# Patient Record
Sex: Male | Born: 1945 | Race: Black or African American | Hispanic: No | Marital: Single | Smoking: Former smoker
Health system: Southern US, Community
[De-identification: ages and names within clinical notes are randomized; demographics above are authoritative.]

## PROBLEM LIST (undated history)

## (undated) DIAGNOSIS — G459 Transient cerebral ischemic attack, unspecified: Secondary | ICD-10-CM

## (undated) DIAGNOSIS — Z21 Asymptomatic human immunodeficiency virus [HIV] infection status: Secondary | ICD-10-CM

## (undated) DIAGNOSIS — Z950 Presence of cardiac pacemaker: Secondary | ICD-10-CM

## (undated) DIAGNOSIS — C9000 Multiple myeloma not having achieved remission: Secondary | ICD-10-CM

## (undated) DIAGNOSIS — E785 Hyperlipidemia, unspecified: Secondary | ICD-10-CM

## (undated) DIAGNOSIS — R7303 Prediabetes: Secondary | ICD-10-CM

## (undated) DIAGNOSIS — B20 Human immunodeficiency virus [HIV] disease: Secondary | ICD-10-CM

## (undated) HISTORY — PX: CERVICAL LAMINECTOMY: SHX94

## (undated) HISTORY — DX: Multiple myeloma not having achieved remission: C90.00

## (undated) HISTORY — PX: HERNIA REPAIR: SHX51

## (undated) HISTORY — DX: Asymptomatic human immunodeficiency virus (hiv) infection status: Z21

## (undated) HISTORY — DX: Hyperlipidemia, unspecified: E78.5

## (undated) HISTORY — PX: MEDIPORT INSERTION, SINGLE: SHX2027

## (undated) HISTORY — PX: INGUINAL HERNIA REPAIR: SUR1180

## (undated) HISTORY — PX: UMBILICAL HERNIA REPAIR: SHX196

## (undated) HISTORY — DX: Human immunodeficiency virus (HIV) disease: B20

## (undated) HISTORY — PX: INSERT / REPLACE / REMOVE PACEMAKER: SUR710

---

## 2014-05-23 ENCOUNTER — Telehealth: Payer: Self-pay | Admitting: Oncology

## 2014-05-23 NOTE — Telephone Encounter (Signed)
Left pt vm in ref to np appt. °

## 2014-05-23 NOTE — Telephone Encounter (Signed)
C/D 05/23/14 for appt. 05/29/14

## 2014-05-23 NOTE — Telephone Encounter (Signed)
S/W PT IN REF TO NP APPT. ON 05/29/14@1 :30 REFERRING  DR. Nyoka Cowden DX-MULTIPLE MYELOMA MAILED NP PACKET

## 2014-05-25 ENCOUNTER — Other Ambulatory Visit: Payer: Self-pay | Admitting: Oncology

## 2014-05-25 DIAGNOSIS — D729 Disorder of white blood cells, unspecified: Secondary | ICD-10-CM

## 2014-05-29 ENCOUNTER — Telehealth: Payer: Self-pay | Admitting: Oncology

## 2014-05-29 ENCOUNTER — Ambulatory Visit: Payer: MEDICARE

## 2014-05-29 ENCOUNTER — Encounter: Payer: Self-pay | Admitting: Oncology

## 2014-05-29 ENCOUNTER — Encounter (INDEPENDENT_AMBULATORY_CARE_PROVIDER_SITE_OTHER): Payer: Self-pay

## 2014-05-29 ENCOUNTER — Other Ambulatory Visit (HOSPITAL_BASED_OUTPATIENT_CLINIC_OR_DEPARTMENT_OTHER): Payer: BC Managed Care – PPO

## 2014-05-29 ENCOUNTER — Ambulatory Visit (HOSPITAL_BASED_OUTPATIENT_CLINIC_OR_DEPARTMENT_OTHER): Payer: BC Managed Care – PPO | Admitting: Oncology

## 2014-05-29 VITALS — BP 112/65 | HR 84 | Temp 97.9°F | Resp 18 | Ht 69.0 in | Wt 143.6 lb

## 2014-05-29 DIAGNOSIS — D729 Disorder of white blood cells, unspecified: Secondary | ICD-10-CM

## 2014-05-29 DIAGNOSIS — Z21 Asymptomatic human immunodeficiency virus [HIV] infection status: Secondary | ICD-10-CM

## 2014-05-29 DIAGNOSIS — C9000 Multiple myeloma not having achieved remission: Secondary | ICD-10-CM

## 2014-05-29 LAB — CBC WITH DIFFERENTIAL/PLATELET
BASO%: 0.6 % (ref 0.0–2.0)
Basophils Absolute: 0 10*3/uL (ref 0.0–0.1)
EOS%: 0.6 % (ref 0.0–7.0)
Eosinophils Absolute: 0 10*3/uL (ref 0.0–0.5)
HCT: 36 % — ABNORMAL LOW (ref 38.4–49.9)
HGB: 12.2 g/dL — ABNORMAL LOW (ref 13.0–17.1)
LYMPH#: 2.2 10*3/uL (ref 0.9–3.3)
LYMPH%: 65.2 % — ABNORMAL HIGH (ref 14.0–49.0)
MCH: 29 pg (ref 27.2–33.4)
MCHC: 33.9 g/dL (ref 32.0–36.0)
MCV: 85.7 fL (ref 79.3–98.0)
MONO#: 0.4 10*3/uL (ref 0.1–0.9)
MONO%: 12.7 % (ref 0.0–14.0)
NEUT#: 0.7 10*3/uL — ABNORMAL LOW (ref 1.5–6.5)
NEUT%: 20.9 % — ABNORMAL LOW (ref 39.0–75.0)
NRBC: 0 % (ref 0–0)
Platelets: 158 10*3/uL (ref 140–400)
RBC: 4.2 10*6/uL (ref 4.20–5.82)
RDW: 12.7 % (ref 11.0–14.6)
WBC: 3.4 10*3/uL — AB (ref 4.0–10.3)

## 2014-05-29 LAB — COMPREHENSIVE METABOLIC PANEL (CC13)
ALBUMIN: 3.5 g/dL (ref 3.5–5.0)
ALT: 13 U/L (ref 0–55)
AST: 22 U/L (ref 5–34)
Alkaline Phosphatase: 59 U/L (ref 40–150)
Anion Gap: 6 mEq/L (ref 3–11)
BUN: 7.9 mg/dL (ref 7.0–26.0)
CO2: 27 mEq/L (ref 22–29)
CREATININE: 0.9 mg/dL (ref 0.7–1.3)
Calcium: 9.4 mg/dL (ref 8.4–10.4)
Chloride: 107 mEq/L (ref 98–109)
Glucose: 116 mg/dl (ref 70–140)
Potassium: 3.8 mEq/L (ref 3.5–5.1)
SODIUM: 140 meq/L (ref 136–145)
Total Bilirubin: 2.2 mg/dL — ABNORMAL HIGH (ref 0.20–1.20)
Total Protein: 6 g/dL — ABNORMAL LOW (ref 6.4–8.3)

## 2014-05-29 NOTE — Progress Notes (Signed)
Please see consult note.  

## 2014-05-29 NOTE — Consult Note (Signed)
Reason for Referral: Multiple myeloma.   HPI: This is a pleasant 68 year old gentleman native of the state in [place] but lived in the [place] of the majority of his life. Most recently he relocated to this area to be close to family members. He has a past medical history significant for HIV and a diagnosis of multiple myeloma dating back to 2011. His HIV has been under control with undetectable viral load and a CD4 count of 605 by last testing in 2013. His history of myeloma dates back to October of 2011 where he presented with lytic lesions after an episode of chest pain. His bone marrow biopsy in October 2011 showed plasma cell disorder that is Restricted with 84% involvement. His Levels at that time were 380 with a lambda level of 0.69. He is Out to lambda  ratio is 551. He was initially treated with Velcade-based regimen for unknown duration and subsequently established care with another oncologist in [place] area. In April 2013 his staging workup showed a kappa light chain of 26.5 and a normal quantitative immunoglobulins. A repeat bone marrow biopsy in May 2013 showed 4% residual disease. He was observed given his excellent response to low March of 2014 where he presented with an elevated free kappa light chain of 435 and a repeat bone marrow biopsy showed 61% plasma cells. At that time, he was treated with Velcade Revlimid and dexamethasone starting on April 2014. He had significant improvement in his symptoms and his free kappa light chain drop 2119 after the first cycle. In subsequent cycles his kappa free light chain drop 2 close to 50 mg/dL he did not tolerate this combination very well and was switched to Revlimid monotherapy at a lower dose of 50 mg daily for 21 days. He continues not to have toxicities predominantly diarrhea and fatigue and his most recent kappa free light chain was around 70 mg/dL. His oncologist at that time decided to proceed with maintenance Revlimid and the  patient decided to relocate to this area. He had established care with primary care physician and he has a referral to infectious disease.  Clinically he is asymptomatic at this time. He does not report any headaches or blurry vision or double vision. Does not report any syncope. He is not reporting any fevers or chills or sweats. Does not report any weight loss or appetite changes. He does not report any nausea or vomiting or abdominal pain. Does not report any diarrhea hematochezia or melena. Does not report any chest pain palpitation or shortness of breath. 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 He does not report any frequency urgency or hesitancy. Does not report any hematuria or dysuria. He does not report any skeletal complaints. He does report chronic back pain related to his previous laminectomy. He does not report any arthralgias. She does not report any issues or lymphadenopathy. Overall he has excellent performance status and quality of life.   Past Medical History  Diagnosis Date  . Multiple myeloma 2011  . HIV infection   . Hyperlipidemia   :  Past Surgical History  Procedure Laterality Date  . Neck surgery    . Pace maker    :  Current Outpatient Prescriptions  Medication Sig Dispense Refill  . atazanavir (REYATAZ) 300 MG capsule Take 300 mg by mouth daily with breakfast.      . clopidogrel (PLAVIX) 75 MG tablet Take 75 mg by mouth daily.      Marland Kitchen  emtricitabine-tenofovir (TRUVADA) 200-300 MG per tablet Take 1 tablet by mouth daily.      Marland Kitchen HYDROcodone-acetaminophen (NORCO) 10-325 MG per tablet Take 1 tablet by mouth every 6 (six) hours as needed.      . ritonavir (NORVIR) 100 MG TABS tablet Take 100 mg by mouth daily with breakfast.       No current facility-administered medications for this visit.       No Known Allergies:   his family history reviewed with the patient and no history of malignancy or blood disorders.   History   Social  History  . Marital Status: Single    Spouse Name: N/A    Number of Children: N/A  . Years of Education: N/A   Occupational History  . Not on file.   Social History Main Topics  . Smoking status: Former Smoker    Types: Cigarettes  . Smokeless tobacco: Not on file  . Alcohol Use: Yes  . Drug Use: Not on file  . Sexual Activity: Not on file   Other Topics Concern  . Not on file   Social History Narrative  . No narrative on file  :  Pertinent items are noted in HPI.  Exam: ECOG 0 Blood pressure 112/65, pulse 84, temperature 97.9 F (36.6 C), temperature source Oral, resp. rate 18, height 5' 9"  (1.753 m), weight 143 lb 9.6 oz (65.137 kg). General appearance: alert, cooperative and appears stated age Head: Normocephalic, without obvious abnormality Throat: lips, mucosa, and tongue normal; teeth and gums normal Neck: no adenopathy Back: symmetric, no curvature. ROM normal. No CVA tenderness. Resp: clear to auscultation bilaterally Chest wall: no tenderness Cardio: regular rate and rhythm, S1, S2 normal, no murmur, click, rub or gallop GI: soft, non-tender; bowel sounds normal; no masses,  no organomegaly Extremities: extremities normal, atraumatic, no cyanosis or edema Pulses: 2+ and symmetric Skin: Skin color, texture, turgor normal. No rashes or lesions Lymph nodes: Cervical, supraclavicular, and axillary nodes normal. Neurologic: Grossly normal   Recent Labs  05/29/14 1320  WBC 3.4*  HGB 12.2*  HCT 36.0*  PLT 158    Recent Labs  05/29/14 1320  NA 140  K 3.8  CO2 27  GLUCOSE 116  BUN 7.9  CREATININE 0.9  CALCIUM 9.4     No results found.  Assessment and Plan:    67 year old gentleman with the following issues:  1. 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 He presented with lytic bone lesions and a bone marrow involvement with 84% plasma cells. His kappa free light chain was around 318. He does not have an M spike or heavy  chain detected at any point. He was treated in the [facility] area initially with a Velcade-based regimen and had an excellent response with about 4% bone marrow residual disease. In April of 2014 he developed relapsed and was treated with Revlimid, Velcade and dexamethasone regimen. He had an excellent response with his kappa free light chain close to 60 mg/dL. He is planned to have maintenance Velcade treatment even his poor tolerance to Revlimid. He recently relocated to [place] to be close to family.  The natural course of this disease was discussed with this gentleman. I explained to him that this is an incurable malignancy but certainly treatable at this time. Given the fact that he does not have any symptoms and have very good partial response with the previous regimen options of treatments were discussed today. We can use to Revlimid base  maintenance, a Velcade base maintenance or leave him off therapy altogether and treat upon recurrence. 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  The plan will be at this point is to put him on close observation and surveillance and if his light chain start to rise or he develops symptoms then we'll restage him at that time and will use reinduction regimen at that time. This can be Velcade or equivalent. I will assess him in 6 weeks and recheck his light chains at that time.  2. Bone directed therapy: He has had extensive Zometa for the last 2 years and I will hold off on any further bone directed therapy unless he have developed new lytic bone lesions.  3. History of HIV: He is currently on antiretrovirals medications and he will establish care with infectious disease in the near future.

## 2014-05-29 NOTE — Progress Notes (Signed)
Checked in new patient with no financial issues. He has appt card and has not been out of the country.

## 2014-05-29 NOTE — Telephone Encounter (Signed)
gv and printed appt sched and avs for pt for Aug °

## 2014-06-03 LAB — SPEP & IFE WITH QIG
ALBUMIN ELP: 65.8 % (ref 55.8–66.1)
ALPHA-1-GLOBULIN: 3.9 % (ref 2.9–4.9)
Alpha-2-Globulin: 8.8 % (ref 7.1–11.8)
Beta 2: 3.5 % (ref 3.2–6.5)
Beta Globulin: 6.6 % (ref 4.7–7.2)
Gamma Globulin: 11.4 % (ref 11.1–18.8)
IGM, SERUM: 12 mg/dL — AB (ref 41–251)
IgA: 34 mg/dL — ABNORMAL LOW (ref 68–379)
IgG (Immunoglobin G), Serum: 784 mg/dL (ref 650–1600)
M-Spike, %: 0.14 g/dL
Total Protein, Serum Electrophoresis: 5.9 g/dL — ABNORMAL LOW (ref 6.0–8.3)

## 2014-06-03 LAB — KAPPA/LAMBDA LIGHT CHAINS
KAPPA LAMBDA RATIO: 86.6 — AB (ref 0.26–1.65)
Kappa free light chain: 89.2 mg/dL — ABNORMAL HIGH (ref 0.33–1.94)
Lambda Free Lght Chn: 1.03 mg/dL (ref 0.57–2.63)

## 2014-06-11 ENCOUNTER — Ambulatory Visit (INDEPENDENT_AMBULATORY_CARE_PROVIDER_SITE_OTHER): Payer: MEDICARE

## 2014-06-11 DIAGNOSIS — B20 Human immunodeficiency virus [HIV] disease: Secondary | ICD-10-CM

## 2014-06-11 DIAGNOSIS — Z21 Asymptomatic human immunodeficiency virus [HIV] infection status: Secondary | ICD-10-CM

## 2014-06-11 DIAGNOSIS — Z23 Encounter for immunization: Secondary | ICD-10-CM

## 2014-06-11 DIAGNOSIS — C9000 Multiple myeloma not having achieved remission: Secondary | ICD-10-CM

## 2014-06-11 LAB — COMPLETE METABOLIC PANEL WITH GFR
ALBUMIN: 4.4 g/dL (ref 3.5–5.2)
ALT: 15 U/L (ref 0–53)
AST: 24 U/L (ref 0–37)
Alkaline Phosphatase: 61 U/L (ref 39–117)
BILIRUBIN TOTAL: 1.6 mg/dL — AB (ref 0.2–1.2)
BUN: 8 mg/dL (ref 6–23)
CHLORIDE: 105 meq/L (ref 96–112)
CO2: 29 mEq/L (ref 19–32)
Calcium: 9.8 mg/dL (ref 8.4–10.5)
Creat: 0.81 mg/dL (ref 0.50–1.35)
GFR, Est African American: >89 mL/min
Glucose, Bld: 107 mg/dL — ABNORMAL HIGH (ref 70–99)
POTASSIUM: 3.9 meq/L (ref 3.5–5.3)
SODIUM: 141 meq/L (ref 135–145)
TOTAL PROTEIN: 6.3 g/dL (ref 6.0–8.3)

## 2014-06-11 LAB — LIPID PANEL
Cholesterol: 201 mg/dL — ABNORMAL HIGH (ref 0–200)
HDL: 59 mg/dL (ref >39)
LDL Cholesterol: 124 mg/dL — ABNORMAL HIGH (ref 0–99)
Total CHOL/HDL Ratio: 3.4 Ratio
Triglycerides: 90 mg/dL (ref <150)
VLDL: 18 mg/dL (ref 0–40)

## 2014-06-11 MED ORDER — ATAZANAVIR SULFATE 300 MG PO CAPS: 300.0000 mg | ORAL_CAPSULE | Freq: Every day | ORAL | Status: DC

## 2014-06-11 MED ORDER — EMTRICITABINE-TENOFOVIR DF 200-300 MG PO TABS: 1.0000 | ORAL_TABLET | Freq: Every day | ORAL | Status: DC

## 2014-06-11 MED ORDER — RITONAVIR 100 MG PO TABS: 100.0000 mg | ORAL_TABLET | Freq: Every day | ORAL | Status: DC

## 2014-06-11 NOTE — Patient Instructions (Signed)
Please call the office at [phone] on 06/13/14 to give [place] the results of your TB skin test

## 2014-06-11 NOTE — Progress Notes (Signed)
68 year old AA male here for new HIV intake, he is transferring from [facility] where he received care. Patient has currently been on Norvir, Truvada, and Reyataz for the past 10 years. His last CD 4 was 499 and viral load was undetectable. Patient is retired and lives in [place], and reports taking his medication everyday. He has several health problems that he has established a primary care doctor to address, he was seen at [facility] in June. Patient was concerned today about running out of medication, he states he only had 1 week left. I called a refill in to his pharmacy. Patient's vaccine was updated and records received. Patient does not have any tattoos, but has 1 ear piercing.

## 2014-06-12 LAB — CBC WITH DIFFERENTIAL/PLATELET
BASOS ABS: 0 10*3/uL (ref 0.0–0.1)
Basophils Relative: 1 % (ref 0–1)
EOS ABS: 0.1 10*3/uL (ref 0.0–0.7)
EOS PCT: 2 % (ref 0–5)
HCT: 36.8 % — ABNORMAL LOW (ref 39.0–52.0)
HEMOGLOBIN: 12.8 g/dL — AB (ref 13.0–17.0)
Lymphocytes Relative: 65 % — ABNORMAL HIGH (ref 12–46)
Lymphs Abs: 1.8 10*3/uL (ref 0.7–4.0)
MCH: 28.8 pg (ref 26.0–34.0)
MCHC: 34.8 g/dL (ref 30.0–36.0)
MCV: 82.7 fL (ref 78.0–100.0)
MONOS PCT: 13 % — AB (ref 3–12)
Monocytes Absolute: 0.4 10*3/uL (ref 0.1–1.0)
Neutro Abs: 0.5 10*3/uL — ABNORMAL LOW (ref 1.7–7.7)
Neutrophils Relative %: 19 % — ABNORMAL LOW (ref 43–77)
PLATELETS: 156 10*3/uL (ref 150–400)
RBC: 4.45 MIL/uL (ref 4.22–5.81)
RDW: 13.9 % (ref 11.5–15.5)
WBC: 2.7 10*3/uL — AB (ref 4.0–10.5)

## 2014-06-12 LAB — URINALYSIS
BILIRUBIN URINE: NEGATIVE
Glucose, UA: NEGATIVE mg/dL
Hgb urine dipstick: NEGATIVE
Ketones, ur: NEGATIVE mg/dL
Leukocytes, UA: NEGATIVE
NITRITE: NEGATIVE
PH: 6 (ref 5.0–8.0)
Protein, ur: NEGATIVE mg/dL
Specific Gravity, Urine: 1.013 (ref 1.005–1.030)
Urobilinogen, UA: 1 mg/dL (ref 0.0–1.0)

## 2014-06-12 LAB — T-HELPER CELL (CD4) - (RCID CLINIC ONLY)
CD4 T CELL HELPER: 20 % — AB (ref 33–55)
CD4 T Cell Abs: 370 /uL — ABNORMAL LOW (ref 400–2700)

## 2014-06-12 LAB — RPR

## 2014-06-12 LAB — HEPATITIS B CORE ANTIBODY, TOTAL: HEP B C TOTAL AB: REACTIVE — AB

## 2014-06-12 LAB — HEPATITIS C ANTIBODY: HCV Ab: NEGATIVE

## 2014-06-12 LAB — HEPATITIS B SURFACE ANTIGEN: Hepatitis B Surface Ag: NEGATIVE

## 2014-06-12 LAB — PATHOLOGIST SMEAR REVIEW

## 2014-06-12 LAB — HEPATITIS A ANTIBODY, TOTAL: Hep A Total Ab: REACTIVE — AB

## 2014-06-12 LAB — HEPATITIS B SURFACE ANTIBODY,QUALITATIVE: HEP B S AB: POSITIVE — AB

## 2014-06-13 LAB — HIV-1 RNA ULTRAQUANT REFLEX TO GENTYP+
HIV 1 RNA Quant: <20 copies/mL (ref <20)
HIV-1 RNA Quant, Log: <1.3 {Log} (ref <1.30)

## 2014-06-14 LAB — TB SKIN TEST
Induration: 0 mm
TB Skin Test: NEGATIVE

## 2014-06-17 LAB — HLA B*5701: HLA-B*5701 w/rflx HLA-B High: NEGATIVE

## 2014-07-01 ENCOUNTER — Encounter: Payer: Self-pay | Admitting: Internal Medicine

## 2014-07-01 ENCOUNTER — Ambulatory Visit (INDEPENDENT_AMBULATORY_CARE_PROVIDER_SITE_OTHER): Payer: BC Managed Care – PPO | Admitting: Internal Medicine

## 2014-07-01 VITALS — BP 115/76 | HR 102 | Temp 98.2°F | Ht 69.0 in | Wt 143.8 lb

## 2014-07-01 DIAGNOSIS — B20 Human immunodeficiency virus [HIV] disease: Secondary | ICD-10-CM | POA: Insufficient documentation

## 2014-07-01 DIAGNOSIS — IMO0002 Reserved for concepts with insufficient information to code with codable children: Secondary | ICD-10-CM | POA: Insufficient documentation

## 2014-07-01 DIAGNOSIS — M1990 Unspecified osteoarthritis, unspecified site: Secondary | ICD-10-CM | POA: Insufficient documentation

## 2014-07-01 DIAGNOSIS — C9002 Multiple myeloma in relapse: Secondary | ICD-10-CM

## 2014-07-01 DIAGNOSIS — Z9889 Other specified postprocedural states: Secondary | ICD-10-CM

## 2014-07-01 DIAGNOSIS — G459 Transient cerebral ischemic attack, unspecified: Secondary | ICD-10-CM | POA: Insufficient documentation

## 2014-07-01 DIAGNOSIS — Z950 Presence of cardiac pacemaker: Secondary | ICD-10-CM | POA: Insufficient documentation

## 2014-07-01 NOTE — Progress Notes (Signed)
Rfv: establishing care transfer from out of state Subjective:    Patient ID: Rick Potter, male    DOB: [birth year], 68 y.o.   MRN: [id number]  HPI 68yo M with HIV disease dx in 1990, well controlled, 370/VL<20 on truvada-boosted atazanavir regimen which he has been for roughly 5 years. Immune hep A and hep B. He is in good state of health despite in MM. Presently asymptomatic from MM. He does have some arthritis. He reports being under good control of his hiv disease. He is interested in going down to 1 pill per day regimen   MM, in 0ct 2011, previously on velcade, zometa monthy, which he did not tolerate. Just establishing care with Dr. Alen Blew for MM and determining when to initaite treatment. No Known Allergies Current Outpatient Prescriptions on File Prior to Visit  Medication Sig Dispense Refill  . atazanavir (REYATAZ) 300 MG capsule Take 1 capsule (300 mg total) by mouth daily with breakfast.  30 capsule  0  . clopidogrel (PLAVIX) 75 MG tablet Take 75 mg by mouth daily.      Marland Kitchen emtricitabine-tenofovir (TRUVADA) 200-300 MG per tablet Take 1 tablet by mouth daily.  30 tablet  0  . HYDROcodone-acetaminophen (NORCO) 10-325 MG per tablet Take 1 tablet by mouth every 6 (six) hours as needed.      . ritonavir (NORVIR) 100 MG TABS tablet Take 1 tablet (100 mg total) by mouth daily with breakfast.  30 tablet  0   No current facility-administered medications on file prior to visit.   Active Ambulatory Problems    Diagnosis Date Noted  . No Active Ambulatory Problems   Resolved Ambulatory Problems    Diagnosis Date Noted  . No Resolved Ambulatory Problems   Past Medical History  Diagnosis Date  . Multiple myeloma 2011  . HIV infection   . Hyperlipidemia    History  Substance Use Topics  . Smoking status: Former Smoker    Types: Cigarettes    Quit date: 12/06/1997  . Smokeless tobacco: Not on file  . Alcohol Use: No  family history includes Alcohol abuse in his brother; Cancer in  his sister; Diabetes in his brother; Stroke in his mother. brother that died of AIDS at age of 64. Sister with meylomas.    Review of Systems Review of Systems  Constitutional: Negative for fever, chills, diaphoresis, activity change, appetite change, fatigue and unexpected weight change.  HENT: Negative for congestion, sore throat, rhinorrhea, sneezing, trouble swallowing and sinus pressure.  Eyes: Negative for photophobia and visual disturbance.  Respiratory: Negative for cough, chest tightness, shortness of breath, wheezing and stridor.  Cardiovascular: Negative for chest pain, palpitations and leg swelling.  Gastrointestinal: Negative for nausea, vomiting, abdominal pain, diarrhea, constipation, blood in stool, abdominal distention and anal bleeding.  Genitourinary: Negative for dysuria, hematuria, flank pain and difficulty urinating.  Musculoskeletal: Negative for myalgias, back pain, joint swelling, arthralgias and gait problem.  Skin: Negative for color change, pallor, rash and wound.  Neurological: Negative for dizziness, tremors, weakness and light-headedness.  Hematological: Negative for adenopathy. Does not bruise/bleed easily.  Psychiatric/Behavioral: Negative for behavioral problems, confusion, sleep disturbance, dysphoric mood, decreased concentration and agitation.       Objective:   Physical Exam BP 115/76  Pulse 102  Temp(Src) 98.2 F (36.8 C) (Oral)  Ht 5' 9"  (1.753 m)  Wt 143 lb 12 oz (65.205 kg)  BMI 21.22 kg/m2 Physical Exam  Constitutional: He is oriented to person, place, and time. He appears well-developed  and well-nourished. No distress.  HENT:  Mouth/Throat: Oropharynx is clear and moist. No oropharyngeal exudate.  Cardiovascular: Normal rate, regular rhythm and normal heart sounds. Exam reveals no gallop and no friction rub.  No murmur heard.  Pulmonary/Chest: Effort normal and breath sounds normal. No respiratory distress. He has no wheezes.  Abdominal:  Soft. Bowel sounds are normal. He exhibits no distension. There is no tenderness.  Lymphadenopathy:  He has no cervical adenopathy.  Neurological: He is alert and oriented to person, place, and time.  Skin: Skin is warm and dry. No rash noted. No erythema.  Psychiatric: He has a normal mood and affect. His behavior is normal.       Assessment & Plan:  hiv = well controlled. Potentially could do stribild. He denies any use of the Integrase Inhibitors. He has excellent adherence. We will check his old genotypes from his records to see if can make the switch. One other alternative is to switch him truvada plus evotaz or prezcobix.  Multiple myeloma = he has appt with dr. Alen Blew on the 7th. We will see what comes of that appt and if he were to resume any chemo treatment. He does have detectable free kappa light chains on latest testing but unclear if it meets threshold for treatment.  Health prevention = we will see if he has recent colonoscopy  Spent 45 min with patient with greater than 50% review records  rtc in 6 wk

## 2014-07-12 ENCOUNTER — Encounter: Payer: Self-pay | Admitting: Oncology

## 2014-07-12 ENCOUNTER — Other Ambulatory Visit (HOSPITAL_BASED_OUTPATIENT_CLINIC_OR_DEPARTMENT_OTHER): Payer: BC Managed Care – PPO

## 2014-07-12 ENCOUNTER — Ambulatory Visit (HOSPITAL_BASED_OUTPATIENT_CLINIC_OR_DEPARTMENT_OTHER): Payer: BC Managed Care – PPO | Admitting: Oncology

## 2014-07-12 ENCOUNTER — Telehealth: Payer: Self-pay | Admitting: Oncology

## 2014-07-12 VITALS — BP 115/61 | HR 98 | Temp 97.8°F | Resp 20 | Ht 69.0 in | Wt 144.5 lb

## 2014-07-12 DIAGNOSIS — C9000 Multiple myeloma not having achieved remission: Secondary | ICD-10-CM

## 2014-07-12 DIAGNOSIS — C9002 Multiple myeloma in relapse: Secondary | ICD-10-CM

## 2014-07-12 DIAGNOSIS — B20 Human immunodeficiency virus [HIV] disease: Secondary | ICD-10-CM

## 2014-07-12 LAB — CBC WITH DIFFERENTIAL/PLATELET
BASO%: 0.7 % (ref 0.0–2.0)
Basophils Absolute: 0 10*3/uL (ref 0.0–0.1)
EOS%: 2 % (ref 0.0–7.0)
Eosinophils Absolute: 0.1 10*3/uL (ref 0.0–0.5)
HCT: 40.7 % (ref 38.4–49.9)
HGB: 13 g/dL (ref 13.0–17.1)
LYMPH%: 66.5 % — AB (ref 14.0–49.0)
MCH: 27.5 pg (ref 27.2–33.4)
MCHC: 31.9 g/dL — AB (ref 32.0–36.0)
MCV: 86.2 fL (ref 79.3–98.0)
MONO#: 0.3 10*3/uL (ref 0.1–0.9)
MONO%: 10.7 % (ref 0.0–14.0)
NEUT#: 0.6 10*3/uL — ABNORMAL LOW (ref 1.5–6.5)
NEUT%: 20.1 % — ABNORMAL LOW (ref 39.0–75.0)
PLATELETS: 180 10*3/uL (ref 140–400)
RBC: 4.72 10*6/uL (ref 4.20–5.82)
RDW: 13.4 % (ref 11.0–14.6)
WBC: 2.9 10*3/uL — ABNORMAL LOW (ref 4.0–10.3)
lymph#: 1.9 10*3/uL (ref 0.9–3.3)

## 2014-07-12 LAB — COMPREHENSIVE METABOLIC PANEL (CC13)
ALT: 20 U/L (ref 0–55)
ANION GAP: 8 meq/L (ref 3–11)
AST: 28 U/L (ref 5–34)
Albumin: 3.8 g/dL (ref 3.5–5.0)
Alkaline Phosphatase: 79 U/L (ref 40–150)
BILIRUBIN TOTAL: 3.27 mg/dL — AB (ref 0.20–1.20)
BUN: 9.8 mg/dL (ref 7.0–26.0)
CO2: 26 mEq/L (ref 22–29)
CREATININE: 0.9 mg/dL (ref 0.7–1.3)
Calcium: 10.1 mg/dL (ref 8.4–10.4)
Chloride: 106 mEq/L (ref 98–109)
GLUCOSE: 153 mg/dL — AB (ref 70–140)
Potassium: 3.9 mEq/L (ref 3.5–5.1)
Sodium: 139 mEq/L (ref 136–145)
Total Protein: 6.5 g/dL (ref 6.4–8.3)

## 2014-07-12 MED ORDER — HEPARIN SOD (PORK) LOCK FLUSH 100 UNIT/ML IV SOLN
500.0000 [IU] | Freq: Once | INTRAVENOUS | Status: AC
  Administered 2014-07-12: 500 [IU] via INTRAVENOUS
  Filled 2014-07-12: qty 5

## 2014-07-12 MED ORDER — SODIUM CHLORIDE 0.9 % IJ SOLN
10.0000 mL | INTRAMUSCULAR | Status: DC | PRN
  Administered 2014-07-12: 10 mL via INTRAVENOUS
  Filled 2014-07-12: qty 10

## 2014-07-12 NOTE — Progress Notes (Signed)
Hematology and Oncology Follow Up Visit  Rick Potter [id number] Nov 12, 1946 68 y.o. 07/12/2014 10:27 AM Charma Igo, Mauro Kaufmann, MD   Principle Diagnosis: 68 year old gentleman diagnosed with kappa light chain multiple myeloma in October of 2011. He presented with lytic bone lesions and 84% plasma subtle filtration in his bone marrow. No heavy chain was detected. He also has HIV disease under control.  Prior Therapy: He was treated in the [facility] area initially with a Velcade-based regimen and had an excellent response with about 4% bone marrow residual disease. In April of 2014 he developed relapsed and was treated with Revlimid, Velcade and dexamethasone regimen. He had an excellent response with his kappa free light chain close to 60 mg/dL. He is planned to have maintenance Velcade treatment even his poor tolerance to Revlimid. He recently relocated to [place] to be close to family.   Current therapy: Observation and surveillance.  Interim History:  Mr. Coury presents today for a followup visit. Since his last visit, he continues to be asymptomatic. He has not reported any constitutional symptoms of fevers or chills or sweats. Has not reported any weight loss or appetite changes. Is not reporting any headaches or blurry vision or syncope. Is not reporting any chest pain or shortness of breath or cough or hemoptysis. Does not report any nausea or vomiting abdominal pain. Has not reported any hematochezia or melena. Spent for any frequency urgency or hesitancy. He continues to have excellent performance status and quality of life. Rest of his review of systems unremarkable.  Medications: I have reviewed the patient's current medications.  Current Outpatient Prescriptions  Medication Sig Dispense Refill  . atazanavir (REYATAZ) 300 MG capsule Take 1 capsule (300 mg total) by mouth daily with breakfast.  30 capsule  0  . clopidogrel (PLAVIX) 75 MG tablet Take 75 mg by  mouth daily.      Marland Kitchen emtricitabine-tenofovir (TRUVADA) 200-300 MG per tablet Take 1 tablet by mouth daily.  30 tablet  0  . HYDROcodone-acetaminophen (NORCO) 10-325 MG per tablet Take 1 tablet by mouth every 6 (six) hours as needed.      . ritonavir (NORVIR) 100 MG TABS tablet Take 1 tablet (100 mg total) by mouth daily with breakfast.  30 tablet  0   No current facility-administered medications for this visit.     Allergies: No Known Allergies  Past Medical History, Surgical history, Social history, and Family History were reviewed and updated.   Physical Exam: Blood pressure 115/61, pulse 98, temperature 97.8 F (36.6 C), temperature source Oral, resp. rate 20, height _0  (1.753 m), weight 144 lb 8 oz (65.545 kg), SpO2 100.00%. ECOG:  General appearance: alert and cooperative Head: Normocephalic, without obvious abnormality Neck: no adenopathy Lymph nodes: Cervical, supraclavicular, and axillary nodes normal. Heart:regular rate and rhythm, S1, S2 normal, no murmur, click, rub or gallop Lung:chest clear, no wheezing, rales, normal symmetric air entry Abdomin: soft, non-tender, without masses or organomegaly EXT:no erythema, induration, or nodules   Lab Results: Lab Results  Component Value Date   WBC 2.9* 07/12/2014   HGB 13.0 07/12/2014   HCT 40.7 07/12/2014   MCV 86.2 07/12/2014   PLT 180 07/12/2014     Chemistry      Component Value Date/Time   NA 139 07/12/2014 0944   NA 141 06/11/2014 0952   K 3.9 07/12/2014 0944   K 3.9 06/11/2014 0952   CL 105 06/11/2014 0952   CO2 26 07/12/2014 0944   CO2 29 06/11/2014 4008  BUN 9.8 07/12/2014 0944   BUN 8 06/11/2014 0952   CREATININE 0.9 07/12/2014 0944   CREATININE 0.81 06/11/2014 0952      Component Value Date/Time   CALCIUM 10.1 07/12/2014 0944   CALCIUM 9.8 06/11/2014 0952   ALKPHOS 79 07/12/2014 0944   ALKPHOS 61 06/11/2014 0952   AST 28 07/12/2014 0944   AST 24 06/11/2014 0952   ALT 20 07/12/2014 0944   ALT 15 06/11/2014 0952   BILITOT 3.27* 07/12/2014  0944   BILITOT 1.6* 06/11/2014 0952       Impression and Plan:  68 year old gentleman with the following issues:   1. 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 He is status post of Velcade-based regimen and subsequently received Velcade Revlimid decks and tolerated it poorly. His most recent protein studies showed his kappa free light chain around 89 which is close to his baseline response after chemotherapy. Risks and benefits of starting chemotherapy now versus at progression was discussed today. He continues to prefer deferring chemotherapy total symptoms arise. I will continue to monitor him closely with protein studies and any signs of end organ damage and Institute chemotherapy at that time.  2. HIV disease: Under reasonable control followed by infectious disease.  3. Port-A-Cath management: it will be flushed every 2 months.   [facility], MD 8/7/201510:27 AM

## 2014-07-12 NOTE — Telephone Encounter (Signed)
gv and printed appt sched adn avs for pt fro OCT

## 2014-07-16 ENCOUNTER — Other Ambulatory Visit: Payer: Self-pay | Admitting: Licensed Clinical Social Worker

## 2014-07-16 DIAGNOSIS — B20 Human immunodeficiency virus [HIV] disease: Secondary | ICD-10-CM

## 2014-07-16 LAB — SPEP & IFE WITH QIG
ALPHA-2-GLOBULIN: 8.7 % (ref 7.1–11.8)
Albumin ELP: 65.7 % (ref 55.8–66.1)
Alpha-1-Globulin: 3.6 % (ref 2.9–4.9)
Beta 2: 3.5 % (ref 3.2–6.5)
Beta Globulin: 7 % (ref 4.7–7.2)
GAMMA GLOBULIN: 11.5 % (ref 11.1–18.8)
IGG (IMMUNOGLOBIN G), SERUM: 883 mg/dL (ref 650–1600)
IgA: 34 mg/dL — ABNORMAL LOW (ref 68–379)
IgM, Serum: 19 mg/dL — ABNORMAL LOW (ref 41–251)
M-Spike, %: 0.16 g/dL
Total Protein, Serum Electrophoresis: 6.2 g/dL (ref 6.0–8.3)

## 2014-07-16 LAB — KAPPA/LAMBDA LIGHT CHAINS
KAPPA FREE LGHT CHN: 107 mg/dL — AB (ref 0.33–1.94)
KAPPA LAMBDA RATIO: 107 — AB (ref 0.26–1.65)
Lambda Free Lght Chn: 1 mg/dL (ref 0.57–2.63)

## 2014-07-16 MED ORDER — RITONAVIR 100 MG PO TABS: 100.0000 mg | ORAL_TABLET | Freq: Every day | ORAL | Status: DC

## 2014-07-16 MED ORDER — EMTRICITABINE-TENOFOVIR DF 200-300 MG PO TABS: 1.0000 | ORAL_TABLET | Freq: Every day | ORAL | Status: DC

## 2014-07-16 MED ORDER — ATAZANAVIR SULFATE 300 MG PO CAPS: 300.0000 mg | ORAL_CAPSULE | Freq: Every day | ORAL | Status: DC

## 2014-08-19 ENCOUNTER — Other Ambulatory Visit: Payer: MEDICARE

## 2014-08-19 DIAGNOSIS — B20 Human immunodeficiency virus [HIV] disease: Secondary | ICD-10-CM

## 2014-08-20 ENCOUNTER — Encounter: Payer: MEDICARE | Attending: Internal Medicine

## 2014-08-20 VITALS — Ht 69.0 in | Wt 140.0 lb

## 2014-08-20 DIAGNOSIS — E119 Type 2 diabetes mellitus without complications: Secondary | ICD-10-CM | POA: Diagnosis present

## 2014-08-20 DIAGNOSIS — Z713 Dietary counseling and surveillance: Secondary | ICD-10-CM | POA: Diagnosis not present

## 2014-08-20 LAB — HIV-1 RNA QUANT-NO REFLEX-BLD
HIV 1 RNA Quant: <20 copies/mL (ref <20)
HIV-1 RNA Quant, Log: <1.3 {Log} (ref <1.30)

## 2014-08-20 LAB — T-HELPER CELL (CD4) - (RCID CLINIC ONLY)
CD4 T CELL HELPER: 18 % — AB (ref 33–55)
CD4 T Cell Abs: 390 /uL — ABNORMAL LOW (ref 400–2700)

## 2014-08-22 NOTE — Progress Notes (Signed)
Patient was seen on 08/20/14 for the first of a series of three diabetes self-management courses at [facility].  Patient Education Plan per assessed needs and concerns is to attend four course education program for Diabetes Self Management Education.  Current HbA1c: 7.1%  The following learning objectives were met by the patient during this class:  Describe diabetes  State some common risk factors for diabetes  Defines the role of glucose and insulin  Identifies type of diabetes and pathophysiology  Describe the relationship between diabetes and cardiovascular risk  State the members of the Healthcare Team  States the rationale for glucose monitoring  State when to test glucose  State their individual Target Range  State the importance of logging glucose readings  Describe how to interpret glucose readings  Identifies A1C target  Explain the correlation between A1c and eAG values  State symptoms and treatment of high blood glucose  State symptoms and treatment of low blood glucose  Explain proper technique for glucose testing  Identifies proper sharps disposal  Handouts given during class include:  Living Well with Diabetes book  Carb Counting and Meal Planning book  Meal Plan Card  Carbohydrate guide  Meal planning worksheet  Low Sodium Flavoring Tips  The diabetes portion plate  F7T to eAG Conversion Chart  Diabetes Medications  Diabetes Recommended Care Schedule  Support Group  Diabetes Success Plan  Core Class Satisfaction Survey  Follow-Up Plan:  Attend core 2

## 2014-08-27 ENCOUNTER — Ambulatory Visit: Payer: BC Managed Care – PPO

## 2014-09-02 ENCOUNTER — Encounter: Payer: Self-pay | Admitting: Internal Medicine

## 2014-09-02 ENCOUNTER — Ambulatory Visit (INDEPENDENT_AMBULATORY_CARE_PROVIDER_SITE_OTHER): Payer: BC Managed Care – PPO | Admitting: Internal Medicine

## 2014-09-02 VITALS — BP 124/82 | HR 91 | Temp 97.4°F | Wt 142.0 lb

## 2014-09-02 DIAGNOSIS — C9000 Multiple myeloma not having achieved remission: Secondary | ICD-10-CM

## 2014-09-02 DIAGNOSIS — Z21 Asymptomatic human immunodeficiency virus [HIV] infection status: Secondary | ICD-10-CM

## 2014-09-02 DIAGNOSIS — Z23 Encounter for immunization: Secondary | ICD-10-CM

## 2014-09-02 DIAGNOSIS — B20 Human immunodeficiency virus [HIV] disease: Secondary | ICD-10-CM

## 2014-09-02 MED ORDER — ATAZANAVIR-COBICISTAT 300-150 MG PO TABS: 1.0000 | ORAL_TABLET | Freq: Every day | ORAL | Status: DC

## 2014-09-02 MED ORDER — EMTRICITABINE-TENOFOVIR DF 200-300 MG PO TABS: 1.0000 | ORAL_TABLET | Freq: Every day | ORAL | Status: AC

## 2014-09-02 NOTE — Progress Notes (Signed)
Patient ID: Rick Potter, male   DOB: 07-21-1946, 68 y.o.   MRN: [id number]       Patient ID: Rick Potter, male   DOB: Dec 20, 1945, 68 y.o.   MRN: [id number]  HPI  67 year old gentleman with well controlled HIV disease, CD 4 count 380/VL<20, on truvada/ATVr, but also diagnosed with kappa light chain multiple myeloma in October of 2011. He presented with lytic bone lesions and 84% plasma subtle filtration in his bone marrow. No heavy chain was detected.  S/p velcade treatment but did not tolerate revlmid. He saws oncology who will be following him to see when to re-initiate chemotherapy   Outpatient Encounter Prescriptions as of 09/02/2014  Medication Sig  . atazanavir (REYATAZ) 300 MG capsule Take 1 capsule (300 mg total) by mouth daily with breakfast.  . clopidogrel (PLAVIX) 75 MG tablet Take 75 mg by mouth daily.  Marland Kitchen emtricitabine-tenofovir (TRUVADA) 200-300 MG per tablet Take 1 tablet by mouth daily.  Marland Kitchen HYDROcodone-acetaminophen (NORCO) 10-325 MG per tablet Take 1 tablet by mouth every 6 (six) hours as needed.  . ritonavir (NORVIR) 100 MG TABS tablet Take 1 tablet (100 mg total) by mouth daily with breakfast.     Patient Active Problem List   Diagnosis Date Noted  . HIV disease 07/01/2014  . Multiple myeloma in relapse 07/01/2014  . TIA (transient ischemic attack) 07/01/2014  . DDD (degenerative disc disease) 07/01/2014  . OA (osteoarthritis) 07/01/2014  . Pacemaker 07/01/2014  . History of colonoscopy 07/01/2014  . History of laminectomy 07/01/2014     Health Maintenance Due  Topic Date Due  . Hemoglobin A1c  10-06-1946  . Foot Exam  04/09/1958  . Ophthalmology Exam  04/09/1958  . Urine Microalbumin  04/09/1958  . Tetanus/tdap  04/10/1967  . Colonoscopy  04/09/1998  . Zostavax  04/09/2008  . Influenza Vaccine  07/06/2014     Review of Systems Review of Systems  Constitutional: Negative for fever, chills, diaphoresis, activity change, appetite change, fatigue and  unexpected weight change.  HENT: Negative for congestion, sore throat, rhinorrhea, sneezing, trouble swallowing and sinus pressure.  Eyes: Negative for photophobia and visual disturbance.  Respiratory: Negative for cough, chest tightness, shortness of breath, wheezing and stridor.  Cardiovascular: Negative for chest pain, palpitations and leg swelling.  Gastrointestinal: Negative for nausea, vomiting, abdominal pain, diarrhea, constipation, blood in stool, abdominal distention and anal bleeding.  Genitourinary: Negative for dysuria, hematuria, flank pain and difficulty urinating.  Musculoskeletal: Negative for myalgias, back pain, joint swelling, arthralgias and gait problem.  Skin: Negative for color change, pallor, rash and wound.  Neurological: Negative for dizziness, tremors, weakness and light-headedness.  Hematological: Negative for adenopathy. Does not bruise/bleed easily.  Psychiatric/Behavioral: Negative for behavioral problems, confusion, sleep disturbance, dysphoric mood, decreased concentration and agitation.    Physical Exam   BP 124/82  Pulse 91  Temp(Src) 97.4 F (36.3 C) (Oral)  Wt 142 lb (64.411 kg) Physical Exam  Constitutional: He is oriented to person, place, and time. He appears well-developed and well-nourished. No distress.  HENT:  Mouth/Throat: Oropharynx is clear and moist. No oropharyngeal exudate.  Cardiovascular: Normal rate, regular rhythm and normal heart sounds. Exam reveals no gallop and no friction rub.  No murmur heard.  Pulmonary/Chest: Effort normal and breath sounds normal. No respiratory distress. He has no wheezes.  Abdominal: Soft. Bowel sounds are normal. He exhibits no distension. There is no tenderness.  Lymphadenopathy:  He has no cervical adenopathy.  Neurological: He is alert and oriented to person,  place, and time.  Skin: Skin is warm and dry. No rash noted. No erythema.  Psychiatric: He has a normal mood and affect. His behavior is  normal.    Lab Results  Component Value Date   CD4TCELL 18* 08/19/2014   Lab Results  Component Value Date   CD4TABS 390* 08/19/2014   CD4TABS 370* 06/11/2014   Lab Results  Component Value Date   HIV1RNAQUANT <20 08/19/2014   Lab Results  Component Value Date   HEPBSAB POS* 06/11/2014   No results found for this basename: RPR    CBC Lab Results  Component Value Date   WBC 2.9* 07/12/2014   RBC 4.72 07/12/2014   HGB 13.0 07/12/2014   HCT 40.7 07/12/2014   PLT 180 07/12/2014   MCV 86.2 07/12/2014   MCH 27.5 07/12/2014   MCHC 31.9* 07/12/2014   RDW 13.4 07/12/2014   LYMPHSABS 1.9 07/12/2014   MONOABS 0.3 07/12/2014   EOSABS 0.1 07/12/2014   BASOSABS 0.0 07/12/2014   BMET Lab Results  Component Value Date   NA 139 07/12/2014   K 3.9 07/12/2014   CL 105 06/11/2014   CO2 26 07/12/2014   GLUCOSE 153* 07/12/2014   BUN 9.8 07/12/2014   CREATININE 0.9 07/12/2014   CALCIUM 10.1 07/12/2014   GFRNONAA >89 06/11/2014   GFRAA >89 06/11/2014     Assessment and Plan  hiv = will change to evotaz plus truvada. And d/c atv/r. Well controlled. Wanted to decrease pillburden  Health maintenance = gave flu vaccine  Multiple myeloma = currently just being observed. No treatment

## 2014-09-03 ENCOUNTER — Ambulatory Visit: Payer: BC Managed Care – PPO

## 2014-09-17 ENCOUNTER — Encounter: Payer: MEDICARE | Attending: Internal Medicine

## 2014-09-17 DIAGNOSIS — Z713 Dietary counseling and surveillance: Secondary | ICD-10-CM | POA: Insufficient documentation

## 2014-09-17 DIAGNOSIS — E119 Type 2 diabetes mellitus without complications: Secondary | ICD-10-CM | POA: Diagnosis present

## 2014-09-17 NOTE — Progress Notes (Signed)

## 2014-09-20 ENCOUNTER — Other Ambulatory Visit (HOSPITAL_BASED_OUTPATIENT_CLINIC_OR_DEPARTMENT_OTHER): Payer: BC Managed Care – PPO

## 2014-09-20 ENCOUNTER — Other Ambulatory Visit: Payer: Self-pay | Admitting: Medical Oncology

## 2014-09-20 ENCOUNTER — Ambulatory Visit (HOSPITAL_BASED_OUTPATIENT_CLINIC_OR_DEPARTMENT_OTHER): Payer: BC Managed Care – PPO | Admitting: Oncology

## 2014-09-20 ENCOUNTER — Telehealth: Payer: Self-pay | Admitting: Oncology

## 2014-09-20 ENCOUNTER — Encounter: Payer: Self-pay | Admitting: Oncology

## 2014-09-20 VITALS — BP 122/78 | HR 101 | Temp 97.4°F | Resp 18 | Ht 69.0 in | Wt 141.5 lb

## 2014-09-20 DIAGNOSIS — C9002 Multiple myeloma in relapse: Secondary | ICD-10-CM

## 2014-09-20 DIAGNOSIS — B20 Human immunodeficiency virus [HIV] disease: Secondary | ICD-10-CM

## 2014-09-20 LAB — CBC WITH DIFFERENTIAL/PLATELET
BASO%: 0.8 % (ref 0.0–2.0)
BASOS ABS: 0 10*3/uL (ref 0.0–0.1)
EOS ABS: 0.1 10*3/uL (ref 0.0–0.5)
EOS%: 1.7 % (ref 0.0–7.0)
HCT: 42.9 % (ref 38.4–49.9)
HEMOGLOBIN: 13.6 g/dL (ref 13.0–17.1)
LYMPH%: 57.2 % — AB (ref 14.0–49.0)
MCH: 27.4 pg (ref 27.2–33.4)
MCHC: 31.6 g/dL — ABNORMAL LOW (ref 32.0–36.0)
MCV: 86.7 fL (ref 79.3–98.0)
MONO#: 0.3 10*3/uL (ref 0.1–0.9)
MONO%: 9 % (ref 0.0–14.0)
NEUT#: 1.2 10*3/uL — ABNORMAL LOW (ref 1.5–6.5)
NEUT%: 31.3 % — AB (ref 39.0–75.0)
PLATELETS: 183 10*3/uL (ref 140–400)
RBC: 4.95 10*6/uL (ref 4.20–5.82)
RDW: 15 % — ABNORMAL HIGH (ref 11.0–14.6)
WBC: 3.8 10*3/uL — ABNORMAL LOW (ref 4.0–10.3)
lymph#: 2.2 10*3/uL (ref 0.9–3.3)

## 2014-09-20 LAB — COMPREHENSIVE METABOLIC PANEL (CC13)
ALK PHOS: 78 U/L (ref 40–150)
ALT: 27 U/L (ref 0–55)
ANION GAP: 8 meq/L (ref 3–11)
AST: 27 U/L (ref 5–34)
Albumin: 4 g/dL (ref 3.5–5.0)
BUN: 8.3 mg/dL (ref 7.0–26.0)
CO2: 27 mEq/L (ref 22–29)
CREATININE: 1 mg/dL (ref 0.7–1.3)
Calcium: 10.6 mg/dL — ABNORMAL HIGH (ref 8.4–10.4)
Chloride: 107 mEq/L (ref 98–109)
GLUCOSE: 145 mg/dL — AB (ref 70–140)
Potassium: 4.4 mEq/L (ref 3.5–5.1)
SODIUM: 142 meq/L (ref 136–145)
TOTAL PROTEIN: 6.8 g/dL (ref 6.4–8.3)
Total Bilirubin: 2.34 mg/dL — ABNORMAL HIGH (ref 0.20–1.20)

## 2014-09-20 NOTE — Progress Notes (Signed)
Hematology and Oncology Follow Up Visit  Rick Potter [id number] 01/04/1946 68 y.o. 09/20/2014 10:37 AM Potter,Rick, MDRamachandran, [name], MD   Principle Diagnosis: 68-year-old gentleman diagnosed with kappa light chain multiple myeloma in October of 2011. He presented with lytic bone lesions and 84% plasma infiltration in his bone marrow. No heavy chain was detected. He also has HIV disease under control.  Prior Therapy: He was treated in the [place] area initially with a Velcade-based regimen and had an excellent response with about 4% bone marrow residual disease. In April of 2014 he developed relapsed and was treated with Revlimid, Velcade and dexamethasone regimen. He had an excellent response with his kappa free light chain close to 60 mg/dL. He is planned to have maintenance Velcade treatment even his poor tolerance to Revlimid. He recently relocated to [place] to be close to family.   Current therapy: Observation and surveillance.  Interim History:  Rick Potter presents today for a followup visit. Since his last visit, he reports no new symptoms. He continues to be asymptomatic. He has not reported any constitutional symptoms of fevers or chills or sweats. He does not report any excessive bone pain no decline in his performance status. Has not reported any weight loss or appetite changes. Is not reporting any headaches or blurry vision or syncope. Is not reporting any chest pain or shortness of breath or cough or hemoptysis. Does not report any nausea or vomiting abdominal pain. Has not reported any hematochezia or melena. Spent for any frequency urgency or hesitancy. He continues to have excellent performance status and quality of life. Rest of his review of systems unremarkable.  Medications: I have reviewed the patient's current medications.  Current Outpatient Prescriptions  Medication Sig Dispense Refill  . atazanavir-cobicistat (EVOTAZ) 300-150 MG per tablet Take 1  tablet by mouth daily. Swallow whole. Do NOT crush, cut or chew tablet. Take with food.  30 tablet  11  . clopidogrel (PLAVIX) 75 MG tablet Take 75 mg by mouth daily.      . emtricitabine-tenofovir (TRUVADA) 200-300 MG per tablet Take 1 tablet by mouth daily.  30 tablet  11  . HYDROcodone-acetaminophen (NORCO) 10-325 MG per tablet Take 1 tablet by mouth every 6 (six) hours as needed.      . NORVIR 100 MG TABS tablet       . REYATAZ 300 MG capsule        No current facility-administered medications for this visit.     Allergies: No Known Allergies  Past Medical History, Surgical history, Social history, and Family History were reviewed and updated.   Physical Exam: Blood pressure 122/78, pulse 101, temperature 97.4 F (36.3 C), temperature source Oral, resp. rate 18, height 5' 9" (1.753 m), weight 141 lb 8 oz (64.184 kg). ECOG: 1 General appearance: alert and cooperative Head: Normocephalic, without obvious abnormality Neck: no adenopathy Lymph nodes: Cervical, supraclavicular, and axillary nodes normal. Heart:regular rate and rhythm, S1, S2 normal, no murmur, click, rub or gallop Lung:chest clear, no wheezing, rales, normal symmetric air entry Abdomin: soft, non-tender, without masses or organomegaly EXT:no erythema, induration, or nodules   Lab Results: Lab Results  Component Value Date   WBC 3.8* 09/20/2014   HGB 13.6 09/20/2014   HCT 42.9 09/20/2014   MCV 86.7 09/20/2014   PLT 183 09/20/2014     Chemistry      Component Value Date/Time   NA 139 07/12/2014 0944   NA 141 06/11/2014 0952   K 3.9 07/12/2014 0944     K 3.9 06/11/2014 0952   CL 105 06/11/2014 0952   CO2 26 07/12/2014 0944   CO2 29 06/11/2014 0952   BUN 9.8 07/12/2014 0944   BUN 8 06/11/2014 0952   CREATININE 0.9 07/12/2014 0944   CREATININE 0.81 06/11/2014 0952      Component Value Date/Time   CALCIUM 10.1 07/12/2014 0944   CALCIUM 9.8 06/11/2014 0952   ALKPHOS 79 07/12/2014 0944   ALKPHOS 61 06/11/2014 0952   AST 28 07/12/2014  0944   AST 24 06/11/2014 0952   ALT 20 07/12/2014 0944   ALT 15 06/11/2014 0952   BILITOT 3.27* 07/12/2014 0944   BILITOT 1.6* 06/11/2014 0952      Results for Rick, Potter (MRN [id number]) as of 09/20/2014 10:07  Ref. Range 05/29/2014 13:20 07/12/2014 09:44  Kappa free light chain Latest Range: 0.33-1.94 mg/dL 89.20 (H) 107.00 (H)  Lambda Free Lght Chn Latest Range: 0.57-2.63 mg/dL 1.03 1.00  Kappa:Lambda Ratio Latest Range: 0.26-1.65  86.60 (H) 107.00 (H)   Impression and Plan:  68 year old gentleman with the following issues:   1. 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 He is status post of Velcade-based regimen and subsequently received Velcade with Revlimid and tolerated it poorly. His most recent protein studies showed his kappa free light chain around 107. Risks and benefits of starting chemotherapy now versus at progression was discussed again. He continues to prefer deferring chemotherapy total symptoms arise. I will continue to monitor him closely with protein studies and any signs of end organ damage and Institute chemotherapy at that time. I will repeat his skeletal survey as well as protein studies in December of 2015.  2. HIV disease: Under reasonable control followed by infectious disease.  3. Port-A-Cath management: it will be flushed every 2 months.   Rick,YKDXI, MD 10/16/201510:37 AM

## 2014-09-20 NOTE — Telephone Encounter (Signed)
Pt confirmed labs/ov per 10/16 POF, gave pt AVS..... KJ

## 2014-09-24 DIAGNOSIS — E119 Type 2 diabetes mellitus without complications: Secondary | ICD-10-CM | POA: Diagnosis not present

## 2014-09-24 LAB — SPEP & IFE WITH QIG
ALPHA-2-GLOBULIN: 8.3 % (ref 7.1–11.8)
Albumin ELP: 65.4 % (ref 55.8–66.1)
Alpha-1-Globulin: 4 % (ref 2.9–4.9)
Beta 2: 3.3 % (ref 3.2–6.5)
Beta Globulin: 7 % (ref 4.7–7.2)
GAMMA GLOBULIN: 12 % (ref 11.1–18.8)
IGA: 46 mg/dL — AB (ref 68–379)
IgG (Immunoglobin G), Serum: 799 mg/dL (ref 650–1600)
IgM, Serum: 14 mg/dL — ABNORMAL LOW (ref 41–251)
M-Spike, %: 0.18 g/dL
Total Protein, Serum Electrophoresis: 6.4 g/dL (ref 6.0–8.3)

## 2014-09-24 LAB — KAPPA/LAMBDA LIGHT CHAINS
Kappa free light chain: 203 mg/dL — ABNORMAL HIGH (ref 0.33–1.94)
Kappa:Lambda Ratio: 238.82 — ABNORMAL HIGH (ref 0.26–1.65)
Lambda Free Lght Chn: 0.85 mg/dL (ref 0.57–2.63)

## 2014-09-24 NOTE — Progress Notes (Signed)
Patient was seen on 09/24/14 for the third of a series of three diabetes self-management courses at the [facility]. The following learning objectives were met by the patient during this class:    State the amount of activity recommended for healthy living   Describe activities suitable for individual needs   Identify ways to regularly incorporate activity into daily life   Identify barriers to activity and ways to over come these barriers  Identify diabetes medications being personally used and their primary action for lowering glucose and possible side effects   Describe role of stress on blood glucose and develop strategies to address psychosocial issues   Identify diabetes complications and ways to prevent them  Explain how to manage diabetes during illness   Evaluate success in meeting personal goal   Establish 2-3 goals that they will plan to diligently work on until they return for the  4-month follow-up visit  Goals:   I will count my carb choices at most meals and snacks  I will be active by walking around my apartment  I will take my diabetes medications as scheduled  Your patient has identified these potential barriers to change:  Stress  Your patient has identified their diabetes self-care support plan as  [facility] Support Group On-line Resources  Plan:  Attend Core 4 in 4 months   

## 2014-11-13 ENCOUNTER — Ambulatory Visit (HOSPITAL_COMMUNITY): Admission: RE | Admit: 2014-11-13 | Payer: BC Managed Care – PPO | Source: Ambulatory Visit

## 2014-11-13 ENCOUNTER — Other Ambulatory Visit (HOSPITAL_BASED_OUTPATIENT_CLINIC_OR_DEPARTMENT_OTHER): Payer: MEDICARE

## 2014-11-13 ENCOUNTER — Ambulatory Visit (HOSPITAL_BASED_OUTPATIENT_CLINIC_OR_DEPARTMENT_OTHER): Payer: MEDICARE

## 2014-11-13 VITALS — BP 114/77 | HR 92 | Temp 97.5°F

## 2014-11-13 DIAGNOSIS — Z452 Encounter for adjustment and management of vascular access device: Secondary | ICD-10-CM

## 2014-11-13 DIAGNOSIS — C9002 Multiple myeloma in relapse: Secondary | ICD-10-CM

## 2014-11-13 DIAGNOSIS — Z95828 Presence of other vascular implants and grafts: Secondary | ICD-10-CM

## 2014-11-13 DIAGNOSIS — C9000 Multiple myeloma not having achieved remission: Secondary | ICD-10-CM

## 2014-11-13 LAB — CBC WITH DIFFERENTIAL/PLATELET
BASO%: 0.9 % (ref 0.0–2.0)
Basophils Absolute: 0 10*3/uL (ref 0.0–0.1)
EOS ABS: 0.1 10*3/uL (ref 0.0–0.5)
EOS%: 2.1 % (ref 0.0–7.0)
HCT: 38.6 % (ref 38.4–49.9)
HEMOGLOBIN: 12.5 g/dL — AB (ref 13.0–17.1)
LYMPH#: 1.9 10*3/uL (ref 0.9–3.3)
LYMPH%: 60.6 % — ABNORMAL HIGH (ref 14.0–49.0)
MCH: 28.4 pg (ref 27.2–33.4)
MCHC: 32.4 g/dL (ref 32.0–36.0)
MCV: 87.7 fL (ref 79.3–98.0)
MONO#: 0.4 10*3/uL (ref 0.1–0.9)
MONO%: 11 % (ref 0.0–14.0)
NEUT#: 0.8 10*3/uL — ABNORMAL LOW (ref 1.5–6.5)
NEUT%: 25.4 % — ABNORMAL LOW (ref 39.0–75.0)
Platelets: 178 10*3/uL (ref 140–400)
RBC: 4.4 10*6/uL (ref 4.20–5.82)
RDW: 14.1 % (ref 11.0–14.6)
WBC: 3.2 10*3/uL — ABNORMAL LOW (ref 4.0–10.3)

## 2014-11-13 LAB — COMPREHENSIVE METABOLIC PANEL (CC13)
ALBUMIN: 4 g/dL (ref 3.5–5.0)
ALT: 21 U/L (ref 0–55)
AST: 26 U/L (ref 5–34)
Alkaline Phosphatase: 71 U/L (ref 40–150)
Anion Gap: 9 mEq/L (ref 3–11)
BUN: 8.7 mg/dL (ref 7.0–26.0)
CHLORIDE: 106 meq/L (ref 98–109)
CO2: 26 mEq/L (ref 22–29)
CREATININE: 0.9 mg/dL (ref 0.7–1.3)
Calcium: 9.7 mg/dL (ref 8.4–10.4)
GLUCOSE: 112 mg/dL (ref 70–140)
POTASSIUM: 4 meq/L (ref 3.5–5.1)
Sodium: 140 mEq/L (ref 136–145)
Total Bilirubin: 2.8 mg/dL — ABNORMAL HIGH (ref 0.20–1.20)
Total Protein: 6.2 g/dL — ABNORMAL LOW (ref 6.4–8.3)

## 2014-11-13 MED ORDER — SODIUM CHLORIDE 0.9 % IJ SOLN
10.0000 mL | INTRAMUSCULAR | Status: DC | PRN
  Administered 2014-11-13: 10 mL via INTRAVENOUS
  Filled 2014-11-13: qty 10

## 2014-11-13 MED ORDER — HEPARIN SOD (PORK) LOCK FLUSH 100 UNIT/ML IV SOLN
500.0000 [IU] | Freq: Once | INTRAVENOUS | Status: AC
  Administered 2014-11-13: 500 [IU] via INTRAVENOUS
  Filled 2014-11-13: qty 5

## 2014-11-13 NOTE — Patient Instructions (Signed)

## 2014-11-15 LAB — SPEP & IFE WITH QIG
ALPHA-2-GLOBULIN: 7.8 % (ref 7.1–11.8)
Albumin ELP: 65.4 % (ref 55.8–66.1)
Alpha-1-Globulin: 4.1 % (ref 2.9–4.9)
BETA GLOBULIN: 7.4 % — AB (ref 4.7–7.2)
Beta 2: 4 % (ref 3.2–6.5)
GAMMA GLOBULIN: 11.3 % (ref 11.1–18.8)
IgA: 23 mg/dL — ABNORMAL LOW (ref 68–379)
IgG (Immunoglobin G), Serum: 825 mg/dL (ref 650–1600)
IgM, Serum: 7 mg/dL — ABNORMAL LOW (ref 41–251)
M-SPIKE, %: 0.15 g/dL
Total Protein, Serum Electrophoresis: 6.4 g/dL (ref 6.0–8.3)

## 2014-11-15 LAB — KAPPA/LAMBDA LIGHT CHAINS
Kappa free light chain: 386 mg/dL — ABNORMAL HIGH (ref 0.33–1.94)
Kappa:Lambda Ratio: 593.85 — ABNORMAL HIGH (ref 0.26–1.65)
Lambda Free Lght Chn: 0.65 mg/dL (ref 0.57–2.63)

## 2014-11-20 ENCOUNTER — Ambulatory Visit (HOSPITAL_BASED_OUTPATIENT_CLINIC_OR_DEPARTMENT_OTHER): Payer: MEDICARE | Admitting: Oncology

## 2014-11-20 ENCOUNTER — Ambulatory Visit (HOSPITAL_COMMUNITY)
Admission: RE | Admit: 2014-11-20 | Discharge: 2014-11-20 | Disposition: A | Discharge status: Home / Self Care | Payer: MEDICARE | Source: Ambulatory Visit | Attending: Oncology | Admitting: Oncology

## 2014-11-20 ENCOUNTER — Telehealth: Payer: Self-pay | Admitting: Oncology

## 2014-11-20 VITALS — BP 107/67 | HR 92 | Temp 97.6°F | Resp 18 | Ht 69.0 in | Wt 134.1 lb

## 2014-11-20 DIAGNOSIS — C9000 Multiple myeloma not having achieved remission: Secondary | ICD-10-CM

## 2014-11-20 DIAGNOSIS — C9002 Multiple myeloma in relapse: Secondary | ICD-10-CM

## 2014-11-20 DIAGNOSIS — R0609 Other forms of dyspnea: Secondary | ICD-10-CM

## 2014-11-20 DIAGNOSIS — B20 Human immunodeficiency virus [HIV] disease: Secondary | ICD-10-CM

## 2014-11-20 NOTE — Telephone Encounter (Signed)
Gave avs & cal for Feb. °

## 2014-11-20 NOTE — Progress Notes (Signed)
Hematology and Oncology Follow Up Visit  Rick Potter [id number] [birth year] 68 y.o. 11/20/2014 10:53 AM Rick Potter, MDRamachandran, Rick Kaufmann, MD   Principle Diagnosis: 68 year old gentleman diagnosed with kappa light chain multiple myeloma in October of 2011. He presented with lytic bone lesions and 84% plasma infiltration in his bone marrow. No heavy chain was detected. He also has HIV disease under control.  Prior Therapy: He was treated in the [facility] area initially with a Velcade-based regimen and had an excellent response with about 4% bone marrow residual disease. In April of 2014 he developed relapsed and was treated with Revlimid, Velcade and dexamethasone regimen. He had an excellent response with his kappa free light chain close to 60 mg/dL. He is planned to have maintenance Velcade treatment even his poor tolerance to Revlimid. He recently relocated to [place] to be close to family.   Current therapy: Observation and surveillance.  Interim History:  Rick Potter presents today for a followup visit. Since his last visit, he has reported some new symptoms. He is reporting some occasional exertional dyspnea as well as occasional fatigue. He also reporting some dyspepsia and postprandial pain. He is not reporting any bone pain and continues to have reasonable performance status and activity level. He has not reported any constitutional symptoms of fevers or chills or sweats.  He is reporting a decline in his appetite and weight loss. He Is not reporting any headaches or blurry vision or syncope. Is not reporting any chest pain or shortness of breath or cough or hemoptysis. Does not report any nausea or vomiting abdominal pain. Has not reported any hematochezia or melena. Spent for any frequency urgency or hesitancy.  Rest of his review of systems unremarkable.  Medications: I have reviewed the patient's current medications.  Current Outpatient Prescriptions  Medication Sig  Dispense Refill  . amoxicillin (AMOXIL) 500 MG capsule   0  . atazanavir-cobicistat (EVOTAZ) 300-150 MG per tablet Take 1 tablet by mouth daily. Swallow whole. Do NOT crush, cut or chew tablet. Take with food. 30 tablet 11  . chlorhexidine (PERIDEX) 0.12 % solution   0  . clopidogrel (PLAVIX) 75 MG tablet Take 75 mg by mouth daily.    . [facility] BISACODYL 5 MG EC tablet See admin instructions.  0  . emtricitabine-tenofovir (TRUVADA) 200-300 MG per tablet Take 1 tablet by mouth daily. 30 tablet 11  . HYDROcodone-acetaminophen (NORCO) 10-325 MG per tablet Take 1 tablet by mouth every 6 (six) hours as needed.    . NORVIR 100 MG TABS tablet     . polyethylene glycol-electrolytes (NULYTELY/GOLYTELY) 420 G solution See admin instructions.  0  . REYATAZ 300 MG capsule      No current facility-administered medications for this visit.     Allergies: No Known Allergies  Past Medical History, Surgical history, Social history, and Family History were reviewed and updated.   Physical Exam: Blood pressure 107/67, pulse 92, temperature 97.6 F (36.4 C), temperature source Oral, resp. rate 18, height 5' 9"  (1.753 m), weight 134 lb 1.6 oz (60.827 kg). ECOG: 1 General appearance: alert and cooperative Head: Normocephalic, without obvious abnormality Neck: no adenopathy Lymph nodes: Cervical, supraclavicular, and axillary nodes normal. Heart:regular rate and rhythm, S1, S2 normal, no murmur, click, rub or gallop  Chest wall examination: Could not reproduce chest wall pain. Lung:chest clear, no wheezing, rales, normal symmetric air entry Abdomin: soft, non-tender, without masses or organomegaly EXT:no erythema, induration, or nodules   Lab Results: Lab Results  Component Value Date  WBC 3.2* 11/13/2014   HGB 12.5* 11/13/2014   HCT 38.6 11/13/2014   MCV 87.7 11/13/2014   PLT 178 11/13/2014     Chemistry      Component Value Date/Time   NA 140 11/13/2014 1020   NA 141 06/11/2014 0952   K 4.0  11/13/2014 1020   K 3.9 06/11/2014 0952   CL 105 06/11/2014 0952   CO2 26 11/13/2014 1020   CO2 29 06/11/2014 0952   BUN 8.7 11/13/2014 1020   BUN 8 06/11/2014 0952   CREATININE 0.9 11/13/2014 1020   CREATININE 0.81 06/11/2014 0952      Component Value Date/Time   CALCIUM 9.7 11/13/2014 1020   CALCIUM 9.8 06/11/2014 0952   ALKPHOS 71 11/13/2014 1020   ALKPHOS 61 06/11/2014 0952   AST 26 11/13/2014 1020   AST 24 06/11/2014 0952   ALT 21 11/13/2014 1020   ALT 15 06/11/2014 0952   BILITOT 2.80* 11/13/2014 1020   BILITOT 1.6* 06/11/2014 0952     Results for Rick Potter, Rick Potter (MRN [id number]) as of 11/20/2014 10:33  Ref. Range 09/20/2014 10:04 11/13/2014 10:20  Kappa free light chain Latest Range: 0.33-1.94 mg/dL 203.00 (H) 386.00 (H)  Lambda Free Lght Chn Latest Range: 0.57-2.63 mg/dL 0.85 0.65  Kappa:Lambda Ratio Latest Range: 0.26-1.65  238.82 (H) 593.85 (H)     Impression and Plan:  68 year old gentleman with the following issues:   1. 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 He is status post of Velcade-based regimen and subsequently received Velcade with Revlimid and tolerated it poorly.  His light chains have been increasing slowly in the last few months. He was not able to get skeletal survey and this will be scheduled. Risks and benefits of starting chemotherapy now versus at symptomatic disease. He continues to prefer deferring chemotherapy total symptoms arise. I will continue to monitor him closely with protein studies and any signs of end organ damage and [facility] chemotherapy at that time. He continues to have normal hemoglobin and normal kidney function without any abnormalities in his calcium or protein levels.  2. HIV disease: Under reasonable control followed by infectious disease.  3. Port-A-Cath management: it will be flushed every 2 months.  4. Post prandial pain and exertional dyspnea: Unclear etiology at this time I recommended that  he be evaluated by his very care physician for a possible cardiology and and/or gastroenterology referral.  Zola Button, MD 12/16/201510:53 AM

## 2014-12-09 ENCOUNTER — Encounter: Payer: Self-pay | Admitting: Internal Medicine

## 2014-12-09 ENCOUNTER — Ambulatory Visit (INDEPENDENT_AMBULATORY_CARE_PROVIDER_SITE_OTHER): Payer: BLUE CROSS/BLUE SHIELD | Admitting: Internal Medicine

## 2014-12-09 VITALS — BP 116/74 | HR 101 | Temp 97.7°F | Wt 135.0 lb

## 2014-12-09 DIAGNOSIS — R0609 Other forms of dyspnea: Secondary | ICD-10-CM

## 2014-12-09 DIAGNOSIS — Z21 Asymptomatic human immunodeficiency virus [HIV] infection status: Secondary | ICD-10-CM

## 2014-12-09 LAB — CBC WITH DIFFERENTIAL/PLATELET
Basophils Absolute: 0 10*3/uL (ref 0.0–0.1)
Basophils Relative: 0 % (ref 0–1)
Eosinophils Absolute: 0.1 10*3/uL (ref 0.0–0.7)
Eosinophils Relative: 2 % (ref 0–5)
HCT: 39.9 % (ref 39.0–52.0)
Hemoglobin: 13.3 g/dL (ref 13.0–17.0)
LYMPHS PCT: 45 % (ref 12–46)
Lymphs Abs: 1.8 10*3/uL (ref 0.7–4.0)
MCH: 28.9 pg (ref 26.0–34.0)
MCHC: 33.3 g/dL (ref 30.0–36.0)
MCV: 86.7 fL (ref 78.0–100.0)
MONOS PCT: 9 % (ref 3–12)
MPV: 8.6 fL (ref 8.6–12.4)
Monocytes Absolute: 0.4 10*3/uL (ref 0.1–1.0)
Neutro Abs: 1.7 10*3/uL (ref 1.7–7.7)
Neutrophils Relative %: 44 % (ref 43–77)
PLATELETS: 178 10*3/uL (ref 150–400)
RBC: 4.6 MIL/uL (ref 4.22–5.81)
RDW: 14.1 % (ref 11.5–15.5)
WBC: 3.9 10*3/uL — ABNORMAL LOW (ref 4.0–10.5)

## 2014-12-09 LAB — COMPLETE METABOLIC PANEL WITH GFR
ALT: 23 U/L (ref 0–53)
AST: 30 U/L (ref 0–37)
Albumin: 4.3 g/dL (ref 3.5–5.2)
Alkaline Phosphatase: 59 U/L (ref 39–117)
BUN: 8 mg/dL (ref 6–23)
CALCIUM: 10 mg/dL (ref 8.4–10.5)
CHLORIDE: 100 meq/L (ref 96–112)
CO2: 29 meq/L (ref 19–32)
Creat: 0.91 mg/dL (ref 0.50–1.35)
GFR, Est Non African American: 88 mL/min
Glucose, Bld: 127 mg/dL — ABNORMAL HIGH (ref 70–99)
POTASSIUM: 3.7 meq/L (ref 3.5–5.3)
Sodium: 137 mEq/L (ref 135–145)
TOTAL PROTEIN: 6.3 g/dL (ref 6.0–8.3)
Total Bilirubin: 3.9 mg/dL — ABNORMAL HIGH (ref 0.2–1.2)

## 2014-12-09 MED ORDER — DARUNAVIR-COBICISTAT 800-150 MG PO TABS: 1.0000 | ORAL_TABLET | Freq: Every day | ORAL | Status: DC

## 2014-12-09 NOTE — Progress Notes (Signed)
Patient ID: Rick Potter, male   DOB: 1946-11-30, 69 y.o.   MRN: [id number]  HPI 69 year old gentleman with well controlled HIV disease, CD 4 count 390/VL<20, on truvada/evotaz (just initiated at his last visit). He also has kappa light chain multiple myeloma in October of 2011. He presented with lytic bone lesions and 84% plasma subtle filtration in his bone marrow. No heavy chain was detected. S/p velcade treatment but did not tolerate revlmid. He saws oncology who will be following him to see when to re-initiate chemotherapy He states that he was started on a pill a day to help with his reflux/chest pain he has after breakfast  Outpatient Encounter Prescriptions as of 12/09/2014  Medication Sig  . amoxicillin (AMOXIL) 500 MG capsule   . atazanavir-cobicistat (EVOTAZ) 300-150 MG per tablet Take 1 tablet by mouth daily. Swallow whole. Do NOT crush, cut or chew tablet. Take with food.  . clopidogrel (PLAVIX) 75 MG tablet Take 75 mg by mouth daily.  Marland Kitchen emtricitabine-tenofovir (TRUVADA) 200-300 MG per tablet Take 1 tablet by mouth daily.  . chlorhexidine (PERIDEX) 0.12 % solution   . [facility] BISACODYL 5 MG EC tablet See admin instructions.  Marland Kitchen HYDROcodone-acetaminophen (NORCO) 10-325 MG per tablet Take 1 tablet by mouth every 6 (six) hours as needed.  . polyethylene glycol-electrolytes (NULYTELY/GOLYTELY) 420 G solution See admin instructions.  . [DISCONTINUED] NORVIR 100 MG TABS tablet      Patient Active Problem List   Diagnosis Date Noted  . HIV disease 07/01/2014  . Multiple myeloma in relapse 07/01/2014  . TIA (transient ischemic attack) 07/01/2014  . DDD (degenerative disc disease) 07/01/2014  . OA (osteoarthritis) 07/01/2014  . Pacemaker 07/01/2014  . History of colonoscopy 07/01/2014  . History of laminectomy 07/01/2014     Health Maintenance Due  Topic Date Due  . HEMOGLOBIN A1C  1946/04/16  . FOOT EXAM  04/09/1958  . OPHTHALMOLOGY EXAM  04/09/1958  . URINE MICROALBUMIN   04/09/1958  . TETANUS/TDAP  04/10/1967  . COLONOSCOPY  04/09/1998  . ZOSTAVAX  04/09/2008     Review of Systems Per hpi Physical Exam   BP 116/74 mmHg  Pulse 101  Temp(Src) 97.7 F (36.5 C) (Oral)  Wt 135 lb (61.236 kg) Physical Exam  Constitutional: He is oriented to person, place, and time. He appears well-developed and well-nourished. No distress.  HENT:  Mouth/Throat: Oropharynx is clear and moist. No oropharyngeal exudate.  Cardiovascular: Normal rate, regular rhythm and normal heart sounds. Exam reveals no gallop and no friction rub.  No murmur heard.  Pulmonary/Chest: Effort normal and breath sounds normal. No respiratory distress. He has no wheezes.  Abdominal: Soft. Bowel sounds are normal. He exhibits no distension. There is no tenderness.  Lymphadenopathy:  He has no cervical adenopathy.  Neurological: He is alert and oriented to person, place, and time.  Skin: Skin is warm and dry. No rash noted. No erythema.  Psychiatric: tearful during exam and history   Lab Results  Component Value Date   CD4TCELL 18* 08/19/2014   Lab Results  Component Value Date   CD4TABS 390* 08/19/2014   CD4TABS 370* 06/11/2014   Lab Results  Component Value Date   HIV1RNAQUANT <20 08/19/2014   Lab Results  Component Value Date   HEPBSAB POS* 06/11/2014   No results found for: RPR  CBC Lab Results  Component Value Date   WBC 3.2* 11/13/2014   RBC 4.40 11/13/2014   HGB 12.5* 11/13/2014   HCT 38.6 11/13/2014  PLT 178 11/13/2014   MCV 87.7 11/13/2014   MCH 28.4 11/13/2014   MCHC 32.4 11/13/2014   RDW 14.1 11/13/2014   LYMPHSABS 1.9 11/13/2014   MONOABS 0.4 11/13/2014   EOSABS 0.1 11/13/2014   BASOSABS 0.0 11/13/2014   BMET Lab Results  Component Value Date   NA 140 11/13/2014   K 4.0 11/13/2014   CL 105 06/11/2014   CO2 26 11/13/2014   GLUCOSE 112 11/13/2014   BUN 8.7 11/13/2014   CREATININE 0.9 11/13/2014   CALCIUM 9.7 11/13/2014   GFRNONAA >89 06/11/2014     GFRAA >89 06/11/2014     Assessment and Plan  hiv = will change him from evo/taz to prezcobix, since he is starting ppi, since there is less interaction with prezcobix. Get old records to see if can find archive geno. Will get labs today  Weight loss = will have him do nutritional supplements and recheck in 1 month  Doe = will get echo and see if need to refer to pulm vs. Cardiology  MM= currently followed closely by Dr. Alen Blew  Decreased mood/depression = difficult to tell if situational. Will continue to monitor for symptoms   rtc in  1 month

## 2014-12-10 LAB — HIV-1 RNA QUANT-NO REFLEX-BLD: HIV-1 RNA Quant, Log: <1.3 {Log} (ref <1.30)

## 2014-12-10 LAB — T-HELPER CELL (CD4) - (RCID CLINIC ONLY)
CD4 % Helper T Cell: 19 % — ABNORMAL LOW (ref 33–55)
CD4 T Cell Abs: 340 /uL — ABNORMAL LOW (ref 400–2700)

## 2014-12-19 ENCOUNTER — Telehealth: Payer: Self-pay

## 2014-12-19 ENCOUNTER — Other Ambulatory Visit: Payer: Self-pay

## 2014-12-19 ENCOUNTER — Emergency Department (HOSPITAL_COMMUNITY): Payer: Medicare Other

## 2014-12-19 ENCOUNTER — Ambulatory Visit (HOSPITAL_COMMUNITY): Payer: BLUE CROSS/BLUE SHIELD | Attending: Internal Medicine | Admitting: Radiology

## 2014-12-19 ENCOUNTER — Emergency Department (HOSPITAL_COMMUNITY)
Admission: EM | Admit: 2014-12-19 | Discharge: 2014-12-19 | Disposition: A | Discharge status: Home / Self Care | Payer: Medicare Other | Attending: Emergency Medicine | Admitting: Emergency Medicine

## 2014-12-19 ENCOUNTER — Encounter (HOSPITAL_COMMUNITY): Payer: Self-pay | Admitting: Emergency Medicine

## 2014-12-19 DIAGNOSIS — Z79899 Other long term (current) drug therapy: Secondary | ICD-10-CM | POA: Diagnosis not present

## 2014-12-19 DIAGNOSIS — R0600 Dyspnea, unspecified: Secondary | ICD-10-CM | POA: Insufficient documentation

## 2014-12-19 DIAGNOSIS — Z792 Long term (current) use of antibiotics: Secondary | ICD-10-CM | POA: Insufficient documentation

## 2014-12-19 DIAGNOSIS — Z8579 Personal history of other malignant neoplasms of lymphoid, hematopoietic and related tissues: Secondary | ICD-10-CM | POA: Insufficient documentation

## 2014-12-19 DIAGNOSIS — R0789 Other chest pain: Secondary | ICD-10-CM

## 2014-12-19 DIAGNOSIS — Z87891 Personal history of nicotine dependence: Secondary | ICD-10-CM | POA: Insufficient documentation

## 2014-12-19 DIAGNOSIS — Z21 Asymptomatic human immunodeficiency virus [HIV] infection status: Secondary | ICD-10-CM | POA: Diagnosis not present

## 2014-12-19 DIAGNOSIS — R079 Chest pain, unspecified: Secondary | ICD-10-CM

## 2014-12-19 DIAGNOSIS — Z7902 Long term (current) use of antithrombotics/antiplatelets: Secondary | ICD-10-CM | POA: Insufficient documentation

## 2014-12-19 DIAGNOSIS — R0609 Other forms of dyspnea: Secondary | ICD-10-CM

## 2014-12-19 DIAGNOSIS — E119 Type 2 diabetes mellitus without complications: Secondary | ICD-10-CM | POA: Insufficient documentation

## 2014-12-19 LAB — URINALYSIS, ROUTINE W REFLEX MICROSCOPIC
BILIRUBIN URINE: NEGATIVE
Glucose, UA: NEGATIVE mg/dL
Ketones, ur: NEGATIVE mg/dL
LEUKOCYTES UA: NEGATIVE
Nitrite: NEGATIVE
Protein, ur: 30 mg/dL — AB
SPECIFIC GRAVITY, URINE: 1.01 (ref 1.005–1.030)
Urobilinogen, UA: 1 mg/dL (ref 0.0–1.0)
pH: 6 (ref 5.0–8.0)

## 2014-12-19 LAB — CBC
HCT: 35.9 % — ABNORMAL LOW (ref 39.0–52.0)
Hemoglobin: 12.1 g/dL — ABNORMAL LOW (ref 13.0–17.0)
MCH: 29.4 pg (ref 26.0–34.0)
MCHC: 33.7 g/dL (ref 30.0–36.0)
MCV: 87.1 fL (ref 78.0–100.0)
Platelets: 155 10*3/uL (ref 150–400)
RBC: 4.12 MIL/uL — ABNORMAL LOW (ref 4.22–5.81)
RDW: 12.4 % (ref 11.5–15.5)
WBC: 3.3 10*3/uL — AB (ref 4.0–10.5)

## 2014-12-19 LAB — DIFFERENTIAL
BASOS ABS: 0 10*3/uL (ref 0.0–0.1)
BASOS PCT: 0 % (ref 0–1)
EOS PCT: 2 % (ref 0–5)
Eosinophils Absolute: 0.1 10*3/uL (ref 0.0–0.7)
Lymphocytes Relative: 69 % — ABNORMAL HIGH (ref 12–46)
Lymphs Abs: 2.2 10*3/uL (ref 0.7–4.0)
Monocytes Absolute: 0.3 10*3/uL (ref 0.1–1.0)
Monocytes Relative: 9 % (ref 3–12)
Neutro Abs: 0.7 10*3/uL — ABNORMAL LOW (ref 1.7–7.7)
Neutrophils Relative %: 20 % — ABNORMAL LOW (ref 43–77)

## 2014-12-19 LAB — URINE MICROSCOPIC-ADD ON

## 2014-12-19 LAB — I-STAT TROPONIN, ED: TROPONIN I, POC: 0.04 ng/mL (ref 0.00–0.08)

## 2014-12-19 LAB — BASIC METABOLIC PANEL
ANION GAP: 6 (ref 5–15)
BUN: <5 mg/dL — ABNORMAL LOW (ref 6–23)
CO2: 30 mmol/L (ref 19–32)
Calcium: 9.8 mg/dL (ref 8.4–10.5)
Chloride: 104 mEq/L (ref 96–112)
Creatinine, Ser: 0.87 mg/dL (ref 0.50–1.35)
GFR calc Af Amer: >90 mL/min (ref >90)
GFR calc non Af Amer: 88 mL/min — ABNORMAL LOW (ref >90)
GLUCOSE: 111 mg/dL — AB (ref 70–99)
Potassium: 4.4 mmol/L (ref 3.5–5.1)
Sodium: 140 mmol/L (ref 135–145)

## 2014-12-19 LAB — BRAIN NATRIURETIC PEPTIDE: B NATRIURETIC PEPTIDE 5: 486.7 pg/mL — AB (ref 0.0–100.0)

## 2014-12-19 MED ORDER — IOHEXOL 350 MG/ML SOLN
100.0000 mL | Freq: Once | INTRAVENOUS | Status: AC | PRN
  Administered 2014-12-19: 100 mL via INTRAVENOUS

## 2014-12-19 MED ORDER — MORPHINE SULFATE 4 MG/ML IJ SOLN
4.0000 mg | Freq: Once | INTRAMUSCULAR | Status: AC
  Administered 2014-12-19: 4 mg via INTRAVENOUS
  Filled 2014-12-19: qty 1

## 2014-12-19 MED ORDER — ASPIRIN 81 MG PO CHEW: 324.0000 mg | CHEWABLE_TABLET | Freq: Once | ORAL | Status: DC

## 2014-12-19 MED ORDER — OXYCODONE-ACETAMINOPHEN 5-325 MG PO TABS: 1.0000 | ORAL_TABLET | ORAL | Status: DC | PRN

## 2014-12-19 NOTE — ED Provider Notes (Signed)
The patient is a 69 year old male, he has a history of HIV, he has a history of borderline diabetes, has a history of incomplete treatment for multiple myeloma which she underwent in [place] prior to moving here within the last 8 months. He states that he has had chest pain today while getting an echocardiogram but has had dyspnea on exertion and has a hard time walking across the room without significant dyspnea which has been gradually worsening over the last year. On exam the patient appears comfortable, he is thin, he has no peripheral edema or asymmetry, his lungs and heart appear clear, there is no distress, no wheezing, no rales, no tachycardia, no murmurs. EKG shows a right bundle branch block, labs pending to evaluate for cardiac, pulmonary, CHF abnormalities. The patient is in agreement with the plan. This could be related to both of cancer especially given the weight loss   ED ECG REPORT  I personally interpreted this EKG   Date: 12/21/2014   Rate: 85  Rhythm: normal sinus rhythm  QRS Axis: left  Intervals: normal  ST/T Wave abnormalities: nonspecific T wave changes  Conduction Disutrbances:right bundle branch block and left anterior fascicular block  Narrative Interpretation:   Old EKG Reviewed: none available    Medical screening examination/treatment/procedure(s) were conducted as a shared visit with non-physician practitioner(s) and myself.  I personally evaluated the patient during the encounter.  Clinical Impression:   Final diagnoses:  Chest pain  Dyspnea         Johnna Acosta, MD 12/21/14 1233

## 2014-12-19 NOTE — ED Notes (Signed)
Patient transported to X-ray 

## 2014-12-19 NOTE — Discharge Instructions (Signed)
Shortness of Breath Shortness of breath means you have trouble breathing. It could also mean that you have a medical problem. You should get immediate medical care for shortness of breath. CAUSES   Not enough oxygen in the air such as with high altitudes or a smoke-filled room.  Certain lung diseases, infections, or problems.  Heart disease or conditions, such as angina or heart failure.  Low red blood cells (anemia).  Poor physical fitness, which can cause shortness of breath when you exercise.  Chest or back injuries or stiffness.  Being overweight.  Smoking.  Anxiety, which can make you feel like you are not getting enough air. DIAGNOSIS  Serious medical problems can often be found during your physical exam. Tests may also be done to determine why you are having shortness of breath. Tests may include:  Chest X-rays.  Lung function tests.  Blood tests.  An electrocardiogram (ECG).  An ambulatory electrocardiogram. An ambulatory ECG records your heartbeat patterns over a 24-hour period.  Exercise testing.  A transthoracic echocardiogram (TTE). During echocardiography, sound waves are used to evaluate how blood flows through your heart.  A transesophageal echocardiogram (TEE).  Imaging scans. Your health care provider may not be able to find a cause for your shortness of breath after your exam. In this case, it is important to have a follow-up exam with your health care provider as directed.  TREATMENT  Treatment for shortness of breath depends on the cause of your symptoms and can vary greatly. HOME CARE INSTRUCTIONS   Do not smoke. Smoking is a common cause of shortness of breath. If you smoke, ask for help to quit.  Avoid being around chemicals or things that may bother your breathing, such as paint fumes and dust.  Rest as needed. Slowly resume your usual activities.  If medicines were prescribed, take them as directed for the full length of time directed. This  includes oxygen and any inhaled medicines.  Keep all follow-up appointments as directed by your health care provider. SEEK MEDICAL CARE IF:   Your condition does not improve in the time expected.  You have a hard time doing your normal activities even with rest.  You have any new symptoms. SEEK IMMEDIATE MEDICAL CARE IF:   Your shortness of breath gets worse.  You feel light-headed, faint, or develop a cough not controlled with medicines.  You start coughing up blood.  You have pain with breathing.  You have chest pain or pain in your arms, shoulders, or abdomen.  You have a fever.  You are unable to walk up stairs or exercise the way you normally do. MAKE SURE YOU:  Understand these instructions.  Will watch your condition.  Will get help right away if you are not doing well or get worse. Document Released: 08/17/2001 Document Revised: 11/27/2013 Document Reviewed: 02/07/2012 [facility] Patient Information 2015 [place], [place]. This information is not intended to replace advice given to you by your health care provider. Make sure you discuss any questions you have with your health care provider. Multiple Myeloma Multiple myeloma is the most common cancer of bone. It is caused by the uncontrolled multiplication of a type of white blood cell in the marrow. This white blood cell is called a plasma cell. This means the bone marrow is overworking producing plasma cells. Soon these overproduced cells begin to take up room in the marrow that is needed by other cells. This means that there are soon not enough red or white blood cells  or platelets. Not enough red cells mean that the person is anemic. There are not enough red blood cells to carry oxygen around the body. There are not enough white blood cells to fight disease. This causes the person with multiple myeloma to not feel well. There is also bone pain through much of the body. SYMPTOMS  Anemia causes fatigue (tiredness) and  weakness.  Back pain is common. This is from fractures (break in bones) caused by damage to the bones of the back.  Lack of white blood cells makes infection more likely.  Bleeding is a common problem from lack of the cells (platelets). Platelets help blood clots form. This may show up as bleeding from any place. Commonly this shows up as bleeding from the nose or gums.  Fractures (bone breaks) are more common anywhere. The back and ribs are the most commonly fractured areas. DIAGNOSIS  This tumor is often suggested by blood tests. Often doing a bone marrow sample makes the diagnosis (learning what is wrong). This is a test performed by taking a small sample of bone with a small needle. This bone often comes from the sternum (breast bone). This sample is sent to a pathologist (a specialist in looking at tissue under a microscope). After looking at the sample under the microscope, the pathologist is able to make a diagnosis of the problem. X-rays may also show boney changes. TREATMENT   Occasionally, anti-cancer medications may be used with multiple myeloma. Your caregiver can discuss this with you.  Medications can also be given to help with the bone pain.  There is no cure for multiple myeloma. Lifestyle changes can add years of quality living. HOME CARE INSTRUCTIONS  Often there is no specific treatment for multiple myeloma. Most of the treatment consists of adjustments in dietary and living activities. Some of these changes include:  Your dietitian or caregiver helping you with your dietary questions.  Taking iron and vitamins as prescribed by your caregiver.  Eating a well balanced diet.  Staying active, but follow restrictions suggested by your caregiver. Avoiding heavy lifting (more than 10 pounds) and activities that cause increased pain.  Drinking plenty of water.  Using back braces and a cane may help with some of the boney pain. SEEK IMMEDIATE MEDICAL CARE IF:  You develop  severe, uncontrolled boney pain.  You or your family notices confusion, problems with decision-making or inability to stay awake.  You notice increased urination or constipation.  You notice problems holding your water or stool.  You have numbness or loss of control of your extremities (arms/hands or legs/feet). Document Released: 08/17/2001 Document Revised: 02/14/2012 Document Reviewed: 11/17/2008 [facility] Patient Information 2015 [place], [place]. This information is not intended to replace advice given to you by your health care provider. Make sure you discuss any questions you have with your health care provider.

## 2014-12-19 NOTE — ED Notes (Signed)
Patient returned from CT

## 2014-12-19 NOTE — Progress Notes (Signed)
Echocardiogram not performed. Patient did not tolerate procedure. Patient was unable to lay flat due to chest pain.  Triage was called to evaluate patient.  Patient was discharged to the ER via ambulance.

## 2014-12-19 NOTE — ED Notes (Signed)
Pt knows that urine is needed 

## 2014-12-19 NOTE — ED Provider Notes (Signed)
CSN: [id number]     Arrival date & time 12/19/14  1543 History   First MD Initiated Contact with Patient 12/19/14 1547     Chief Complaint  Patient presents with  . Chest Pain     (Consider location/radiation/quality/duration/timing/severity/associated sxs/prior Treatment) HPI   Rick Potter Is a very pleasant 69 year old male who was sent to the emergency department for evaluation of chest pain during 2-D echocardiogram procedure today. He has a past medical history of HIV, multiple myeloma, diabetes. 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 He has a port in his right upper chest. Patient states that he has had approximately 7 months of severe shortness of breath. He states that he can only walk a few feet before he is completely winded. The patient moved from [facility] about 7 months ago and states that he complained to his doctors there, however, nothing was done about it at that time. Over the past month. The patient has noticed and unintentional 9 pound weight loss. He states that his shortness of breath is severely limiting his activities of daily living. He endorses complete loss of appetite and has been working very hard to make himself eat. His last viral load on 12/09/2014 was undetectable. His CD4 count was low at about 324. The patient states that he has not been on any chemotherapeutic agents for the past 7 months since his move to [place]. At the time that he left [place]. He was in remission from his multiple myeloma. He denies fevers or soaking night sweats. He does endorse significant bone pain, which seems constant. He denies any unintentional or pathologic fractures. Patient denies chest pain with exertion. He he was scheduled by his infectious disease doctor for a 2-D echocardiogram to evaluate the heart. He states that he was fine until he turned on his left side and began having extreme discomfort and pain in the left chest. He sat up and his pain  resolved. He went to continue with the procedure. When he laid back down on his left side. The pain began again. The procedure was discontinued and he was sent to the emergency department. Patient denies any abdominal symptoms, urinary symptoms. He is not on any medications for diabetes as he has had a significant amount of weight loss over time and no longer requires medication.  Past Medical History  Diagnosis Date  . Multiple myeloma 2011  . HIV infection   . Hyperlipidemia   . Diabetes mellitus without complication    Past Surgical History  Procedure Laterality Date  . Neck surgery    . Psychologist, forensic    . Hernia repair     Family History  Problem Relation Age of Onset  . Stroke Mother   . Cancer Sister   . Diabetes Brother   . Alcohol abuse Brother    History  Substance Use Topics  . Smoking status: Former Smoker    Types: Cigarettes    Quit date: 12/06/1997  . Smokeless tobacco: Not on file  . Alcohol Use: No    Review of Systems  Ten systems reviewed and are negative for acute change, except as noted in the HPI.    Allergies  Review of patient's allergies indicates no known allergies.  Home Medications   Prior to Admission medications   Medication Sig Start Date End Date Taking? Authorizing Provider  amoxicillin (AMOXIL) 500 MG capsule  10/22/14   Historical Provider, MD  chlorhexidine (PERIDEX) 0.12 % solution  10/22/14  Historical Provider, MD  clopidogrel (PLAVIX) 75 MG tablet Take 75 mg by mouth daily.    Historical Provider, MD  [facility] BISACODYL 5 MG EC tablet See admin instructions. 09/10/14   Historical Provider, MD  darunavir-cobicistat (PREZCOBIX) 800-150 MG per tablet Take 1 tablet by mouth daily. Swallow whole. Do NOT crush, break or chew tablets. Take with food. 12/09/14   Carlyle Basques, MD  emtricitabine-tenofovir (TRUVADA) 200-300 MG per tablet Take 1 tablet by mouth daily. 09/02/14   Carlyle Basques, MD  HYDROcodone-acetaminophen (NORCO) 10-325 MG per  tablet Take 1 tablet by mouth every 6 (six) hours as needed.    Historical Provider, MD  polyethylene glycol-electrolytes (NULYTELY/GOLYTELY) 420 G solution See admin instructions. 10/07/14   Historical Provider, MD   BP 116/75 mmHg  Pulse 95  Temp(Src) 98.7 F (37.1 C) (Oral)  Resp 18  SpO2 100% Physical Exam  Constitutional: He is oriented to person, place, and time. He appears well-developed and well-nourished. No distress.  Very thin male in no acute distress. Tearful  HENT:  Head: Normocephalic and atraumatic.  Eyes: Conjunctivae are normal. No scleral icterus.  Neck: Normal range of motion. Neck supple.  Cardiovascular: Normal rate, regular rhythm and normal heart sounds.   Pulmonary/Chest: Effort normal and breath sounds normal. No respiratory distress. He has no wheezes. He exhibits no tenderness.  Abdominal: Soft. There is no tenderness.  Musculoskeletal: He exhibits no edema.  Neurological: He is alert and oriented to person, place, and time.  Skin: Skin is warm and dry. He is not diaphoretic.  Psychiatric: His behavior is normal.  Nursing note and vitals reviewed.   ED Course  Procedures (including critical care time) Labs Review Labs Reviewed - No data to display  Imaging Review No results found.   EKG Interpretation None      Date: _0 (<PARAMETER> error)@  Rate: 94  Rhythm: normal sinus rhythm  QRS Axis: normal  Intervals: normal  ST/T Wave abnormalities: normal  Conduction Disutrbances:right bundle branch block and left anterior fascicular block  Narrative Interpretation:   Old EKG Reviewed: none available    MDM   Final diagnoses:  Chest pain  Dyspnea   4:11 PM BP 116/75 mmHg  Pulse 95  Temp(Src) 98.7 F (37.1 C) (Oral)  Resp 18  SpO2 100%] Patient here after chest pain during procedure for 2-D echocardiogram. If concerned that he may have recurrence of his multiple myeloma causing his symptoms , weight loss, shortness of breath and  bone pain. Patient has a pacemaker in place. EKG does not show any current signs of ischemia. He is chest pain-free at this time. He ACS shortness of breath workup are pending.  Patient cxr negative, Negative troponin. Will obtain CT angio to r/o pe   CT shows multiple lytic lesions consistent with return of his multiple myeloma.  Patient is able to ambulate in the ED keeping 02 sats >90%. Patient previously evaluated by Dr. Alen Blew, I have messaged Dr. Alen Blew concerning the change in his cancer status and asked the the patient be set up for management. Patient will also receive a referral at discharge.He will be given pain medication at discharge. Follow closely with oncology. Patient seen in shared visit with attending physician. I discussed all findings with the patient and answered all questions to the best of my ability.  I personally reviewed the imaging tests through PACS system. I have reviewed and interpreted Lab values. I reviewed available ER/hospitalization records through the EMR  Abigail Harris, PA-C 12/21/14 1658  Brian D Miller, MD 12/21/14 2123 

## 2014-12-19 NOTE — ED Notes (Signed)
Ambulated patient in hallway. O2 saturation maintained between 99-100%. HR started at 92 bpm at the beginning and 111 at the end of the ambulation.  RR increased from 12-30 by the end with patient recovery to 12 RR when at rest in the room.

## 2014-12-19 NOTE — ED Notes (Signed)
Per EMS, pt comes from [facility]. Pt attempted to have ECHO, pt reports pain being unbearable while laying flat. Pt A&OX4, NAD noted. Pt given 324mg  ASA PO. Pt does not c/o chest pain sitting up. Pt has exertional SOB. VSS: BP 150/70, P98, R18, O2 100% rm air. 18g IV placed in left AC.

## 2014-12-19 NOTE — ED Notes (Signed)
Patient still out at [place].

## 2014-12-19 NOTE — Telephone Encounter (Signed)
Patient arrive for scheduled Echo ordered by Dr. Baxter Flattery for chest pain. Patient complained of chest pain 9 out of 10, on pain scale while laying down for echo. At 2:39 echo tech. called triage nurse for advise. Bernardo Heater RN and Ewell Poe RN went to assess patient. Patient was sitting in the chair complaining of chest pain 6 out of 10, on pain scale while sitting. No complaints of SOB and pain is not radiating. On assessment respirations 24, lungs diminished bilaterally with crackles in lower right lobe, O2 100% on room air. Patient complained of nausea with mouth filling up with saliva like he was going to vomit. Patient was shaky while he was being assessed. Patient had driven himself to office. Patient was not able to drive himself to ED. Vital signs are BP 150/70 and HR 98. Patient sent to hospital ED by EMS for evaluation and treatment.

## 2014-12-20 ENCOUNTER — Other Ambulatory Visit: Payer: Self-pay | Admitting: Oncology

## 2014-12-20 ENCOUNTER — Telehealth: Payer: Self-pay | Admitting: Oncology

## 2014-12-20 DIAGNOSIS — C9002 Multiple myeloma in relapse: Secondary | ICD-10-CM

## 2014-12-20 LAB — PATHOLOGIST SMEAR REVIEW

## 2014-12-20 NOTE — Telephone Encounter (Signed)
, °

## 2014-12-24 ENCOUNTER — Telehealth: Payer: Self-pay | Admitting: Oncology

## 2014-12-24 ENCOUNTER — Other Ambulatory Visit (HOSPITAL_BASED_OUTPATIENT_CLINIC_OR_DEPARTMENT_OTHER): Payer: Medicare Other

## 2014-12-24 ENCOUNTER — Ambulatory Visit (HOSPITAL_BASED_OUTPATIENT_CLINIC_OR_DEPARTMENT_OTHER): Payer: Medicare Other | Admitting: Oncology

## 2014-12-24 ENCOUNTER — Ambulatory Visit (HOSPITAL_BASED_OUTPATIENT_CLINIC_OR_DEPARTMENT_OTHER): Payer: Medicare Other

## 2014-12-24 VITALS — BP 120/82 | HR 98 | Temp 97.5°F | Resp 18 | Ht 69.0 in | Wt 132.6 lb

## 2014-12-24 DIAGNOSIS — C9002 Multiple myeloma in relapse: Secondary | ICD-10-CM

## 2014-12-24 DIAGNOSIS — B20 Human immunodeficiency virus [HIV] disease: Secondary | ICD-10-CM

## 2014-12-24 DIAGNOSIS — Z95828 Presence of other vascular implants and grafts: Secondary | ICD-10-CM

## 2014-12-24 LAB — CBC WITH DIFFERENTIAL/PLATELET
BASO%: 0.7 % (ref 0.0–2.0)
Basophils Absolute: 0 10*3/uL (ref 0.0–0.1)
EOS ABS: 0.1 10*3/uL (ref 0.0–0.5)
EOS%: 2.3 % (ref 0.0–7.0)
HCT: 38.8 % (ref 38.4–49.9)
HEMOGLOBIN: 12.2 g/dL — AB (ref 13.0–17.1)
LYMPH#: 2.8 10*3/uL (ref 0.9–3.3)
LYMPH%: 64.6 % — AB (ref 14.0–49.0)
MCH: 28.5 pg (ref 27.2–33.4)
MCHC: 31.5 g/dL — AB (ref 32.0–36.0)
MCV: 90.4 fL (ref 79.3–98.0)
MONO#: 0.4 10*3/uL (ref 0.1–0.9)
MONO%: 9.6 % (ref 0.0–14.0)
NEUT%: 22.8 % — AB (ref 39.0–75.0)
NEUTROS ABS: 1 10*3/uL — AB (ref 1.5–6.5)
PLATELETS: 181 10*3/uL (ref 140–400)
RBC: 4.3 10*6/uL (ref 4.20–5.82)
RDW: 13.7 % (ref 11.0–14.6)
WBC: 4.3 10*3/uL (ref 4.0–10.3)

## 2014-12-24 LAB — COMPREHENSIVE METABOLIC PANEL (CC13)
ALBUMIN: 4.1 g/dL (ref 3.5–5.0)
ALT: 20 U/L (ref 0–55)
AST: 25 U/L (ref 5–34)
Alkaline Phosphatase: 60 U/L (ref 40–150)
Anion Gap: 9 mEq/L (ref 3–11)
BILIRUBIN TOTAL: 0.58 mg/dL (ref 0.20–1.20)
BUN: 5.8 mg/dL — AB (ref 7.0–26.0)
CO2: 26 meq/L (ref 22–29)
Calcium: 9.5 mg/dL (ref 8.4–10.4)
Chloride: 106 mEq/L (ref 98–109)
Creatinine: 0.8 mg/dL (ref 0.7–1.3)
Glucose: 115 mg/dl (ref 70–140)
POTASSIUM: 4.1 meq/L (ref 3.5–5.1)
Sodium: 141 mEq/L (ref 136–145)
Total Protein: 6.2 g/dL — ABNORMAL LOW (ref 6.4–8.3)

## 2014-12-24 MED ORDER — DEXAMETHASONE 4 MG PO TABS: ORAL_TABLET | ORAL | Status: DC

## 2014-12-24 MED ORDER — HEPARIN SOD (PORK) LOCK FLUSH 100 UNIT/ML IV SOLN
500.0000 [IU] | Freq: Once | INTRAVENOUS | Status: AC
  Administered 2014-12-24: 500 [IU] via INTRAVENOUS
  Filled 2014-12-24: qty 5

## 2014-12-24 MED ORDER — SODIUM CHLORIDE 0.9 % IJ SOLN
10.0000 mL | INTRAMUSCULAR | Status: DC | PRN
  Administered 2014-12-24: 10 mL via INTRAVENOUS
  Filled 2014-12-24: qty 10

## 2014-12-24 NOTE — Progress Notes (Signed)
Hematology and Oncology Follow Up Visit  Rick Potter [id number] February 28, 1946 69 y.o. 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 He presented with lytic bone lesions and 84% plasma infiltration in his bone marrow. No heavy chain was detected. He also has HIV disease under control.  Prior Therapy: He was treated in the [facility] area initially with a Velcade-based regimen and had an excellent response with about 4% bone marrow residual disease. In April of 2014 he developed relapsed and was treated with Revlimid, Velcade and dexamethasone regimen. He had an excellent response with his kappa free light chain close to 60 mg/dL. He is planned to have maintenance Velcade treatment even his poor tolerance to Revlimid. He recently relocated to [place] to be close to family.   Current therapy: Observation and surveillance.  Interim History:  Mr. Schoffstall presents today for a followup visit. Since his last visit, he started developing worsening bone pain. His pain is predominantly chest wall and arm pain. He was evaluated in the emergency department on 12/19/2014 and had a CT scan of the chest on. There was no evidence of pulmonary embolism but showed extensive bony metastasis. He continues to report intermittent chest wall pain it varies induration and exacerbated mostly at night. He has reported post prandial pain for which she has a reflux medication. His appetite have also decreased and how lost some weight. He also have reported more fatigue and tiredness. He Is not reporting any headaches or blurry vision or syncope. Is not reporting any shortness of breath or cough or hemoptysis. Does not report any nausea or vomiting abdominal pain. Has not reported any hematochezia or melena. He reports no frequency urgency or hesitancy.  Rest of his review of systems  unremarkable.  Medications: I have reviewed the patient's current medications.  Current Outpatient Prescriptions  Medication Sig Dispense Refill  . clopidogrel (PLAVIX) 75 MG tablet Take 75 mg by mouth daily.    . darunavir-cobicistat (PREZCOBIX) 800-150 MG per tablet Take 1 tablet by mouth daily. Swallow whole. Do NOT crush, break or chew tablets. Take with food. 30 tablet 11  . emtricitabine-tenofovir (TRUVADA) 200-300 MG per tablet Take 1 tablet by mouth daily. 30 tablet 11  . HYDROcodone-acetaminophen (NORCO) 10-325 MG per tablet Take 1 tablet by mouth every 6 (six) hours as needed.    Marland Kitchen oxyCODONE-acetaminophen (PERCOCET/ROXICET) 5-325 MG per tablet Take by mouth every 4 (four) hours as needed.  0  . RABEprazole (ACIPHEX) 20 MG tablet Take 20 mg by mouth daily.  2  . dexamethasone (DECADRON) 4 MG tablet Take 5 tablets weekly with chemotherapy. 40 tablet 3   No current facility-administered medications for this visit.     Allergies: No Known Allergies  Past Medical History, Surgical history, Social history, and Family History were reviewed and updated.   Physical Exam: Blood pressure 120/82, pulse 98, temperature 97.5 F (36.4 C), temperature source Oral, resp. rate 18, height _0  (1.753 m), weight 132 lb 9.6 oz (60.147 kg), SpO2 100 %. ECOG: 1 General appearance: alert and cooperative did not appear any distress. Head: Normocephalic, without obvious abnormality Neck: no adenopathy Lymph nodes: Cervical, supraclavicular, and axillary nodes normal. Heart:regular rate and rhythm, S1, S2 normal, no murmur, click, rub or gallop  Chest wall examination: Could not reproduce chest wall pain. Lung:chest clear, no wheezing, rales, normal symmetric air entry Abdomin: soft, non-tender, without masses or organomegaly EXT:no  erythema, induration, or nodules   Lab Results: Lab Results  Component Value Date   WBC 4.3 12/24/2014   HGB 12.2* 12/24/2014   HCT 38.8 12/24/2014   MCV 90.4  12/24/2014   PLT 181 12/24/2014     Chemistry      Component Value Date/Time   NA 141 12/24/2014 1212   NA 140 12/19/2014 1633   K 4.1 12/24/2014 1212   K 4.4 12/19/2014 1633   CL 104 12/19/2014 1633   CO2 26 12/24/2014 1212   CO2 30 12/19/2014 1633   BUN 5.8* 12/24/2014 1212   BUN <5* 12/19/2014 1633   CREATININE 0.8 12/24/2014 1212   CREATININE 0.87 12/19/2014 1633   CREATININE 0.91 12/09/2014 1100      Component Value Date/Time   CALCIUM 9.5 12/24/2014 1212   CALCIUM 9.8 12/19/2014 1633   ALKPHOS 60 12/24/2014 1212   ALKPHOS 59 12/09/2014 1100   AST 25 12/24/2014 1212   AST 30 12/09/2014 1100   ALT 20 12/24/2014 1212   ALT 23 12/09/2014 1100   BILITOT 0.58 12/24/2014 1212   BILITOT 3.9* 12/09/2014 1100       Results for DE, JAWORSKI (MRN [id number]) as of 12/24/2014 12:07  Ref. Range 05/29/2014 13:20 07/12/2014 09:44 09/20/2014 10:04 11/13/2014 10:20  Kappa free light chain Latest Range: 0.33-1.94 mg/dL 89.20 (H) 107.00 (H) 203.00 (H) 386.00 (H)      EXAM: CT ANGIOGRAPHY CHEST WITH CONTRAST  TECHNIQUE: Multidetector CT imaging of the chest was performed using the standard protocol during bolus administration of intravenous contrast. Multiplanar CT image reconstructions and MIPs were obtained to evaluate the vascular anatomy.  CONTRAST: 125m OMNIPAQUE IOHEXOL 350 MG/ML SOLN  COMPARISON: Radiographs from earlier the same day and 12/17/2014  FINDINGS: There are no filling defects within the pulmonary arteries to suggest pulmonary embolus. There is no right heart strain. The thoracic aorta is normal in caliber with mild atherosclerosis. No gross dissection. Distal thoracic aorta is tortuous. Right internal jugular port tip in the distal SVC. Left-sided pacemaker with leads in the right atrium and ventricle.  The heart size is normal. There is no pleural or pericardial effusion. Faint coronary artery calcifications are seen. There is no mediastinal  or hilar adenopathy. Elevation of right hemidiaphragm. There patchy and ground-glass opacities in the anterior lower most right middle lobe and posterior basal right lower lobe. The left lung is clear. No pulmonary nodule.  No acute abnormality in the included upper abdomen.  The bones are diffusely under mineralized. There are multiple lytic lesions throughout the included thoracic skeleton consistent with multiple myeloma. For example ill-defined lesion is seen in the manubrium of the sternum. Expansile lesion right posterior sixth rib at the costovertebral junction. There are multiple lesions throughout the included thoracic spine. There is vertebral body involvement of near entire thoracic vertebral level. Many of these lesions also involve the posterior elements. There is no compression fracture. There is posterior cortex involvement that T10 and possibly T12.  Review of the MIP images confirms the above findings.  IMPRESSION: 1. No pulmonary embolus. 2. Faint ground-glass opacities in the posterior basal right lower lobe the and inferior right middle lobe. This may reflect atelectasis versus focal pneumonitis. 3. Multifocal osseous lesions consistent with multiple myeloma. There is osseous involvement of the vertebral bodies of near every thoracic level with posterior cortex involvement at T10 and possibly T12. Multifocal posterior element involvement. An expansile lesion of also right posterior sixth rib. There is sternal involvement. No definite  pathologic fracture. These bone lesions are not well seen radiographically.    Impression and Plan:  69 year old gentleman with the following issues:   1. 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 He is status post of Velcade-based regimen and subsequently received Velcade with Revlimid and tolerated it poorly.  His light chains have been increasing in the last few months and now he is symptomatic.  Imaging studies did show multifocal osseous lesions. I feel that it is imperative that he start sometime myeloma treatments to limit any organ damage associated with this disease.  Options of treatments were discussed today these would include Velcade based regimens Kyprolis. He did have a good response to Velcade treatments in the past and it would be reasonable to start that again. The risks and complications associated with Velcade with dexamethasone were discussed today. These include nausea, vomiting, peripheral neuropathy, pancreatitis among other complications. Complications from steroids include hyperglycemia and insomnia were discussed. I anticipate starting therapy in the next week after chemotherapy education class.  2. HIV disease: Under reasonable control followed by infectious disease.  3. Port-A-Cath management: it will be flushed with chemotherapy.  4. Bone pain: Related to progression of multiple myeloma. He has hydrocodone for pain. I anticipate this will improve with anticancer therapy.  5. Bone directed therapy: He will be candidate to start Zometa but I would like to assess for any complications from Velcade before starting that.  6. Follow-up: I will assess him in 3 weeks after Velcade treatments.  Zola Button, MD 1/19/20161:01 PM

## 2014-12-24 NOTE — Patient Instructions (Signed)

## 2014-12-24 NOTE — Telephone Encounter (Signed)
gv adn printed appt sched and avs for pt for Jan and Feb....sed added tx. °

## 2014-12-25 LAB — KAPPA/LAMBDA LIGHT CHAINS
Kappa free light chain: 386 mg/dL — ABNORMAL HIGH (ref 0.33–1.94)
Kappa:Lambda Ratio: 244.3 — ABNORMAL HIGH (ref 0.26–1.65)
Lambda Free Lght Chn: 1.58 mg/dL (ref 0.57–2.63)

## 2014-12-31 ENCOUNTER — Other Ambulatory Visit: Payer: Medicare Other

## 2014-12-31 ENCOUNTER — Ambulatory Visit (HOSPITAL_BASED_OUTPATIENT_CLINIC_OR_DEPARTMENT_OTHER): Payer: Medicare Other

## 2014-12-31 ENCOUNTER — Other Ambulatory Visit (HOSPITAL_BASED_OUTPATIENT_CLINIC_OR_DEPARTMENT_OTHER): Payer: Medicare Other

## 2014-12-31 ENCOUNTER — Ambulatory Visit: Payer: Medicare Other

## 2014-12-31 ENCOUNTER — Encounter: Payer: Self-pay | Admitting: *Deleted

## 2014-12-31 ENCOUNTER — Other Ambulatory Visit: Payer: Self-pay | Admitting: *Deleted

## 2014-12-31 DIAGNOSIS — C9002 Multiple myeloma in relapse: Secondary | ICD-10-CM

## 2014-12-31 DIAGNOSIS — Z5112 Encounter for antineoplastic immunotherapy: Secondary | ICD-10-CM

## 2014-12-31 LAB — CBC WITH DIFFERENTIAL/PLATELET
BASO%: 0.2 % (ref 0.0–2.0)
Basophils Absolute: 0 10*3/uL (ref 0.0–0.1)
EOS ABS: 0 10*3/uL (ref 0.0–0.5)
EOS%: 0.2 % (ref 0.0–7.0)
HCT: 37 % — ABNORMAL LOW (ref 38.4–49.9)
HGB: 12.5 g/dL — ABNORMAL LOW (ref 13.0–17.1)
LYMPH%: 34 % (ref 14.0–49.0)
MCH: 29.4 pg (ref 27.2–33.4)
MCHC: 33.8 g/dL (ref 32.0–36.0)
MCV: 87.1 fL (ref 79.3–98.0)
MONO#: 0.2 10*3/uL (ref 0.1–0.9)
MONO%: 2.9 % (ref 0.0–14.0)
NEUT#: 3.2 10*3/uL (ref 1.5–6.5)
NEUT%: 62.7 % (ref 39.0–75.0)
Platelets: 154 10*3/uL (ref 140–400)
RBC: 4.25 10*6/uL (ref 4.20–5.82)
RDW: 12.7 % (ref 11.0–14.6)
WBC: 5.2 10*3/uL (ref 4.0–10.3)
lymph#: 1.8 10*3/uL (ref 0.9–3.3)

## 2014-12-31 LAB — COMPREHENSIVE METABOLIC PANEL (CC13)
ALBUMIN: 4.2 g/dL (ref 3.5–5.0)
ALT: 35 U/L (ref 0–55)
AST: 49 U/L — AB (ref 5–34)
Alkaline Phosphatase: 58 U/L (ref 40–150)
Anion Gap: 12 mEq/L — ABNORMAL HIGH (ref 3–11)
BUN: 9.6 mg/dL (ref 7.0–26.0)
CO2: 22 meq/L (ref 22–29)
Calcium: 9.3 mg/dL (ref 8.4–10.4)
Chloride: 103 mEq/L (ref 98–109)
Creatinine: 0.9 mg/dL (ref 0.7–1.3)
GLUCOSE: 152 mg/dL — AB (ref 70–140)
Potassium: 4.2 mEq/L (ref 3.5–5.1)
Sodium: 138 mEq/L (ref 136–145)
Total Bilirubin: 1.04 mg/dL (ref 0.20–1.20)
Total Protein: 6.5 g/dL (ref 6.4–8.3)

## 2014-12-31 MED ORDER — SODIUM CHLORIDE 0.9 % IJ SOLN
10.0000 mL | INTRAMUSCULAR | Status: DC | PRN
  Administered 2014-12-31: 10 mL
  Filled 2014-12-31: qty 10

## 2014-12-31 MED ORDER — OXYCODONE-ACETAMINOPHEN 5-325 MG PO TABS
ORAL_TABLET | ORAL | Status: AC
  Filled 2014-12-31: qty 1

## 2014-12-31 MED ORDER — SODIUM CHLORIDE 0.9 % IV SOLN
Freq: Once | INTRAVENOUS | Status: AC
  Administered 2014-12-31: 12:00:00 via INTRAVENOUS

## 2014-12-31 MED ORDER — SODIUM CHLORIDE 0.9 % IJ SOLN
10.0000 mL | INTRAMUSCULAR | Status: DC | PRN
  Administered 2014-12-31: 10 mL via INTRAVENOUS
  Filled 2014-12-31: qty 10

## 2014-12-31 MED ORDER — PROCHLORPERAZINE MALEATE 10 MG PO TABS: 10.0000 mg | ORAL_TABLET | Freq: Four times a day (QID) | ORAL | Status: DC | PRN

## 2014-12-31 MED ORDER — HEPARIN SOD (PORK) LOCK FLUSH 100 UNIT/ML IV SOLN
500.0000 [IU] | Freq: Once | INTRAVENOUS | Status: AC | PRN
  Administered 2014-12-31: 500 [IU]
  Filled 2014-12-31: qty 5

## 2014-12-31 MED ORDER — ACYCLOVIR 400 MG PO TABS
400.0000 mg | ORAL_TABLET | Freq: Two times a day (BID) | ORAL | Status: DC
Start: 2014-12-31 — End: 2015-01-14

## 2014-12-31 MED ORDER — HEPARIN SOD (PORK) LOCK FLUSH 100 UNIT/ML IV SOLN
500.0000 [IU] | Freq: Once | INTRAVENOUS | Status: AC
  Administered 2014-12-31: 500 [IU] via INTRAVENOUS
  Filled 2014-12-31: qty 5

## 2014-12-31 MED ORDER — ONDANSETRON 8 MG/NS 50 ML IVPB
INTRAVENOUS | Status: AC
  Filled 2014-12-31: qty 8

## 2014-12-31 MED ORDER — ONDANSETRON 8 MG/50ML IVPB (CHCC)
8.0000 mg | Freq: Once | INTRAVENOUS | Status: AC
  Administered 2014-12-31: 8 mg via INTRAVENOUS

## 2014-12-31 MED ORDER — OXYCODONE-ACETAMINOPHEN 5-325 MG PO TABS
1.0000 | ORAL_TABLET | Freq: Once | ORAL | Status: AC
  Administered 2014-12-31: 1 via ORAL

## 2014-12-31 MED ORDER — BORTEZOMIB CHEMO IV INJECTION 3.5 MG
1.3000 mg/m2 | Freq: Once | INTRAMUSCULAR | Status: AC
Start: 2014-12-31 — End: 2014-12-31
  Administered 2014-12-31: 2.2 mg via INTRAVENOUS
  Filled 2014-12-31: qty 2.2

## 2014-12-31 NOTE — Patient Instructions (Addendum)
[facility] Discharge Instructions for Patients Receiving Chemotherapy  Today you received the following chemotherapy agent: Velcade   To help prevent nausea and vomiting after your treatment, we encourage you to take your nausea medication as prescribed. Nausea medication should be ready for pick up at your preferred pharmacy this evening.    If you develop nausea and vomiting that is not controlled by your nausea medication, call the clinic.   BELOW ARE SYMPTOMS THAT SHOULD BE REPORTED IMMEDIATELY:  *FEVER GREATER THAN 100.5 F  *CHILLS WITH OR WITHOUT FEVER  NAUSEA AND VOMITING THAT IS NOT CONTROLLED WITH YOUR NAUSEA MEDICATION  *UNUSUAL SHORTNESS OF BREATH  *UNUSUAL BRUISING OR BLEEDING  TENDERNESS IN MOUTH AND THROAT WITH OR WITHOUT PRESENCE OF ULCERS  *URINARY PROBLEMS  *BOWEL PROBLEMS  UNUSUAL RASH Items with * indicate a potential emergency and should be followed up as soon as possible.  Feel free to call the clinic you have any questions or concerns. The clinic phone number is [phone].

## 2014-12-31 NOTE — Patient Instructions (Signed)

## 2014-12-31 NOTE — Progress Notes (Signed)
Patient reports he is very depressed today and "today is a bad day." He is tearful and sad. Pt refuses resources such as chaplain or social work. He attended chemo education class this morning prior to first dose and is aware of support groups. Pt reports 10/10 chest and back pain; these are sites of chronic pain. Pt did not bring his home supply of pain meds with him, educated to do so in the future. Awilda Metro, PA gave verbal order for percocet 5-325 x1 while in infusion room.

## 2014-12-31 NOTE — Telephone Encounter (Signed)
Spoke with patient. He has 2 prescriptions to p/u at his pharmacy. Acyclovir and compazine, per dr Alen Blew.

## 2015-01-01 ENCOUNTER — Telehealth: Payer: Self-pay | Admitting: *Deleted

## 2015-01-01 NOTE — Telephone Encounter (Signed)
Triage follow up call: Patient reports no current symptoms and does not have any questions at this time. Instructed patient to call if they have any questions or symptoms that arise. Patient verbalized understanding.   

## 2015-01-06 ENCOUNTER — Ambulatory Visit: Payer: MEDICARE

## 2015-01-07 ENCOUNTER — Other Ambulatory Visit (HOSPITAL_BASED_OUTPATIENT_CLINIC_OR_DEPARTMENT_OTHER): Payer: Medicare Other

## 2015-01-07 ENCOUNTER — Ambulatory Visit: Payer: MEDICARE | Admitting: Internal Medicine

## 2015-01-07 ENCOUNTER — Ambulatory Visit (HOSPITAL_BASED_OUTPATIENT_CLINIC_OR_DEPARTMENT_OTHER): Payer: Medicare Other

## 2015-01-07 DIAGNOSIS — C9002 Multiple myeloma in relapse: Secondary | ICD-10-CM

## 2015-01-07 DIAGNOSIS — Z5112 Encounter for antineoplastic immunotherapy: Secondary | ICD-10-CM

## 2015-01-07 LAB — CBC WITH DIFFERENTIAL/PLATELET
BASO%: 0.3 % (ref 0.0–2.0)
Basophils Absolute: 0 10*3/uL (ref 0.0–0.1)
EOS%: 0.8 % (ref 0.0–7.0)
Eosinophils Absolute: 0 10*3/uL (ref 0.0–0.5)
HEMATOCRIT: 36.2 % — AB (ref 38.4–49.9)
HGB: 12.3 g/dL — ABNORMAL LOW (ref 13.0–17.1)
LYMPH%: 65.9 % — AB (ref 14.0–49.0)
MCH: 29.9 pg (ref 27.2–33.4)
MCHC: 34 g/dL (ref 32.0–36.0)
MCV: 87.9 fL (ref 79.3–98.0)
MONO#: 0.3 10*3/uL (ref 0.1–0.9)
MONO%: 8.7 % (ref 0.0–14.0)
NEUT#: 1 10*3/uL — ABNORMAL LOW (ref 1.5–6.5)
NEUT%: 24.3 % — ABNORMAL LOW (ref 39.0–75.0)
Platelets: 141 10*3/uL (ref 140–400)
RBC: 4.12 10*6/uL — ABNORMAL LOW (ref 4.20–5.82)
RDW: 12.6 % (ref 11.0–14.6)
WBC: 3.9 10*3/uL — ABNORMAL LOW (ref 4.0–10.3)
lymph#: 2.6 10*3/uL (ref 0.9–3.3)

## 2015-01-07 LAB — COMPREHENSIVE METABOLIC PANEL (CC13)
ALT: 32 U/L (ref 0–55)
AST: 26 U/L (ref 5–34)
Albumin: 4.1 g/dL (ref 3.5–5.0)
Alkaline Phosphatase: 56 U/L (ref 40–150)
Anion Gap: 9 mEq/L (ref 3–11)
BUN: 9.5 mg/dL (ref 7.0–26.0)
CALCIUM: 9 mg/dL (ref 8.4–10.4)
CHLORIDE: 106 meq/L (ref 98–109)
CO2: 24 mEq/L (ref 22–29)
Creatinine: 0.9 mg/dL (ref 0.7–1.3)
Glucose: 111 mg/dl (ref 70–140)
Potassium: 4.3 mEq/L (ref 3.5–5.1)
SODIUM: 139 meq/L (ref 136–145)
Total Bilirubin: 0.78 mg/dL (ref 0.20–1.20)
Total Protein: 6.1 g/dL — ABNORMAL LOW (ref 6.4–8.3)

## 2015-01-07 MED ORDER — SODIUM CHLORIDE 0.9 % IV SOLN
Freq: Once | INTRAVENOUS | Status: AC
  Administered 2015-01-07: 15:00:00 via INTRAVENOUS

## 2015-01-07 MED ORDER — SODIUM CHLORIDE 0.9 % IJ SOLN
10.0000 mL | INTRAMUSCULAR | Status: DC | PRN
  Administered 2015-01-07: 10 mL
  Filled 2015-01-07: qty 10

## 2015-01-07 MED ORDER — ONDANSETRON 8 MG/NS 50 ML IVPB
INTRAVENOUS | Status: AC
  Filled 2015-01-07: qty 8

## 2015-01-07 MED ORDER — ONDANSETRON 8 MG/50ML IVPB (CHCC)
8.0000 mg | Freq: Once | INTRAVENOUS | Status: AC
  Administered 2015-01-07: 8 mg via INTRAVENOUS

## 2015-01-07 MED ORDER — HEPARIN SOD (PORK) LOCK FLUSH 100 UNIT/ML IV SOLN
500.0000 [IU] | Freq: Once | INTRAVENOUS | Status: AC | PRN
  Administered 2015-01-07: 500 [IU]
  Filled 2015-01-07: qty 5

## 2015-01-07 MED ORDER — BORTEZOMIB CHEMO IV INJECTION 3.5 MG
1.3000 mg/m2 | Freq: Once | INTRAMUSCULAR | Status: AC
  Administered 2015-01-07: 2.2 mg via INTRAVENOUS
  Filled 2015-01-07: qty 2.2

## 2015-01-07 NOTE — Patient Instructions (Signed)
[facility] Discharge Instructions for Patients Receiving Chemotherapy  Today you received the following chemotherapy agents Velcade  To help prevent nausea and vomiting after your treatment, we encourage you to take your nausea medication as ordered.   If you develop nausea and vomiting that is not controlled by your nausea medication, call the clinic.   BELOW ARE SYMPTOMS THAT SHOULD BE REPORTED IMMEDIATELY:  *FEVER GREATER THAN 100.5 F  *CHILLS WITH OR WITHOUT FEVER  NAUSEA AND VOMITING THAT IS NOT CONTROLLED WITH YOUR NAUSEA MEDICATION  *UNUSUAL SHORTNESS OF BREATH  *UNUSUAL BRUISING OR BLEEDING  TENDERNESS IN MOUTH AND THROAT WITH OR WITHOUT PRESENCE OF ULCERS  *URINARY PROBLEMS  *BOWEL PROBLEMS  UNUSUAL RASH Items with * indicate a potential emergency and should be followed up as soon as possible.  Feel free to call the clinic you have any questions or concerns. The clinic phone number is [phone].

## 2015-01-07 NOTE — Progress Notes (Signed)
1445 Per Dr.Shadad, proceed with treatment, aware of ANC 1. 1600 Aware of B/P decreased. No new orders.

## 2015-01-13 ENCOUNTER — Telehealth: Payer: Self-pay | Admitting: *Deleted

## 2015-01-13 NOTE — Telephone Encounter (Signed)
Received a call from [facility] in response to an ROM and they advised they never did a Genotype on this patient. Will let Dr Baxter Flattery know and see if she wants to draw on the patient at his next lab visit.

## 2015-01-14 ENCOUNTER — Ambulatory Visit (HOSPITAL_BASED_OUTPATIENT_CLINIC_OR_DEPARTMENT_OTHER): Payer: Medicare Other | Admitting: Oncology

## 2015-01-14 ENCOUNTER — Ambulatory Visit: Payer: Medicare Other

## 2015-01-14 ENCOUNTER — Other Ambulatory Visit (HOSPITAL_BASED_OUTPATIENT_CLINIC_OR_DEPARTMENT_OTHER): Payer: Medicare Other

## 2015-01-14 ENCOUNTER — Telehealth: Payer: Self-pay | Admitting: Oncology

## 2015-01-14 VITALS — BP 99/63 | HR 102 | Temp 97.6°F | Resp 18 | Ht 69.0 in | Wt 126.0 lb

## 2015-01-14 DIAGNOSIS — C9002 Multiple myeloma in relapse: Secondary | ICD-10-CM

## 2015-01-14 DIAGNOSIS — R079 Chest pain, unspecified: Secondary | ICD-10-CM

## 2015-01-14 DIAGNOSIS — Z95828 Presence of other vascular implants and grafts: Secondary | ICD-10-CM

## 2015-01-14 DIAGNOSIS — R1310 Dysphagia, unspecified: Secondary | ICD-10-CM

## 2015-01-14 DIAGNOSIS — B20 Human immunodeficiency virus [HIV] disease: Secondary | ICD-10-CM

## 2015-01-14 LAB — COMPREHENSIVE METABOLIC PANEL (CC13)
ALT: 40 U/L (ref 0–55)
AST: 29 U/L (ref 5–34)
Albumin: 4.1 g/dL (ref 3.5–5.0)
Alkaline Phosphatase: 58 U/L (ref 40–150)
Anion Gap: 8 mEq/L (ref 3–11)
BUN: 7.8 mg/dL (ref 7.0–26.0)
CO2: 27 mEq/L (ref 22–29)
Calcium: 9.5 mg/dL (ref 8.4–10.4)
Chloride: 107 mEq/L (ref 98–109)
Creatinine: 0.8 mg/dL (ref 0.7–1.3)
EGFR: >90 mL/min/{1.73_m2} (ref >90)
Glucose: 108 mg/dl (ref 70–140)
Potassium: 4 mEq/L (ref 3.5–5.1)
Sodium: 142 mEq/L (ref 136–145)
Total Bilirubin: 0.93 mg/dL (ref 0.20–1.20)
Total Protein: 6.2 g/dL — ABNORMAL LOW (ref 6.4–8.3)

## 2015-01-14 LAB — CBC WITH DIFFERENTIAL/PLATELET
BASO%: 0.4 % (ref 0.0–2.0)
Basophils Absolute: 0 10*3/uL (ref 0.0–0.1)
EOS ABS: 0 10*3/uL (ref 0.0–0.5)
EOS%: 1.6 % (ref 0.0–7.0)
HCT: 39.1 % (ref 38.4–49.9)
HEMOGLOBIN: 12.5 g/dL — AB (ref 13.0–17.1)
LYMPH%: 72.5 % — ABNORMAL HIGH (ref 14.0–49.0)
MCH: 28.8 pg (ref 27.2–33.4)
MCHC: 32 g/dL (ref 32.0–36.0)
MCV: 90.1 fL (ref 79.3–98.0)
MONO#: 0.3 10*3/uL (ref 0.1–0.9)
MONO%: 10.5 % (ref 0.0–14.0)
NEUT%: 15 % — ABNORMAL LOW (ref 39.0–75.0)
NEUTROS ABS: 0.4 10*3/uL — AB (ref 1.5–6.5)
Platelets: 149 10*3/uL (ref 140–400)
RBC: 4.33 10*6/uL (ref 4.20–5.82)
RDW: 14.3 % (ref 11.0–14.6)
WBC: 2.9 10*3/uL — ABNORMAL LOW (ref 4.0–10.3)
lymph#: 2.1 10*3/uL (ref 0.9–3.3)

## 2015-01-14 MED ORDER — SODIUM CHLORIDE 0.9 % IJ SOLN
10.0000 mL | INTRAMUSCULAR | Status: DC | PRN
  Administered 2015-01-14: 10 mL via INTRAVENOUS
  Filled 2015-01-14: qty 10

## 2015-01-14 MED ORDER — OXYCODONE-ACETAMINOPHEN 5-325 MG PO TABS: 1.0000 | ORAL_TABLET | ORAL | Status: DC | PRN

## 2015-01-14 NOTE — Patient Instructions (Signed)

## 2015-01-14 NOTE — Progress Notes (Signed)
Hematology and Oncology Follow Up Visit  Rick Potter [id number] 1946-10-22 69 y.o. 01/14/2015 1:06 PM Rick Potter, Rick Kaufmann, MD   Principle Diagnosis: 69 year old gentleman diagnosed with kappa light chain multiple myeloma in October of 2011. He presented with lytic bone lesions and 84% plasma infiltration in his bone marrow. No heavy chain was detected. He also has HIV disease under control.  Prior Therapy: He was treated in the [facility] area initially with a Velcade-based regimen and had an excellent response with about 4% bone marrow residual disease. In April of 2014 he developed relapsed and was treated with Revlimid, Velcade and dexamethasone regimen. He had an excellent response with his kappa free light chain close to 60 mg/dL. He is planned to have maintenance Velcade treatment even his poor tolerance to Revlimid. He recently relocated to [place] to be close to family.   Current therapy: Velcade and dexamethasone salvage chemotherapy started on 12/24/2014.  Interim History:  Rick Potter presents today for a followup visit. Since his last visit, he he started Velcade dexamethasone combination with few complications. He has reported some nausea and fatigue. He also have reported some peripheral neuropathy. He continues to have generalized weakness and continues to lose weight. His main issue continues to be post prandial pain. His pain is predominantly mid epigastric and certainly exacerbated by food. It sounds exacerbated by liquids and does not feel any food being stuck or regurgitation. His pain is rather severe and make some low-grade to eat.  He was evaluated in the emergency department on 12/19/2014 and had a CT scan of the chest on. There was no evidence of pulmonary embolism but showed extensive bony metastasis. He Is not reporting any headaches or blurry vision or syncope. Is not reporting any shortness of breath or cough or hemoptysis. Does not report any  nausea or vomiting abdominal pain. Has not reported any hematochezia or melena. He reports no frequency urgency or hesitancy.  Rest of his review of systems unremarkable.  Medications: I have reviewed the patient's current medications.  Current Outpatient Prescriptions  Medication Sig Dispense Refill  . clopidogrel (PLAVIX) 75 MG tablet Take 75 mg by mouth daily.    . darunavir-cobicistat (PREZCOBIX) 800-150 MG per tablet Take 1 tablet by mouth daily. Swallow whole. Do NOT crush, break or chew tablets. Take with food. 30 tablet 11  . dexamethasone (DECADRON) 4 MG tablet Take 5 tablets weekly with chemotherapy. 40 tablet 3  . emtricitabine-tenofovir (TRUVADA) 200-300 MG per tablet Take 1 tablet by mouth daily. 30 tablet 11  . HYDROcodone-acetaminophen (NORCO) 10-325 MG per tablet Take 1 tablet by mouth every 6 (six) hours as needed.    . prochlorperazine (COMPAZINE) 10 MG tablet Take 1 tablet (10 mg total) by mouth every 6 (six) hours as needed for nausea or vomiting. 30 tablet 0  . oxyCODONE-acetaminophen (PERCOCET/ROXICET) 5-325 MG per tablet Take 1 tablet by mouth every 4 (four) hours as needed. 30 tablet 0   No current facility-administered medications for this visit.     Allergies: No Known Allergies  Past Medical History, Surgical history, Social history, and Family History were reviewed and updated.   Physical Exam: Blood pressure 99/63, pulse 102, temperature 97.6 F (36.4 C), temperature source Oral, resp. rate 18, height 5' 9" (1.753 m), weight 126 lb (57.153 kg). ECOG: 1 General appearance: alert and cooperative did not appear any distress. He is overall more cachectic than usual. Head: Normocephalic, without obvious abnormality Neck: no adenopathy Lymph nodes: Cervical, supraclavicular, and axillary  nodes normal. Heart:regular rate and rhythm, S1, S2 normal, no murmur, click, rub or gallop  Chest wall examination: Could not reproduce chest wall pain. Lung:chest clear, no  wheezing, rales, normal symmetric air entry Abdomin: soft, non-tender, without masses or organomegaly EXT:no erythema, induration, or nodules   Lab Results: Lab Results  Component Value Date   WBC 2.9* 01/14/2015   HGB 12.5* 01/14/2015   HCT 39.1 01/14/2015   MCV 90.1 01/14/2015   PLT 149 01/14/2015     Chemistry      Component Value Date/Time   NA 142 01/14/2015 1219   NA 140 12/19/2014 1633   K 4.0 01/14/2015 1219   K 4.4 12/19/2014 1633   CL 104 12/19/2014 1633   CO2 27 01/14/2015 1219   CO2 30 12/19/2014 1633   BUN 7.8 01/14/2015 1219   BUN <5* 12/19/2014 1633   CREATININE 0.8 01/14/2015 1219   CREATININE 0.87 12/19/2014 1633   CREATININE 0.91 12/09/2014 1100      Component Value Date/Time   CALCIUM 9.5 01/14/2015 1219   CALCIUM 9.8 12/19/2014 1633   ALKPHOS 58 01/14/2015 1219   ALKPHOS 59 12/09/2014 1100   AST 29 01/14/2015 1219   AST 30 12/09/2014 1100   ALT 40 01/14/2015 1219   ALT 23 12/09/2014 1100   BILITOT 0.93 01/14/2015 1219   BILITOT 3.9* 12/09/2014 1100       Results for Rick Potter (MRN [id number]) as of 12/24/2014 12:07  Ref. Range 05/29/2014 13:20 07/12/2014 09:44 09/20/2014 10:04 11/13/2014 10:20  Kappa free light chain Latest Range: 0.33-1.94 mg/dL 89.20 (H) 107.00 (H) 203.00 (H) 386.00 (H)        Impression and Plan:  68-year-old gentleman with the following issues:   1. 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 He is status post of Velcade-based regimen and subsequently received Velcade with Revlimid and tolerated it poorly.   He is currently on Velcade and dexamethasone and have tolerated the intravenous Velcade with few complications. He has developed neutropenia as well. The plan is to hold Velcade therapy today and resume next week as scheduled but we will switch him to subcutaneous formulation for better tolerance. I think that will cause less cytopenias and better tolerance.  2. HIV disease: Under  reasonable control followed by infectious disease.  3. Port-A-Cath management: it will be flushed with every 16 weeks if not used for chemotherapy.  4. Chest pain: Etiology is unclear and certainly exacerbated by food. It is located in the lower chest/epigastric area. The differential diagnosis include peptic ulcer disease, esophageal mass or possibly bone pain related to multiple myeloma. He could also have a infection causing esophagitis and exacerbating his symptoms. He did have a CT scan images did not show any specific large tumor or mass lesions in that area. I will refer him to gastroenterology for evaluation and a possible endoscopy. In the meantime, I instructed him to increase his nutritional uptake by liquid and nutritional supplements.  I gave him a refill on Percocet for better pain control.  5. Bone directed therapy: He will be candidate to start Zometa but I would like to assess for any complications from Velcade before starting that.  6. Follow-up: In 3 weeks to assess his tolerance to this chemotherapy regimen.  SHADAD,FIRAS, MD 2/9/20161:06 PM 

## 2015-01-14 NOTE — Telephone Encounter (Signed)
Pt confirmed labs/ov per 02/09 POF, gave pt AVS..... KJ, sent msg to MD confirming change of Dr. For referral with Dr. Paulita Fujita, s/w Elie Confer to set up apt for referral, faxed over notes/demographics, pt confirmed apt with Dr. Arta Silence.Marland KitchenMarland KitchenMarland Kitchen

## 2015-01-21 ENCOUNTER — Ambulatory Visit: Payer: Medicare Other

## 2015-01-21 ENCOUNTER — Other Ambulatory Visit (HOSPITAL_BASED_OUTPATIENT_CLINIC_OR_DEPARTMENT_OTHER): Payer: Medicare Other

## 2015-01-21 ENCOUNTER — Ambulatory Visit (HOSPITAL_BASED_OUTPATIENT_CLINIC_OR_DEPARTMENT_OTHER): Payer: Medicare Other

## 2015-01-21 DIAGNOSIS — C9002 Multiple myeloma in relapse: Secondary | ICD-10-CM

## 2015-01-21 DIAGNOSIS — Z5112 Encounter for antineoplastic immunotherapy: Secondary | ICD-10-CM

## 2015-01-21 LAB — CBC WITH DIFFERENTIAL/PLATELET
BASO%: 0.6 % (ref 0.0–2.0)
Basophils Absolute: 0 10*3/uL (ref 0.0–0.1)
EOS ABS: 0 10*3/uL (ref 0.0–0.5)
EOS%: 1.2 % (ref 0.0–7.0)
HEMATOCRIT: 35.6 % — AB (ref 38.4–49.9)
HEMOGLOBIN: 11.3 g/dL — AB (ref 13.0–17.1)
LYMPH%: 51.3 % — AB (ref 14.0–49.0)
MCH: 28.5 pg (ref 27.2–33.4)
MCHC: 31.8 g/dL — ABNORMAL LOW (ref 32.0–36.0)
MCV: 89.6 fL (ref 79.3–98.0)
MONO#: 0.3 10*3/uL (ref 0.1–0.9)
MONO%: 9 % (ref 0.0–14.0)
NEUT%: 37.9 % — AB (ref 39.0–75.0)
NEUTROS ABS: 1.4 10*3/uL — AB (ref 1.5–6.5)
PLATELETS: 159 10*3/uL (ref 140–400)
RBC: 3.98 10*6/uL — ABNORMAL LOW (ref 4.20–5.82)
RDW: 14.1 % (ref 11.0–14.6)
WBC: 3.7 10*3/uL — AB (ref 4.0–10.3)
lymph#: 1.9 10*3/uL (ref 0.9–3.3)

## 2015-01-21 LAB — COMPREHENSIVE METABOLIC PANEL (CC13)
ALT: 41 U/L (ref 0–55)
ANION GAP: 10 meq/L (ref 3–11)
AST: 32 U/L (ref 5–34)
Albumin: 3.9 g/dL (ref 3.5–5.0)
Alkaline Phosphatase: 57 U/L (ref 40–150)
BUN: 7.9 mg/dL (ref 7.0–26.0)
CALCIUM: 9.4 mg/dL (ref 8.4–10.4)
CHLORIDE: 106 meq/L (ref 98–109)
CO2: 24 meq/L (ref 22–29)
CREATININE: 0.9 mg/dL (ref 0.7–1.3)
EGFR: >90 mL/min/{1.73_m2} (ref >90)
Glucose: 115 mg/dl (ref 70–140)
Potassium: 3.6 mEq/L (ref 3.5–5.1)
SODIUM: 141 meq/L (ref 136–145)
TOTAL PROTEIN: 5.8 g/dL — AB (ref 6.4–8.3)
Total Bilirubin: 0.81 mg/dL (ref 0.20–1.20)

## 2015-01-21 MED ORDER — ONDANSETRON HCL 8 MG PO TABS
8.0000 mg | ORAL_TABLET | Freq: Once | ORAL | Status: AC
  Administered 2015-01-21: 8 mg via ORAL

## 2015-01-21 MED ORDER — HEPARIN SOD (PORK) LOCK FLUSH 100 UNIT/ML IV SOLN
500.0000 [IU] | Freq: Once | INTRAVENOUS | Status: AC
  Administered 2015-01-21: 500 [IU] via INTRAVENOUS
  Filled 2015-01-21: qty 5

## 2015-01-21 MED ORDER — SODIUM CHLORIDE 0.9 % IJ SOLN
10.0000 mL | INTRAMUSCULAR | Status: DC | PRN
  Administered 2015-01-21: 10 mL via INTRAVENOUS
  Filled 2015-01-21: qty 10

## 2015-01-21 MED ORDER — ONDANSETRON HCL 8 MG PO TABS
ORAL_TABLET | ORAL | Status: AC
  Filled 2015-01-21: qty 1

## 2015-01-21 MED ORDER — BORTEZOMIB CHEMO SQ INJECTION 3.5 MG (2.5MG/ML)
1.3000 mg/m2 | Freq: Once | INTRAMUSCULAR | Status: AC
  Administered 2015-01-21: 2.25 mg via SUBCUTANEOUS
  Filled 2015-01-21: qty 2.25

## 2015-01-21 NOTE — Patient Instructions (Signed)
[facility] Discharge Instructions for Patients Receiving Chemotherapy  Today you received the following chemotherapy agents:  Velcade  To help prevent nausea and vomiting after your treatment, we encourage you to take your nausea medication as ordered per MD.   If you develop nausea and vomiting that is not controlled by your nausea medication, call the clinic.   BELOW ARE SYMPTOMS THAT SHOULD BE REPORTED IMMEDIATELY:  *FEVER GREATER THAN 100.5 F  *CHILLS WITH OR WITHOUT FEVER  NAUSEA AND VOMITING THAT IS NOT CONTROLLED WITH YOUR NAUSEA MEDICATION  *UNUSUAL SHORTNESS OF BREATH  *UNUSUAL BRUISING OR BLEEDING  TENDERNESS IN MOUTH AND THROAT WITH OR WITHOUT PRESENCE OF ULCERS  *URINARY PROBLEMS  *BOWEL PROBLEMS  UNUSUAL RASH Items with * indicate a potential emergency and should be followed up as soon as possible.  Feel free to call the clinic you have any questions or concerns. The clinic phone number is [phone].    

## 2015-01-21 NOTE — Progress Notes (Signed)
OK to treat with ANC 1.4 per Dr Shadad 

## 2015-01-21 NOTE — Patient Instructions (Signed)

## 2015-01-23 LAB — SPEP & IFE WITH QIG
ALPHA-2-GLOBULIN: 7.2 % (ref 7.1–11.8)
Albumin ELP: 71.1 % — ABNORMAL HIGH (ref 55.8–66.1)
Alpha-1-Globulin: 3.5 % (ref 2.9–4.9)
BETA 2: 3.3 % (ref 3.2–6.5)
Beta Globulin: 6.3 % (ref 4.7–7.2)
Gamma Globulin: 8.6 % — ABNORMAL LOW (ref 11.1–18.8)
IgA: 10 mg/dL — ABNORMAL LOW (ref 68–379)
IgG (Immunoglobin G), Serum: 568 mg/dL — ABNORMAL LOW (ref 650–1600)
M-Spike, %: 0.13 g/dL
Total Protein, Serum Electrophoresis: 5.8 g/dL — ABNORMAL LOW (ref 6.0–8.3)

## 2015-01-23 LAB — KAPPA/LAMBDA LIGHT CHAINS
Kappa free light chain: 270 mg/dL — ABNORMAL HIGH (ref 0.33–1.94)
Lambda Free Lght Chn: 0.14 mg/dL — ABNORMAL LOW (ref 0.57–2.63)

## 2015-01-29 ENCOUNTER — Ambulatory Visit (HOSPITAL_BASED_OUTPATIENT_CLINIC_OR_DEPARTMENT_OTHER): Payer: Medicare Other

## 2015-01-29 ENCOUNTER — Other Ambulatory Visit (HOSPITAL_BASED_OUTPATIENT_CLINIC_OR_DEPARTMENT_OTHER): Payer: Medicare Other

## 2015-01-29 ENCOUNTER — Telehealth: Payer: Self-pay | Admitting: *Deleted

## 2015-01-29 ENCOUNTER — Telehealth: Payer: Self-pay | Admitting: Oncology

## 2015-01-29 ENCOUNTER — Ambulatory Visit (HOSPITAL_BASED_OUTPATIENT_CLINIC_OR_DEPARTMENT_OTHER): Payer: Medicare Other | Admitting: Oncology

## 2015-01-29 VITALS — BP 89/63 | HR 98 | Temp 98.2°F | Resp 18 | Ht 69.0 in | Wt 129.1 lb

## 2015-01-29 DIAGNOSIS — C9000 Multiple myeloma not having achieved remission: Secondary | ICD-10-CM

## 2015-01-29 DIAGNOSIS — B20 Human immunodeficiency virus [HIV] disease: Secondary | ICD-10-CM

## 2015-01-29 DIAGNOSIS — R079 Chest pain, unspecified: Secondary | ICD-10-CM

## 2015-01-29 DIAGNOSIS — C9002 Multiple myeloma in relapse: Secondary | ICD-10-CM

## 2015-01-29 DIAGNOSIS — Z95828 Presence of other vascular implants and grafts: Secondary | ICD-10-CM

## 2015-01-29 LAB — CBC WITH DIFFERENTIAL/PLATELET
BASO%: 1.1 % (ref 0.0–2.0)
BASOS ABS: 0 10*3/uL (ref 0.0–0.1)
EOS%: 1.5 % (ref 0.0–7.0)
Eosinophils Absolute: 0 10*3/uL (ref 0.0–0.5)
HCT: 34.1 % — ABNORMAL LOW (ref 38.4–49.9)
HEMOGLOBIN: 11 g/dL — AB (ref 13.0–17.1)
LYMPH#: 1.7 10*3/uL (ref 0.9–3.3)
LYMPH%: 54.4 % — ABNORMAL HIGH (ref 14.0–49.0)
MCH: 29 pg (ref 27.2–33.4)
MCHC: 32.2 g/dL (ref 32.0–36.0)
MCV: 90.3 fL (ref 79.3–98.0)
MONO#: 0.4 10*3/uL (ref 0.1–0.9)
MONO%: 12.5 % (ref 0.0–14.0)
NEUT#: 0.9 10*3/uL — ABNORMAL LOW (ref 1.5–6.5)
NEUT%: 30.5 % — AB (ref 39.0–75.0)
Platelets: 137 10*3/uL — ABNORMAL LOW (ref 140–400)
RBC: 3.77 10*6/uL — ABNORMAL LOW (ref 4.20–5.82)
RDW: 13.8 % (ref 11.0–14.6)
WBC: 3.1 10*3/uL — AB (ref 4.0–10.3)

## 2015-01-29 LAB — COMPREHENSIVE METABOLIC PANEL (CC13)
ALBUMIN: 3.5 g/dL (ref 3.5–5.0)
ALK PHOS: 55 U/L (ref 40–150)
ALT: 33 U/L (ref 0–55)
AST: 22 U/L (ref 5–34)
Anion Gap: 10 mEq/L (ref 3–11)
BILIRUBIN TOTAL: 0.75 mg/dL (ref 0.20–1.20)
BUN: 8.1 mg/dL (ref 7.0–26.0)
CO2: 24 meq/L (ref 22–29)
Calcium: 9 mg/dL (ref 8.4–10.4)
Chloride: 106 mEq/L (ref 98–109)
Creatinine: 0.7 mg/dL (ref 0.7–1.3)
EGFR: >90 mL/min/{1.73_m2} (ref >90)
Glucose: 91 mg/dl (ref 70–140)
Potassium: 3.8 mEq/L (ref 3.5–5.1)
Sodium: 140 mEq/L (ref 136–145)
Total Protein: 5.4 g/dL — ABNORMAL LOW (ref 6.4–8.3)

## 2015-01-29 MED ORDER — HEPARIN SOD (PORK) LOCK FLUSH 100 UNIT/ML IV SOLN
500.0000 [IU] | Freq: Once | INTRAVENOUS | Status: AC
  Administered 2015-01-29: 500 [IU] via INTRAVENOUS
  Filled 2015-01-29: qty 5

## 2015-01-29 MED ORDER — HYDROCODONE-ACETAMINOPHEN 10-325 MG PO TABS: 1.0000 | ORAL_TABLET | Freq: Four times a day (QID) | ORAL | Status: DC | PRN

## 2015-01-29 MED ORDER — SODIUM CHLORIDE 0.9 % IJ SOLN
10.0000 mL | INTRAMUSCULAR | Status: DC | PRN
  Administered 2015-01-29: 10 mL via INTRAVENOUS
  Filled 2015-01-29: qty 10

## 2015-01-29 NOTE — Telephone Encounter (Signed)
Gave avs & calendar for March. °

## 2015-01-29 NOTE — Progress Notes (Signed)
Hematology and Oncology Follow Up Visit  Lasean Rick Potter [id number] 12-24-1945 69 y.o. 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 He presented with lytic bone lesions and 84% plasma infiltration in his bone marrow. No heavy chain was detected. He also has HIV disease under control.  Prior Therapy: He was treated in the [facility] area initially with a Velcade-based regimen and had an excellent response with about 4% bone marrow residual disease. In April of 2014 he developed relapsed and was treated with Revlimid, Velcade and dexamethasone regimen. He had an excellent response with his kappa free light chain close to 60 mg/dL. He is planned to have maintenance Velcade treatment even his poor tolerance to Revlimid. He recently relocated to [place] to be close to family.   Current therapy: Velcade and dexamethasone salvage chemotherapy started on 12/24/2014.  Interim History:  Mr. Vannote presents today for a followup visit. Since his last visit, he continues to be on Velcade dexamethasone combination with few complications. He is doing better with the subcutaneous formulation of the Velcade rather intravenous. He continues to have generalized weakness but his weight is slightly improved. He does not report any peripheral neuropathy or any other complications. He continues to have post prandial pain. His pain is predominantly mid epigastric and exacerbated by food. He Is not reporting any headaches or blurry vision or syncope. Is not reporting any shortness of breath or cough or hemoptysis. Does not report any nausea or vomiting abdominal pain. Has not reported any hematochezia or melena. He reports no frequency urgency or hesitancy.  Rest of his review of systems unremarkable.  Medications: I have reviewed the patient's current medications.  Current Outpatient  Prescriptions  Medication Sig Dispense Refill  . clopidogrel (PLAVIX) 75 MG tablet Take 75 mg by mouth daily.    . darunavir-cobicistat (PREZCOBIX) 800-150 MG per tablet Take 1 tablet by mouth daily. Swallow whole. Do NOT crush, break or chew tablets. Take with food. 30 tablet 11  . dexamethasone (DECADRON) 4 MG tablet Take 5 tablets weekly with chemotherapy. 40 tablet 3  . emtricitabine-tenofovir (TRUVADA) 200-300 MG per tablet Take 1 tablet by mouth daily. 30 tablet 11  . HYDROcodone-acetaminophen (NORCO) 10-325 MG per tablet Take 1 tablet by mouth every 6 (six) hours as needed. 30 tablet 0  . prochlorperazine (COMPAZINE) 10 MG tablet Take 1 tablet (10 mg total) by mouth every 6 (six) hours as needed for nausea or vomiting. 30 tablet 0   No current facility-administered medications for this visit.     Allergies: No Known Allergies  Past Medical History, Surgical history, Social history, and Family History were reviewed and updated.   Physical Exam: Blood pressure 89/63, pulse 98, temperature 98.2 F (36.8 C), temperature source Oral, resp. rate 18, height 5' 9" (1.753 m), weight 129 lb 1.6 oz (58.559 kg). ECOG: 1 General appearance: alert and cooperative did not appear any distress. Head: Normocephalic, without obvious abnormality Neck: no adenopathy Lymph nodes: Cervical, supraclavicular, and axillary nodes normal. Heart:regular rate and rhythm, S1, S2 normal, no murmur, click, rub or gallop  Chest wall examination: Could not reproduce chest wall pain. Lung:chest clear, no wheezing, rales, normal symmetric air entry Abdomin: soft, non-tender, without masses or organomegaly EXT:no erythema, induration, or nodules   Lab Results: Lab Results  Component Value Date   WBC 3.1* 01/29/2015   HGB 11.0* 01/29/2015   HCT 34.1* 01/29/2015  MCV 90.3 01/29/2015   PLT 137* 01/29/2015     Chemistry      Component Value Date/Time   NA 141 01/21/2015 1124   NA 140 12/19/2014 1633   K  3.6 01/21/2015 1124   K 4.4 12/19/2014 1633   CL 104 12/19/2014 1633   CO2 24 01/21/2015 1124   CO2 30 12/19/2014 1633   BUN 7.9 01/21/2015 1124   BUN <5* 12/19/2014 1633   CREATININE 0.9 01/21/2015 1124   CREATININE 0.87 12/19/2014 1633   CREATININE 0.91 12/09/2014 1100      Component Value Date/Time   CALCIUM 9.4 01/21/2015 1124   CALCIUM 9.8 12/19/2014 1633   ALKPHOS 57 01/21/2015 1124   ALKPHOS 59 12/09/2014 1100   AST 32 01/21/2015 1124   AST 30 12/09/2014 1100   ALT 41 01/21/2015 1124   ALT 23 12/09/2014 1100   BILITOT 0.81 01/21/2015 1124   BILITOT 3.9* 12/09/2014 1100       Results for AROLDO, GALLI (MRN [id number]) as of 01/29/2015 14:37  Ref. Range 11/13/2014 10:20 12/24/2014 12:11 01/21/2015 11:24  Kappa free light chain Latest Range: 0.33-1.94 mg/dL 386.00 (H) 386.00 (H) 270.00 (H)        Impression and Plan:  69 year old gentleman with the following issues:   1. 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 He is status post of Velcade-based regimen and subsequently received Velcade with Revlimid and tolerated it poorly.   He is currently on Velcade and dexamethasone and have tolerated the subcutaneous formulation better. His light chains have actually showed some decline over the last month. The plan is to continue with weekly treatment of subcutaneous Velcade and dexamethasone in an attempt to bring his light chains under control.  2. HIV disease: Under reasonable control followed by infectious disease.  3. Port-A-Cath management: it will be flushed with every 6 weeks if not used for chemotherapy.  4. Chest pain: He was referred to gastroenterology for a GI workup and he is a procedure scheduled in the near future.  5. Bone directed therapy: He will be candidate to start Zometa but I would like to assess for any complications from Velcade before starting that.  6. Follow-up: In 4 weeks to assess his tolerance to this chemotherapy  regimen.  Zola Button, MD 2/24/20162:55 PM

## 2015-01-29 NOTE — Telephone Encounter (Signed)
I have called and left message to call the office back regarding his appt tomorrow. I need to move out of injection room into infusion room at 12:45pm in j32.    I haven't moved appt yet, waiting on patient.

## 2015-01-30 ENCOUNTER — Ambulatory Visit (HOSPITAL_BASED_OUTPATIENT_CLINIC_OR_DEPARTMENT_OTHER): Payer: Medicare Other

## 2015-01-30 DIAGNOSIS — Z5112 Encounter for antineoplastic immunotherapy: Secondary | ICD-10-CM

## 2015-01-30 DIAGNOSIS — C9002 Multiple myeloma in relapse: Secondary | ICD-10-CM

## 2015-01-30 LAB — KAPPA/LAMBDA LIGHT CHAINS
Kappa free light chain: 124 mg/dL — ABNORMAL HIGH (ref 0.33–1.94)
Kappa:Lambda Ratio: 688.89 — ABNORMAL HIGH (ref 0.26–1.65)
Lambda Free Lght Chn: 0.18 mg/dL — ABNORMAL LOW (ref 0.57–2.63)

## 2015-01-30 MED ORDER — ONDANSETRON HCL 8 MG PO TABS
ORAL_TABLET | ORAL | Status: AC
Start: 2015-01-30 — End: 2015-01-30
  Filled 2015-01-30: qty 1

## 2015-01-30 MED ORDER — ONDANSETRON HCL 8 MG PO TABS
8.0000 mg | ORAL_TABLET | Freq: Once | ORAL | Status: AC
  Administered 2015-01-30: 8 mg via ORAL

## 2015-01-30 MED ORDER — BORTEZOMIB CHEMO SQ INJECTION 3.5 MG (2.5MG/ML)
1.3000 mg/m2 | Freq: Once | INTRAMUSCULAR | Status: AC
  Administered 2015-01-30: 2.25 mg via SUBCUTANEOUS
  Filled 2015-01-30: qty 2.25

## 2015-01-30 NOTE — Patient Instructions (Signed)
[facility] Discharge Instructions for Patients Receiving Chemotherapy  Today you received the following chemotherapy agents:  Velcade  To help prevent nausea and vomiting after your treatment, we encourage you to take your nausea medication as ordered per MD.   If you develop nausea and vomiting that is not controlled by your nausea medication, call the clinic.   BELOW ARE SYMPTOMS THAT SHOULD BE REPORTED IMMEDIATELY:  *FEVER GREATER THAN 100.5 F  *CHILLS WITH OR WITHOUT FEVER  NAUSEA AND VOMITING THAT IS NOT CONTROLLED WITH YOUR NAUSEA MEDICATION  *UNUSUAL SHORTNESS OF BREATH  *UNUSUAL BRUISING OR BLEEDING  TENDERNESS IN MOUTH AND THROAT WITH OR WITHOUT PRESENCE OF ULCERS  *URINARY PROBLEMS  *BOWEL PROBLEMS  UNUSUAL RASH Items with * indicate a potential emergency and should be followed up as soon as possible.  Feel free to call the clinic you have any questions or concerns. The clinic phone number is [phone].    

## 2015-01-30 NOTE — Progress Notes (Signed)
OK to treat with ANC-0.9 from CBC on 01/29/15 per Dr. Alen Blew.

## 2015-02-03 ENCOUNTER — Encounter (HOSPITAL_COMMUNITY): Payer: Self-pay | Admitting: Emergency Medicine

## 2015-02-03 ENCOUNTER — Observation Stay (HOSPITAL_COMMUNITY)
Admission: EM | Admit: 2015-02-03 | Discharge: 2015-02-09 | Disposition: A | Discharge status: Home / Self Care | Payer: Medicare Other | Attending: Internal Medicine | Admitting: Internal Medicine

## 2015-02-03 ENCOUNTER — Emergency Department (HOSPITAL_COMMUNITY): Payer: Medicare Other

## 2015-02-03 DIAGNOSIS — I25119 Atherosclerotic heart disease of native coronary artery with unspecified angina pectoris: Secondary | ICD-10-CM | POA: Diagnosis not present

## 2015-02-03 DIAGNOSIS — R0789 Other chest pain: Secondary | ICD-10-CM | POA: Diagnosis not present

## 2015-02-03 DIAGNOSIS — Z681 Body mass index (BMI) 19 or less, adult: Secondary | ICD-10-CM | POA: Diagnosis not present

## 2015-02-03 DIAGNOSIS — R079 Chest pain, unspecified: Secondary | ICD-10-CM | POA: Diagnosis present

## 2015-02-03 DIAGNOSIS — C9000 Multiple myeloma not having achieved remission: Secondary | ICD-10-CM

## 2015-02-03 DIAGNOSIS — E119 Type 2 diabetes mellitus without complications: Secondary | ICD-10-CM | POA: Diagnosis not present

## 2015-02-03 DIAGNOSIS — R778 Other specified abnormalities of plasma proteins: Secondary | ICD-10-CM | POA: Insufficient documentation

## 2015-02-03 DIAGNOSIS — E785 Hyperlipidemia, unspecified: Secondary | ICD-10-CM | POA: Insufficient documentation

## 2015-02-03 DIAGNOSIS — R109 Unspecified abdominal pain: Secondary | ICD-10-CM | POA: Diagnosis not present

## 2015-02-03 DIAGNOSIS — E46 Unspecified protein-calorie malnutrition: Secondary | ICD-10-CM | POA: Diagnosis not present

## 2015-02-03 DIAGNOSIS — I214 Non-ST elevation (NSTEMI) myocardial infarction: Secondary | ICD-10-CM | POA: Diagnosis present

## 2015-02-03 DIAGNOSIS — R072 Precordial pain: Secondary | ICD-10-CM

## 2015-02-03 DIAGNOSIS — E43 Unspecified severe protein-calorie malnutrition: Secondary | ICD-10-CM | POA: Insufficient documentation

## 2015-02-03 DIAGNOSIS — B20 Human immunodeficiency virus [HIV] disease: Secondary | ICD-10-CM | POA: Diagnosis present

## 2015-02-03 DIAGNOSIS — I209 Angina pectoris, unspecified: Secondary | ICD-10-CM | POA: Insufficient documentation

## 2015-02-03 DIAGNOSIS — Z7902 Long term (current) use of antithrombotics/antiplatelets: Secondary | ICD-10-CM | POA: Diagnosis not present

## 2015-02-03 DIAGNOSIS — R0602 Shortness of breath: Secondary | ICD-10-CM

## 2015-02-03 DIAGNOSIS — Z21 Asymptomatic human immunodeficiency virus [HIV] infection status: Secondary | ICD-10-CM | POA: Diagnosis not present

## 2015-02-03 DIAGNOSIS — R7989 Other specified abnormal findings of blood chemistry: Secondary | ICD-10-CM

## 2015-02-03 DIAGNOSIS — Z950 Presence of cardiac pacemaker: Secondary | ICD-10-CM | POA: Insufficient documentation

## 2015-02-03 DIAGNOSIS — C9002 Multiple myeloma in relapse: Secondary | ICD-10-CM | POA: Insufficient documentation

## 2015-02-03 DIAGNOSIS — Z8673 Personal history of transient ischemic attack (TIA), and cerebral infarction without residual deficits: Secondary | ICD-10-CM | POA: Diagnosis not present

## 2015-02-03 HISTORY — DX: Prediabetes: R73.03

## 2015-02-03 HISTORY — DX: Presence of cardiac pacemaker: Z95.0

## 2015-02-03 HISTORY — DX: Transient cerebral ischemic attack, unspecified: G45.9

## 2015-02-03 LAB — CBC WITH DIFFERENTIAL/PLATELET
Basophils Absolute: 0 10*3/uL (ref 0.0–0.1)
Basophils Relative: 0 % (ref 0–1)
EOS PCT: 1 % (ref 0–5)
Eosinophils Absolute: 0 10*3/uL (ref 0.0–0.7)
HCT: 32.4 % — ABNORMAL LOW (ref 39.0–52.0)
Hemoglobin: 10.9 g/dL — ABNORMAL LOW (ref 13.0–17.0)
LYMPHS ABS: 1.6 10*3/uL (ref 0.7–4.0)
Lymphocytes Relative: 39 % (ref 12–46)
MCH: 29.5 pg (ref 26.0–34.0)
MCHC: 33.6 g/dL (ref 30.0–36.0)
MCV: 87.6 fL (ref 78.0–100.0)
Monocytes Absolute: 0.3 10*3/uL (ref 0.1–1.0)
Monocytes Relative: 8 % (ref 3–12)
Neutro Abs: 2.1 10*3/uL (ref 1.7–7.7)
Neutrophils Relative %: 52 % (ref 43–77)
PLATELETS: 143 10*3/uL — AB (ref 150–400)
RBC: 3.7 MIL/uL — ABNORMAL LOW (ref 4.22–5.81)
RDW: 12.8 % (ref 11.5–15.5)
WBC: 4.1 10*3/uL (ref 4.0–10.5)

## 2015-02-03 LAB — COMPREHENSIVE METABOLIC PANEL
ALBUMIN: 3.5 g/dL (ref 3.5–5.2)
ALK PHOS: 67 U/L (ref 39–117)
ALT: 54 U/L — AB (ref 0–53)
AST: 62 U/L — ABNORMAL HIGH (ref 0–37)
Anion gap: 6 (ref 5–15)
BUN: 8 mg/dL (ref 6–23)
CO2: 26 mmol/L (ref 19–32)
Calcium: 9.2 mg/dL (ref 8.4–10.5)
Chloride: 105 mmol/L (ref 96–112)
Creatinine, Ser: 0.87 mg/dL (ref 0.50–1.35)
GFR calc non Af Amer: 88 mL/min — ABNORMAL LOW (ref >90)
GLUCOSE: 110 mg/dL — AB (ref 70–99)
POTASSIUM: 3.6 mmol/L (ref 3.5–5.1)
SODIUM: 137 mmol/L (ref 135–145)
Total Bilirubin: 0.8 mg/dL (ref 0.3–1.2)
Total Protein: 5.4 g/dL — ABNORMAL LOW (ref 6.0–8.3)

## 2015-02-03 LAB — I-STAT TROPONIN, ED: Troponin i, poc: 0.25 ng/mL (ref 0.00–0.08)

## 2015-02-03 LAB — PROTIME-INR
INR: 1.28 (ref 0.00–1.49)
Prothrombin Time: 16.1 seconds — ABNORMAL HIGH (ref 11.6–15.2)

## 2015-02-03 LAB — BRAIN NATRIURETIC PEPTIDE: B Natriuretic Peptide: 621.5 pg/mL — ABNORMAL HIGH (ref 0.0–100.0)

## 2015-02-03 LAB — TROPONIN I: TROPONIN I: 0.32 ng/mL — AB (ref <0.031)

## 2015-02-03 LAB — LIPASE, BLOOD: LIPASE: 42 U/L (ref 11–59)

## 2015-02-03 MED ORDER — CLOPIDOGREL BISULFATE 75 MG PO TABS
75.0000 mg | ORAL_TABLET | Freq: Every day | ORAL | Status: DC
  Administered 2015-02-03 – 2015-02-09 (×7): 75 mg via ORAL
  Filled 2015-02-03 (×8): qty 1

## 2015-02-03 MED ORDER — ACETAMINOPHEN 325 MG PO TABS: 650.0000 mg | ORAL_TABLET | ORAL | Status: DC | PRN

## 2015-02-03 MED ORDER — GI COCKTAIL ~~LOC~~
30.0000 mL | Freq: Four times a day (QID) | ORAL | Status: DC | PRN
  Administered 2015-02-05: 30 mL via ORAL
  Filled 2015-02-03 (×3): qty 30

## 2015-02-03 MED ORDER — MORPHINE SULFATE 2 MG/ML IJ SOLN: 2.0000 mg | INTRAMUSCULAR | Status: DC | PRN

## 2015-02-03 MED ORDER — HYDROCODONE-ACETAMINOPHEN 10-325 MG PO TABS
1.0000 | ORAL_TABLET | Freq: Four times a day (QID) | ORAL | Status: DC | PRN
Start: 2015-02-03 — End: 2015-02-09
  Administered 2015-02-04 – 2015-02-08 (×7): 1 via ORAL
  Filled 2015-02-03 (×9): qty 1

## 2015-02-03 MED ORDER — ONDANSETRON HCL 4 MG/2ML IJ SOLN: 4.0000 mg | Freq: Four times a day (QID) | INTRAMUSCULAR | Status: DC | PRN

## 2015-02-03 MED ORDER — GI COCKTAIL ~~LOC~~
30.0000 mL | Freq: Once | ORAL | Status: AC
  Administered 2015-02-03: 30 mL via ORAL
  Filled 2015-02-03: qty 30

## 2015-02-03 MED ORDER — HEPARIN (PORCINE) IN NACL 100-0.45 UNIT/ML-% IJ SOLN
700.0000 [IU]/h | INTRAMUSCULAR | Status: DC
  Administered 2015-02-03: 700 [IU]/h via INTRAVENOUS
  Filled 2015-02-03: qty 250

## 2015-02-03 MED ORDER — ASPIRIN 81 MG PO CHEW
81.0000 mg | CHEWABLE_TABLET | Freq: Every day | ORAL | Status: DC
  Administered 2015-02-04 – 2015-02-09 (×6): 81 mg via ORAL
  Filled 2015-02-03 (×7): qty 1

## 2015-02-03 MED ORDER — ISOSORBIDE MONONITRATE ER 30 MG PO TB24
30.0000 mg | ORAL_TABLET | Freq: Every day | ORAL | Status: DC
  Administered 2015-02-03: 30 mg via ORAL
  Filled 2015-02-03 (×3): qty 1

## 2015-02-03 MED ORDER — ASPIRIN 81 MG PO CHEW: 81.0000 mg | CHEWABLE_TABLET | Freq: Every day | ORAL | Status: DC

## 2015-02-03 MED ORDER — IOHEXOL 350 MG/ML SOLN
80.0000 mL | Freq: Once | INTRAVENOUS | Status: AC | PRN
  Administered 2015-02-03: 80 mL via INTRAVENOUS

## 2015-02-03 MED ORDER — NITROGLYCERIN 0.4 MG SL SUBL
0.4000 mg | SUBLINGUAL_TABLET | SUBLINGUAL | Status: DC | PRN
  Administered 2015-02-03: 0.4 mg via SUBLINGUAL
  Filled 2015-02-03: qty 1

## 2015-02-03 MED ORDER — HEPARIN BOLUS VIA INFUSION
4000.0000 [IU] | Freq: Once | INTRAVENOUS | Status: AC
  Administered 2015-02-03: 4000 [IU] via INTRAVENOUS
  Filled 2015-02-03: qty 4000

## 2015-02-03 MED ORDER — MORPHINE SULFATE 2 MG/ML IJ SOLN
2.0000 mg | Freq: Once | INTRAMUSCULAR | Status: AC
  Administered 2015-02-03: 2 mg via INTRAVENOUS
  Filled 2015-02-03: qty 1

## 2015-02-03 MED ORDER — ENOXAPARIN SODIUM 40 MG/0.4ML ~~LOC~~ SOLN
40.0000 mg | SUBCUTANEOUS | Status: DC
  Administered 2015-02-03: 40 mg via SUBCUTANEOUS
  Filled 2015-02-03: qty 0.4

## 2015-02-03 NOTE — ED Provider Notes (Signed)
CSN: [id number]     Arrival date & time 02/03/15  1615 History   First MD Initiated Contact with Patient 02/03/15 1624     Chief Complaint  Patient presents with  . Chest Pain     (Consider location/radiation/quality/duration/timing/severity/associated sxs/prior Treatment) HPI Comments: Rick Potter is a 69 y.o. male with a PMHx of multiple myeloma on chemo, HIV, HLD, and DM2, who presents to the ED with complaints of intermittent daily chest pain 1.5 months which became more severe today gradually while he was walking. He reports that approximately 10 minutes after eating breakfast he did experience some chest pain that resolved but when he was walking to see Dr. Paulita Fujita he became more short of breath and fatigued and the chest pain increased to a 10/10 aching throbbing pain located centrally and radiating to his upper back, more constant, worsened with exertion, and improved with aspirin and one nitroglycerin given prior to arrival. He reports that he was initially going to see Dr. Paulita Fujita because his primary care doctor thought that his chest pain was GI related. He endorses associated nausea and bilateral ankle swelling which comes and goes, but is present today. He denies any fevers, chills, cough, hemoptysis, wheezing, recent travel/surgery/immobilization, history of DVT/PE, abdominal pain, vomiting, diarrhea, constipation, heartburn, dysphagia, melena, hematochezia, dysuria, hematuria, numbness, tingling, weakness, lightheadedness, dizziness, diaphoresis, or pallor. Compliant on meds, although missed a few days of HIV meds last week. Reports he has a pacemaker he believes is [employer] brand but isn't sure. +FHx of cardiac disease in father who died at 69y/o of MI.  Patient is a 69 y.o. male presenting with chest pain. The history is provided by the patient. No language interpreter was used.  Chest Pain Pain location:  Substernal area Pain quality: aching and throbbing   Pain radiates to:  Upper  back Pain radiates to the back: yes   Pain severity:  Severe Onset quality:  Gradual Duration:  2 months (but worsened today) Timing:  Constant Progression:  Worsening Chronicity:  Recurrent Context: eating   Context comment:  Began after eating, then worsened after walking Relieved by:  Aspirin and nitroglycerin Worsened by:  Exertion Ineffective treatments:  None tried Associated symptoms: lower extremity edema (ankles), nausea and shortness of breath   Associated symptoms: no abdominal pain, no altered mental status, no back pain, no claudication, no cough, no diaphoresis, no dizziness, no dysphagia, no fever, no heartburn, no near-syncope, no numbness, no orthopnea, no PND, no syncope, not vomiting and no weakness   Risk factors: high cholesterol and male sex   Risk factors: no prior DVT/PE, no smoking and no surgery   Risk factors comment:  +chemotherapy   Past Medical History  Diagnosis Date  . Multiple myeloma 2011  . HIV infection   . Hyperlipidemia   . Diabetes mellitus without complication   . Multiple myeloma    Past Surgical History  Procedure Laterality Date  . Neck surgery    . Psychologist, forensic    . Hernia repair     Family History  Problem Relation Age of Onset  . Stroke Mother   . Cancer Sister   . Diabetes Brother   . Alcohol abuse Brother    History  Substance Use Topics  . Smoking status: Former Smoker    Types: Cigarettes    Quit date: 12/06/1997  . Smokeless tobacco: Not on file  . Alcohol Use: No    Review of Systems  Constitutional: Negative for fever, chills and diaphoresis.  HENT: Negative for trouble swallowing.   Respiratory: Positive for shortness of breath. Negative for cough and wheezing.   Cardiovascular: Positive for chest pain and leg swelling (ankles bilaterally). Negative for orthopnea, claudication, syncope, PND and near-syncope.  Gastrointestinal: Positive for nausea. Negative for heartburn, vomiting, abdominal pain, diarrhea,  constipation and blood in stool.  Genitourinary: Negative for dysuria and hematuria.  Musculoskeletal: Negative for myalgias, back pain, arthralgias and neck pain.  Skin: Negative for color change.  Allergic/Immunologic: Positive for immunocompromised state (on chemo).  Neurological: Negative for dizziness, syncope, weakness, light-headedness and numbness.  Psychiatric/Behavioral: Negative for confusion.   10 Systems reviewed and are negative for acute change except as noted in the HPI.    Allergies  Review of patient's allergies indicates no known allergies.  Home Medications   Prior to Admission medications   Medication Sig Start Date End Date Taking? Authorizing Provider  clopidogrel (PLAVIX) 75 MG tablet Take 75 mg by mouth daily.    Historical Provider, MD  darunavir-cobicistat (PREZCOBIX) 800-150 MG per tablet Take 1 tablet by mouth daily. Swallow whole. Do NOT crush, break or chew tablets. Take with food. 12/09/14   Carlyle Basques, MD  dexamethasone (DECADRON) 4 MG tablet Take 5 tablets weekly with chemotherapy. 12/24/14   Wyatt Portela, MD  emtricitabine-tenofovir (TRUVADA) 200-300 MG per tablet Take 1 tablet by mouth daily. 09/02/14   Carlyle Basques, MD  HYDROcodone-acetaminophen (NORCO) 10-325 MG per tablet Take 1 tablet by mouth every 6 (six) hours as needed. 01/29/15   Wyatt Portela, MD  prochlorperazine (COMPAZINE) 10 MG tablet Take 1 tablet (10 mg total) by mouth every 6 (six) hours as needed for nausea or vomiting. 12/31/14   Wyatt Portela, MD   BP 108/79 mmHg  Pulse 97  Temp(Src) 97.8 F (36.6 C)  Resp 18  Ht 5' 9"  (1.753 m)  Wt 128 lb (58.06 kg)  BMI 18.89 kg/m2  SpO2 96% Physical Exam  Constitutional: He is oriented to person, place, and time. He appears well-developed and well-nourished.  Non-toxic appearance. No distress.  Afebrile, nontoxic, NAD, slightly tachycardic at 100-106 during exam. SpO2 96% on RA, thin and frail gentleman. BP 99/56 during exam.   HENT:   Head: Normocephalic and atraumatic.  Mouth/Throat: Oropharynx is clear and moist and mucous membranes are normal.  Eyes: Conjunctivae and EOM are normal. Right eye exhibits no discharge. Left eye exhibits no discharge.  Neck: Normal range of motion. Neck supple.  Cardiovascular: Normal rate, regular rhythm, normal heart sounds and intact distal pulses.  Exam reveals no gallop and no friction rub.   No murmur heard. Regular rhythm, slightly tachycardic during exam HR 100-106, nl s1/s2, no m/r/g, distal pulses intact, trace ankle edema bilaterally, neg homan's bilaterally  Pulmonary/Chest: Effort normal and breath sounds normal. No respiratory distress. He has no decreased breath sounds. He has no wheezes. He has no rhonchi. He has no rales. He exhibits no tenderness, no crepitus and no deformity.  CTAB in all lung fields, no w/r/r, no hypoxia or increased WOB, speaking in full sentences, SpO2 99% on RA  No chest wall deformity, crepitus, or TTP  Abdominal: Soft. Normal appearance and bowel sounds are normal. He exhibits no distension. There is no tenderness. There is no rigidity, no rebound, no guarding, no tenderness at McBurney's point and negative Murphy's sign.  Musculoskeletal: Normal range of motion.  MAE x4 Strength 5/5 in all extremities Sensation grossly intact in all extremities Distal pulses intact bilaterally Slight edema at  b/l ankles, symmetric Neg homan's sign bilaterally  Neurological: He is alert and oriented to person, place, and time. He has normal strength. No sensory deficit.  Skin: Skin is warm, dry and intact. No rash noted.  Psychiatric: He has a normal mood and affect.  Nursing note and vitals reviewed.   ED Course  Procedures (including critical care time) Labs Review Labs Reviewed  BRAIN NATRIURETIC PEPTIDE - Abnormal; Notable for the following:    B Natriuretic Peptide 621.5 (*)    All other components within normal limits  CBC WITH DIFFERENTIAL/PLATELET -  Abnormal; Notable for the following:    RBC 3.70 (*)    Hemoglobin 10.9 (*)    HCT 32.4 (*)    Platelets 143 (*)    All other components within normal limits  COMPREHENSIVE METABOLIC PANEL - Abnormal; Notable for the following:    Glucose, Bld 110 (*)    Total Protein 5.4 (*)    AST 62 (*)    ALT 54 (*)    GFR calc non Af Amer 88 (*)    All other components within normal limits  I-STAT TROPOININ, ED - Abnormal; Notable for the following:    Troponin i, poc 0.25 (*)    All other components within normal limits  LIPASE, BLOOD  PROTIME-INR  HEPARIN LEVEL (UNFRACTIONATED)  CBC  HEPARIN LEVEL (UNFRACTIONATED)  TROPONIN I  TROPONIN I  TROPONIN I    Imaging Review Dg Chest 2 View  02/03/2015   CLINICAL DATA:  Chronic chest pain for 1 month, with fatigue. Initial encounter.  EXAM: CHEST  2 VIEW  COMPARISON:  Chest radiograph and CTA of the chest performed 12/19/2014  FINDINGS: The lungs are well-aerated and clear. There is no evidence of focal opacification, pleural effusion or pneumothorax. A right-sided chest port is noted ending about the distal SVC.  The heart is normal in size; a pacemaker is noted at the left chest wall, with leads ending at the right atrium and right ventricle. No acute osseous abnormalities are seen. Cervical spinal fusion hardware is partially imaged.  IMPRESSION: No acute cardiopulmonary process seen.   Electronically Signed   By: Garald Balding M.D.   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 male with ongoing CP x1.5 months that got  worse with exertion today while going to see Dr. Paulita Fujita. On chemo for multiple myeloma, has some LE swelling in ankles, and is slightly tachycardic during exam (low 100s), therefore his Well's score is high, will proceed with CTA vs obtaining dimer (which is likely to be elevated given his chronic medical issues). Could still be GI related, since his CP initially started after eating this morning, but had subsided. Has SOB and fatigue as well. Will get basic labs, cardiac work up, BNP, and CXR/CTA. Would potentially require ACS rule out overnight but will reassess shortly. Given NTG already and has borderline low BPs therefore will hold more NTG and Morphine. Will give GI cocktail. Will attempt to interrogate  pacemaker if possible. Will reassess shortly.   5:40 PM RN notifying me of elevated trop. EKG without STEMI, therefore this would be NSTEMI. Heparin ordered, cardiology consulted.   5:49 PM Cecilie Kicks returning page for cardiology. Will see pt ASAP. Pt update on situation, all questions answered. Stable at this time. Will continue to monitor.  6:57 PM Dr. Daneen Schick of cardiology here to see pt. Placed recommendation orders, but would like medical admission and will trend enzymes as well as get lexiscan in the AM. Will consult for [facility] admission.   7:12 PM Dr. Lily Kocher from [facility] returning page, will admit to tele. Please see her dictation for further documentation of care. Of note, BNP elevated at 621.5, CBC w/diff showing anemia and thrombocytopenia which is chronic. CMP showing mildly elevated AST/ALT. Lipase WNL. CXR clear for acute changes. 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 Stable at this time.  Patty Sermons [place], [place] 02/03/15 [place], MD 02/03/15 [phone]

## 2015-02-03 NOTE — H&P (Signed)
[facility] History and Physical  [name] ZYY:[id number] DOB: [birth year] DOA: 02/03/2015  PCP: [name], MD  Specialists: Dr. Alen Blew, heme/onc Dr. Karolee Ohs, [place], [phone]  Chief Complaint: Chest pain  HPI: Rick Potter is a 69 y.o. gentleman with a PMHx significant for MM (currently on Velcade and decadron, administered once weekly on Thursdays) and HIV (well-controlled, last viral load in January undetectable) who presents for evaluation of recurrent exertional chest pain that has worsened in intensity over the past several days.  The patient has had chest pain episode for the past few weeks.  He says that he initially noticed the pain after eating, but a trial of daily antacids for approximately one month never helped his symptoms.  He now reports a substernal pressure that radiates to his back; it has been exertional but has also happened at rest and has awakened him from sleep.  Pain lasts 5-7 minutes at a time.  It has been associated with SOB but no diaphoresis, no light-headedness, no syncope.  He has had increased fatigue.  He has NOT taken his HIV medications for the past several days, due to feeling ill.  His ID physician is not aware.  ED evaluation concerning for a troponin of 0.25 upon presentation and cardiology has already been consulted.  CTA chest also ordered.  The patient is being admitted to the hospitalist service, with co-management from cardiology.  Review of Systems: 12 systems reviewed and negative except as stated in the HPI.  Past Medical History  Diagnosis Date  . Multiple myeloma 2011  . HIV infection     Viral load undetectable in January with CD4 count of 340  . Hyperlipidemia     Patient denies.  . Diabetes mellitus without complication     "Borderline" under surveillance, no medical therapy  Hx of TIA  Past Surgical History  Procedure Laterality Date  . Neck surgery      Cervical laminectomy  . Psychologist, forensic    . Hernia  repair      Bilateral inguinal and umbilical  He has a Port-a-Cath  Social History:  History   Social History Narrative   He is not married.  He does not have any children.  Has been in [place] for about 10 months.  Relocated from [place].  Remote tobacco use.  No EtOH or illicit drug use.  No Known Allergies  Family History  Problem Relation Age of Onset  . Stroke Mother   . Cancer Sister   . Diabetes Brother   . Alcohol abuse Brother   Both parents are deceased.  Sister has MM as well.   Prior to Admission medications   Medication Sig Start Date End Date Taking? Authorizing Provider  clopidogrel (PLAVIX) 75 MG tablet Take 75 mg by mouth daily.   Yes Historical Provider, MD  darunavir-cobicistat (PREZCOBIX) 800-150 MG per tablet Take 1 tablet by mouth daily. Swallow whole. Do NOT crush, break or chew tablets. Take with food. 12/09/14  Yes Carlyle Basques, MD  dexamethasone (DECADRON) 4 MG tablet Take 5 tablets weekly with chemotherapy. 12/24/14  Yes Wyatt Portela, MD  emtricitabine-tenofovir (TRUVADA) 200-300 MG per tablet Take 1 tablet by mouth daily. 09/02/14  Yes Carlyle Basques, MD  HYDROcodone-acetaminophen [facility]) 10-325 MG per tablet Take 1 tablet by mouth every 6 (six) hours as needed. 01/29/15  Yes Wyatt Portela, MD  prochlorperazine (COMPAZINE) 10 MG tablet Take 1 tablet (10 mg total) by mouth every 6 (six) hours as needed for nausea  or vomiting. 12/31/14  Yes Wyatt Portela, MD   Physical Exam: Filed Vitals:   02/03/15 1700 02/03/15 1715 02/03/15 1730 02/03/15 1945  BP: 97/70 100/70 98/67 116/82  Pulse: 99 89 88 112  Temp:      Resp: _0 Height:      Weight:      SpO2: 100% 100% 100% 98%  T 97.8   General:  Awake and alert.  Chronically ill appearing.  Thin.  Oriented to person, place, time and situation.  Active chest pain after returning from CT.  He becomes emotional during the interview but cooperative and calm after receiving IV morphine.  Eyes: PERRL  bilaterally, conjunctiva are pink.  EOMI.  ENT: Moist mucous membranes.  Clear nasal drainage (denies seasonal allergies).  Neck: Thin.  No carotid bruit.  No apparent masses.     Cardiovascular: Tachycardic but regular.  I do not hear murmur or rub.  Doughy edema at ankles.  Respiratory: CTA bilaterally.  Abdomen: Soft/NT/ND.  Bowel sounds are present.  No guarding.  Skin: Warm and dry.  Well healed scar to posterior neck.  Musculoskeletal: Moves all four extremities spontaneously.  Psychiatric: Normal affect.  Neurologic: No focal deficits.   Labs on Admission:  Basic Metabolic Panel:  Recent Labs Lab 01/29/15 1401 02/03/15 1707  NA 140 137  K 3.8 3.6  CL  --  105  CO2 24 26  GLUCOSE 91 110*  BUN 8.1 8  CREATININE 0.7 0.87  CALCIUM 9.0 9.2   Liver Function Tests:  Recent Labs Lab 01/29/15 1401 02/03/15 1707  AST 22 62*  ALT 33 54*  ALKPHOS 55 67  BILITOT 0.75 0.8  PROT 5.4* 5.4*  ALBUMIN 3.5 3.5    Recent Labs Lab 02/03/15 1707  LIPASE 42   CBC:  Recent Labs Lab 01/29/15 1401 02/03/15 1707  WBC 3.1* 4.1  NEUTROABS 0.9* 2.1  HGB 11.0* 10.9*  HCT 34.1* 32.4*  MCV 90.3 87.6  PLT 137* 143*   BNP (last 3 results)  Recent Labs  12/19/14 1633 02/03/15 1707  BNP 486.7* 621.5*   Radiological Exams on Admission: Dg Chest 2 View  02/03/2015   CLINICAL DATA:  Chronic chest pain for 1 month, with fatigue. Initial encounter.  EXAM: CHEST  2 VIEW  COMPARISON:  Chest radiograph and CTA of the chest performed 12/19/2014  FINDINGS: The lungs are well-aerated and clear. There is no evidence of focal opacification, pleural effusion or pneumothorax. A right-sided chest port is noted ending about the distal SVC.  The heart is normal in size; a pacemaker is noted at the left chest wall, with leads ending at the right atrium and right ventricle. No acute osseous abnormalities are seen. Cervical spinal fusion hardware is partially imaged.  IMPRESSION: No acute  cardiopulmonary process seen.   Electronically Signed   By: Garald Balding M.D.   On: 02/03/2015 18:22   Ct Angio Chest Pe W/cm &/or Wo Cm  02/03/2015   CLINICAL DATA:  Short of breath, back pain. Concern for pulmonary embolism. History multiple myeloma  EXAM: CT ANGIOGRAPHY CHEST WITH CONTRAST  TECHNIQUE: Multidetector CT imaging of the chest was performed using the standard protocol during bolus administration of intravenous contrast. Multiplanar CT image reconstructions and MIPs were obtained to evaluate the vascular anatomy.  CONTRAST:  42m OMNIPAQUE IOHEXOL 350 MG/ML SOLN  COMPARISON:  CT 12/19/2014  FINDINGS: Mediastinum/Nodes: No filling defects within the pulmonary arteries to suggest acute pulmonary embolism. No  acute findings aorta great vessels. No pericardial fluid. Esophagus is normal.  Lungs/Pleura:  Review of the lung parenchyma demonstrates no pulmonary edema, pleural fluid, or pneumothorax. 4 mm nodule in the right upper lobe on image 41, series 11. Not changed from comparison exam.  Upper abdomen: Consistent with given history of multiple myeloma. Normal adrenal glands.  Musculoskeletal: Are multiple lytic and sclerotic lesions throughout the sternum and spine. Seventy lesions are expansile.  Review of the MIP images confirms the above findings.  IMPRESSION: 1. No evidence acute pulmonary embolism. 2. No acute or parenchymal findings. 3. Lesions within the spine and sternum consistent with given history of multiple myeloma.   Electronically Signed   By: Suzy Bouchard M.D.   On: 02/03/2015 19:45    EKG: Independently reviewed.  Sinus rhythm.  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  No acute ST elevations.  Assessment/Plan Active Problems:   HIV disease   Multiple myeloma in relapse   Pacemaker   Chest discomfort   Chest pain  1.  Admit to telemetry.  Cardiology consult greatly appreciated.  Agree with echo and stress test, as ordered for tomorrow.  No ominous findings on CTA of the chest.   No IV heparin unless troponin continues to trend upward.  Full strength aspirin administered by EMS; start baby aspirin daily in AM.  Continue plavix.  Long-acting nitrate added per cardiology recommendations.  IV morphine prn, as BP allows; good response in the ED.  GI cocktail prn.  2. Hx of HIV, off meds for several days.  Recommend contacting ID in AM (office number listed above) before resuming.  3.  Hx of MM, will be due for treatment, if appropriate on Thursday.  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  Continue to monitor.   Code Status: FULL  Time spent: 65 minutes  [employer] [facility]  02/03/2015, 8:07 PM

## 2015-02-03 NOTE — ED Notes (Signed)
Attempted report 

## 2015-02-03 NOTE — Consult Note (Signed)
Name: Rick Potter is a 69 y.o. male Admit date: 02/03/2015 Referring Physician:  ED Primary Physician:  Zola Button Primary Cardiologist:  None  Reason for Consultation:  Chest Pain  ASSESSMENT: 1. Six-week history of chest discomfort, poorly characterized 2. 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 Multiple myeloma, on chemotherapy 4. HIV infection 5. History of transient ischemic attack 6. History of DDD pacemaker, with no evidence of pacing on admitting EKG 7. Hyperlipidemia  PLAN: 1. Serial cardiac markers including non point-of-care 2. 2-D Doppler echocardiogram to assess LV size and function. 3. Depending upon database, the patient may need to have either a stress myocardial perfusion study or invasive evaluation. 4. Add long-acting nitrate 5. Would not add anticoagulation unless multiple markers elevated 6. Agree with CT scan to exclude the possibility of PE or other explanation for the patient's current complaints. 7. Add low-dose long-acting nitrate   HPI: 69 year old gentleman with multiple myeloma, HIV infection, history of cough syncope with permanent DDD pacemaker, prior history of tobacco use, and previous TIA presents with a history of postprandial chest discomfort. The discomfort has been occurring for approximately 6 weeks. It occasionally awakens him from sleep. When the discomfort is present he states standing rather than lying or sitting helps to relieve the discomfort. He denies palpitations. Today while trying see Dr. Paulita Fujita, at [place], he felt that there was some worsening of the discomfort with ambulation. There is no tenderness in the chest. Does no pleuritic component. He has had fatigue and dyspnea. His appetite is poor and he has lost approximately 45 pounds over the past year.  PMH:   Past Medical History  Diagnosis Date  . Multiple myeloma 2011  . HIV infection   . Hyperlipidemia   . Diabetes mellitus  without complication   . Multiple myeloma     PSH:   Past Surgical History  Procedure Laterality Date  . Neck surgery    . Psychologist, forensic    . Hernia repair     Allergies:  Review of patient's allergies indicates no known allergies. Prior to Admit Meds:   (Not in a hospital admission) Fam HX:    Family History  Problem Relation Age of Onset  . Stroke Mother   . Cancer Sister   . Diabetes Brother   . Alcohol abuse Brother    Social HX:    History   Social History  . Marital Status: Single    Spouse Name: N/A  . Number of Children: N/A  . Years of Education: N/A   Occupational History  . Not on file.   Social History Main Topics  . Smoking status: Former Smoker    Types: Cigarettes    Quit date: 12/06/1997  . Smokeless tobacco: Not on file  . Alcohol Use: No  . Drug Use: No  . Sexual Activity: Not on file     Comment: declined condoms   Other Topics Concern  . Not on file   Social History Narrative     Review of Systems: Denies dysphagia. No chills or fever. No peripheral edema. He has not had hematemesis, hemoptysis, hematochezia, abdominal pain, neck pain, or recurrent syncope.  Physical Exam: Blood pressure 108/79, pulse 97, temperature 97.8 F (36.6 C), resp. rate 18, height 5' 9"  (1.753 m), weight 128 lb (58.06 kg), SpO2 96 %. Weight change:   Very slender somewhat malnourished appearing gentleman who is in no acute distress  HEENT exam reveals no obvious pallor  or jaundice.  Neck exam reveals no JVD or carotid bruits  Chest is clear to auscultation and percussion  Cardiac exam reveals a gallop, rub, click, or murmur.  Abdomen is soft. Liver and spleen are not palpable.  Extremities reveal no edema. Radial pulses are 2+ and symmetric.  Neurological exam is unremarkable.  Labs: Lab Results  Component Value Date   WBC 4.1 02/03/2015   HGB 10.9* 02/03/2015   HCT 32.4* 02/03/2015   MCV 87.6 02/03/2015   PLT 143* 02/03/2015    Recent Labs Lab  02/03/15 1707  NA 137  K 3.6  CL 105  CO2 26  BUN 8  CREATININE 0.87  CALCIUM 9.2  PROT 5.4*  BILITOT 0.8  ALKPHOS 67  ALT 54*  AST 62*  GLUCOSE 110*     Lab Results  Component Value Date   CHOL 201* 06/11/2014   Lab Results  Component Value Date   HDL 59 06/11/2014   Lab Results  Component Value Date   LDLCALC 124* 06/11/2014   Lab Results  Component Value Date   TRIG 90 06/11/2014   Lab Results  Component Value Date   CHOLHDL 3.4 06/11/2014   No results found for: [facility]    Radiology: Prior CT of the chest in January reveal multiple osseous lesions compatible with multiple myeloma   EKG:  Sinus tachycardia, right bundle, left anterior hemiblock.   Sinclair Grooms 02/03/2015 6:20 PM

## 2015-02-03 NOTE — Progress Notes (Signed)
Pt arrived to unit having chest pain. 1 SL Nirto admin; pt now chest pain free. 12Lead EKG done; see chart. VSS. Pt also put on N/C 2L.  M.D paged [place] increased Troponin .32 . No new orders given. Will cont to monitor.

## 2015-02-03 NOTE — Progress Notes (Signed)
Received report from Ben, RN

## 2015-02-03 NOTE — ED Notes (Signed)
[place], Dr Eulas Post

## 2015-02-03 NOTE — ED Notes (Signed)
Pt was at PCP to be seen for recurrent CP (x 1.5 months) when he had an episode of fatigue, SOB, and CP 10/10. Pt was given 324 ASA, 1 nitro-- pain now 5/10. Pt reporting that he experiencing pain every day, keeps getting worse. Pt also has multiple myeloma, HIV- reports has not taken meds in 1 week. Pt tearful at this time.

## 2015-02-03 NOTE — Progress Notes (Signed)
ANTICOAGULATION CONSULT NOTE - Initial Consult  Pharmacy Consult for heparin Indication: chest pain/ACS  No Known Allergies  Patient Measurements: Height: _0  (175.3 cm) Weight: 128 lb (58.06 kg) IBW/kg (Calculated) : 70.7 Heparin Dosing Weight: 58 kg  Vital Signs: Temp: 97.8 F (36.6 C) (02/29 1621) BP: 108/79 mmHg (02/29 1621) Pulse Rate: 97 (02/29 1621)  Labs:  Recent Labs  02/03/15 1707  HGB 10.9*  HCT 32.4*  PLT 143*    Estimated Creatinine Clearance: 74.6 mL/min (by C-G formula based on Cr of 0.7).   Medical History: Past Medical History  Diagnosis Date  . Multiple myeloma 2011  . HIV infection   . Hyperlipidemia   . Diabetes mellitus without complication   . Multiple myeloma     Medications:   (Not in a hospital admission)  Assessment: 69 yo male presents with chest pain. Has a history of HIV and multiple myeloma - for which he is receiving weekly Velcade, last treatment was 2/25. Pharmacy consulted to initiate heparin for rule out ACS. Pt had CT chest in Jan 2016 which ruled out PE. Current hgb 10.9, plts 143.   Goal of Therapy:  Heparin level 0.3-0.7 units/ml Monitor platelets by anticoagulation protocol: Yes   Plan:  Heparin 4000 units x1, then 700 units/hr Daily HL, CBC Check HL tonight Monitor for s/sx bleeding   Hughes Better, PharmD, BCPS Clinical Pharmacist Pager: [phone] 02/03/2015 5:58 PM

## 2015-02-04 ENCOUNTER — Encounter (HOSPITAL_COMMUNITY): Payer: Medicare Other

## 2015-02-04 ENCOUNTER — Inpatient Hospital Stay (HOSPITAL_COMMUNITY): Payer: Medicare Other

## 2015-02-04 ENCOUNTER — Encounter (HOSPITAL_COMMUNITY): Payer: Self-pay | Admitting: General Practice

## 2015-02-04 ENCOUNTER — Encounter (HOSPITAL_COMMUNITY)
Admission: EM | Disposition: A | Discharge status: Home / Self Care | Payer: Self-pay | Source: Home / Self Care | Attending: Internal Medicine

## 2015-02-04 DIAGNOSIS — I25119 Atherosclerotic heart disease of native coronary artery with unspecified angina pectoris: Secondary | ICD-10-CM | POA: Diagnosis not present

## 2015-02-04 DIAGNOSIS — Z21 Asymptomatic human immunodeficiency virus [HIV] infection status: Secondary | ICD-10-CM | POA: Diagnosis not present

## 2015-02-04 DIAGNOSIS — R079 Chest pain, unspecified: Secondary | ICD-10-CM

## 2015-02-04 DIAGNOSIS — E785 Hyperlipidemia, unspecified: Secondary | ICD-10-CM | POA: Diagnosis not present

## 2015-02-04 DIAGNOSIS — C9002 Multiple myeloma in relapse: Secondary | ICD-10-CM | POA: Diagnosis not present

## 2015-02-04 DIAGNOSIS — R7989 Other specified abnormal findings of blood chemistry: Secondary | ICD-10-CM | POA: Insufficient documentation

## 2015-02-04 DIAGNOSIS — R0789 Other chest pain: Secondary | ICD-10-CM | POA: Diagnosis not present

## 2015-02-04 DIAGNOSIS — I2510 Atherosclerotic heart disease of native coronary artery without angina pectoris: Secondary | ICD-10-CM

## 2015-02-04 DIAGNOSIS — R740 Nonspecific elevation of levels of transaminase and lactic acid dehydrogenase [LDH]: Secondary | ICD-10-CM

## 2015-02-04 DIAGNOSIS — I214 Non-ST elevation (NSTEMI) myocardial infarction: Secondary | ICD-10-CM | POA: Diagnosis not present

## 2015-02-04 DIAGNOSIS — B20 Human immunodeficiency virus [HIV] disease: Secondary | ICD-10-CM | POA: Diagnosis not present

## 2015-02-04 DIAGNOSIS — E43 Unspecified severe protein-calorie malnutrition: Secondary | ICD-10-CM | POA: Insufficient documentation

## 2015-02-04 DIAGNOSIS — R778 Other specified abnormalities of plasma proteins: Secondary | ICD-10-CM | POA: Insufficient documentation

## 2015-02-04 DIAGNOSIS — Z950 Presence of cardiac pacemaker: Secondary | ICD-10-CM | POA: Diagnosis not present

## 2015-02-04 DIAGNOSIS — I209 Angina pectoris, unspecified: Secondary | ICD-10-CM

## 2015-02-04 HISTORY — PX: LEFT HEART CATHETERIZATION WITH CORONARY ANGIOGRAM: SHX5451

## 2015-02-04 LAB — LIPID PANEL
CHOL/HDL RATIO: 3.9 ratio
Cholesterol: 152 mg/dL (ref 0–200)
HDL: 39 mg/dL — AB (ref >39)
LDL CALC: 102 mg/dL — AB (ref 0–99)
TRIGLYCERIDES: 57 mg/dL (ref <150)
VLDL: 11 mg/dL (ref 0–40)

## 2015-02-04 LAB — TROPONIN I
Troponin I: 0.37 ng/mL — ABNORMAL HIGH (ref <0.031)
Troponin I: 0.61 ng/mL (ref <0.031)

## 2015-02-04 LAB — CBC
HCT: 30.8 % — ABNORMAL LOW (ref 39.0–52.0)
HEMATOCRIT: 34.4 % — AB (ref 39.0–52.0)
HEMOGLOBIN: 10.4 g/dL — AB (ref 13.0–17.0)
HEMOGLOBIN: 11.5 g/dL — AB (ref 13.0–17.0)
MCH: 29.7 pg (ref 26.0–34.0)
MCH: 29.7 pg (ref 26.0–34.0)
MCHC: 33.8 g/dL (ref 30.0–36.0)
MCHC: 33.4 g/dL (ref 30.0–36.0)
MCV: 88 fL (ref 78.0–100.0)
MCV: 88.9 fL (ref 78.0–100.0)
Platelets: 130 10*3/uL — ABNORMAL LOW (ref 150–400)
Platelets: 154 10*3/uL (ref 150–400)
RBC: 3.5 MIL/uL — ABNORMAL LOW (ref 4.22–5.81)
RBC: 3.87 MIL/uL — ABNORMAL LOW (ref 4.22–5.81)
RDW: 12.7 % (ref 11.5–15.5)
RDW: 12.7 % (ref 11.5–15.5)
WBC: 3.1 10*3/uL — AB (ref 4.0–10.5)
WBC: 3.9 10*3/uL — ABNORMAL LOW (ref 4.0–10.5)

## 2015-02-04 LAB — PROTIME-INR
INR: 1.29 (ref 0.00–1.49)
Prothrombin Time: 16.2 seconds — ABNORMAL HIGH (ref 11.6–15.2)

## 2015-02-04 LAB — BASIC METABOLIC PANEL
ANION GAP: 9 (ref 5–15)
BUN: 6 mg/dL (ref 6–23)
CO2: 24 mmol/L (ref 19–32)
Calcium: 9.2 mg/dL (ref 8.4–10.5)
Chloride: 106 mmol/L (ref 96–112)
Creatinine, Ser: 0.79 mg/dL (ref 0.50–1.35)
GFR calc Af Amer: >90 mL/min (ref >90)
GFR calc non Af Amer: >90 mL/min (ref >90)
GLUCOSE: 92 mg/dL (ref 70–99)
Potassium: 3.7 mmol/L (ref 3.5–5.1)
Sodium: 139 mmol/L (ref 135–145)

## 2015-02-04 LAB — HEPARIN LEVEL (UNFRACTIONATED)
Heparin Unfractionated: 0.61 IU/mL (ref 0.30–0.70)
Heparin Unfractionated: 0.73 IU/mL — ABNORMAL HIGH (ref 0.30–0.70)

## 2015-02-04 LAB — PLATELET INHIBITION P2Y12: Platelet Function  P2Y12: 294 [PRU] (ref 194–418)

## 2015-02-04 SURGERY — LEFT HEART CATHETERIZATION WITH CORONARY ANGIOGRAM

## 2015-02-04 MED ORDER — NITROGLYCERIN 1 MG/10 ML FOR IR/CATH LAB
INTRA_ARTERIAL | Status: AC
  Filled 2015-02-04: qty 10

## 2015-02-04 MED ORDER — REGADENOSON 0.4 MG/5ML IV SOLN
0.4000 mg | Freq: Once | INTRAVENOUS | Status: AC
  Filled 2015-02-04: qty 5

## 2015-02-04 MED ORDER — REGADENOSON 0.4 MG/5ML IV SOLN
INTRAVENOUS | Status: AC
  Filled 2015-02-04: qty 5

## 2015-02-04 MED ORDER — HEPARIN SODIUM (PORCINE) 1000 UNIT/ML IJ SOLN
INTRAMUSCULAR | Status: AC
  Filled 2015-02-04: qty 1

## 2015-02-04 MED ORDER — TECHNETIUM TC 99M SESTAMIBI GENERIC - CARDIOLITE
10.0000 | Freq: Once | INTRAVENOUS | Status: AC | PRN
  Administered 2015-02-04: 10 via INTRAVENOUS

## 2015-02-04 MED ORDER — VERAPAMIL HCL 2.5 MG/ML IV SOLN
INTRAVENOUS | Status: AC
  Filled 2015-02-04: qty 2

## 2015-02-04 MED ORDER — SODIUM CHLORIDE 0.9 % IV SOLN: 250.0000 mL | INTRAVENOUS | Status: DC | PRN

## 2015-02-04 MED ORDER — SODIUM CHLORIDE 0.9 % IJ SOLN: 3.0000 mL | INTRAMUSCULAR | Status: DC | PRN

## 2015-02-04 MED ORDER — HEPARIN (PORCINE) IN NACL 100-0.45 UNIT/ML-% IJ SOLN
600.0000 [IU]/h | INTRAMUSCULAR | Status: DC
  Filled 2015-02-04: qty 250

## 2015-02-04 MED ORDER — FENTANYL CITRATE 0.05 MG/ML IJ SOLN
INTRAMUSCULAR | Status: AC
  Filled 2015-02-04: qty 2

## 2015-02-04 MED ORDER — MIDAZOLAM HCL 2 MG/2ML IJ SOLN
INTRAMUSCULAR | Status: AC
  Filled 2015-02-04: qty 2

## 2015-02-04 MED ORDER — LIDOCAINE HCL (PF) 1 % IJ SOLN
INTRAMUSCULAR | Status: AC
  Filled 2015-02-04: qty 30

## 2015-02-04 MED ORDER — BOOST / RESOURCE BREEZE PO LIQD
1.0000 | Freq: Three times a day (TID) | ORAL | Status: DC
Start: 2015-02-05 — End: 2015-02-09
  Administered 2015-02-05 – 2015-02-09 (×9): 1 via ORAL

## 2015-02-04 MED ORDER — SODIUM CHLORIDE 0.9 % IV SOLN: 1.0000 mL/kg/h | INTRAVENOUS | Status: AC

## 2015-02-04 MED ORDER — SODIUM CHLORIDE 0.9 % IV SOLN
INTRAVENOUS | Status: DC
  Administered 2015-02-04: 12:00:00 via INTRAVENOUS

## 2015-02-04 MED ORDER — ASPIRIN 81 MG PO CHEW: 81.0000 mg | CHEWABLE_TABLET | ORAL | Status: DC

## 2015-02-04 MED ORDER — HEPARIN (PORCINE) IN NACL 2-0.9 UNIT/ML-% IJ SOLN
INTRAMUSCULAR | Status: AC
  Filled 2015-02-04: qty 1000

## 2015-02-04 MED ORDER — SODIUM CHLORIDE 0.9 % IJ SOLN: 3.0000 mL | Freq: Two times a day (BID) | INTRAMUSCULAR | Status: DC

## 2015-02-04 MED ORDER — OXYCODONE HCL 5 MG PO TABS
5.0000 mg | ORAL_TABLET | ORAL | Status: DC | PRN
  Filled 2015-02-04: qty 1

## 2015-02-04 NOTE — Progress Notes (Signed)
ANTICOAGULATION CONSULT NOTE - Follow Up Consult  Pharmacy Consult for heparin Indication: chest pain/ACS  Labs:  Recent Labs  02/03/15 1707 02/03/15 2020  HGB 10.9*  --   HCT 32.4*  --   PLT 143*  --   LABPROT  --  16.1*  INR  --  1.28  CREATININE 0.87  --   TROPONINI  --  0.32*    Assessment: 69yo male w/ complex PMH c/o recurrent CP that worsened today w/ exertion, initially start on heparin but then d/c'd w/ plan to resume if troponin increased, troponin now mildly elevated.  Goal of Therapy:  Heparin level 0.3-0.7 units/ml Monitor platelets by anticoagulation protocol: Yes   Plan:  Rec'd heparin 4000 unit IV bolus at 2013; will resume heparin gtt at 700 units/hr and monitor heparin levels and CBC.  Wynona Neat, PharmD, BCPS  02/04/2015,12:16 AM

## 2015-02-04 NOTE — Progress Notes (Signed)
ANTICOAGULATION CONSULT NOTE - Initial Consult  Pharmacy Consult for heparin Indication: chest pain/ACS  No Known Allergies  Patient Measurements: Height: 5' 9" (175.3 cm) Weight: 128 lb (58.06 kg) IBW/kg (Calculated) : 70.7 Heparin Dosing Weight: 58 kg  Vital Signs: Temp: 98.1 F (36.7 C) (03/01 0648) Temp Source: Oral (03/01 0648) BP: 95/62 mmHg (03/01 0648) Pulse Rate: 88 (03/01 0648)  Labs:  Recent Labs  02/03/15 1707 02/03/15 2020 02/04/15 0024 02/04/15 0626 02/04/15 0802  HGB 10.9*  --   --  10.4*  --   HCT 32.4*  --   --  30.8*  --   PLT 143*  --   --  130*  --   LABPROT  --  16.1*  --   --   --   INR  --  1.28  --   --   --   HEPARINUNFRC  --   --   --   --  0.61  CREATININE 0.87  --   --   --   --   TROPONINI  --  0.32* 0.37* 0.61*  --     Estimated Creatinine Clearance: 68.6 mL/min (by C-G formula based on Cr of 0.87).   Medical History: Past Medical History  Diagnosis Date  . HIV infection dx'd 1990    Viral load undetectable in January with CD4 count of 340  . Hyperlipidemia     Patient denies this hx on 02/04/2015  . Presence of permanent cardiac pacemaker   . TIA (transient ischemic attack) ~ 2005  . Borderline type 2 diabetes mellitus     "Borderline" under surveillance, no medical therapy  . Multiple myeloma dx'd 2011    Medications:  Prescriptions prior to admission  Medication Sig Dispense Refill Last Dose  . clopidogrel (PLAVIX) 75 MG tablet Take 75 mg by mouth daily.   02/02/2015 at Unknown time  . darunavir-cobicistat (PREZCOBIX) 800-150 MG per tablet Take 1 tablet by mouth daily. Swallow whole. Do NOT crush, break or chew tablets. Take with food. 30 tablet 11 02/02/2015 at Unknown time  . dexamethasone (DECADRON) 4 MG tablet Take 5 tablets weekly with chemotherapy. 40 tablet 3 Past Week at unknown  . emtricitabine-tenofovir (TRUVADA) 200-300 MG per tablet Take 1 tablet by mouth daily. 30 tablet 11 02/02/2015 at Unknown time  .  HYDROcodone-acetaminophen (NORCO) 10-325 MG per tablet Take 1 tablet by mouth every 6 (six) hours as needed. 30 tablet 0 Past Week at Unknown time  . prochlorperazine (COMPAZINE) 10 MG tablet Take 1 tablet (10 mg total) by mouth every 6 (six) hours as needed for nausea or vomiting. 30 tablet 0 Past Week at Unknown time    Assessment: 69 yo male presents with chest pain. Has a history of HIV and multiple myeloma - for which he is receiving weekly Velcade, last treatment was 2/25. Pharmacy consulted to initiate heparin for rule out ACS. Pt had CT chest in Jan 2016 which ruled out PE. Current hgb 10.9, plts 143.   Trop 0.32 > 0.37 > 0.61  Initial HL is therapeutic at 0.61 on heparin 700 units/hr. No bleeding noted.  Goal of Therapy:  Heparin level 0.3-0.7 units/ml Monitor platelets by anticoagulation protocol: Yes   Plan:  Continue heparin 700 units/hr 6h confirmatory HL Daily HL, CBC Monitor for s/sx bleeding  Andrey Cota. Diona Foley, PharmD Clinical Pharmacist Pager [phone]  02/04/2015 8:52 AM

## 2015-02-04 NOTE — Progress Notes (Signed)
TR Band removed. R Radial Level 0. Pressure dressing applied. Pt educated to keep dressing in place for 24 hours. Will cont to monitor.

## 2015-02-04 NOTE — Interval H&P Note (Signed)
History and Physical Interval Note:  02/04/2015 6:21 PM  Rick Potter  has presented today for surgery, with the diagnosis of cp  The various methods of treatment have been discussed with the patient and family. After consideration of risks, benefits and other options for treatment, the patient has consented to  Procedure(s): LEFT HEART CATHETERIZATION WITH CORONARY ANGIOGRAM (N/A) as a surgical intervention .  The patient's history has been reviewed, patient examined, no change in status, stable for surgery.  I have reviewed the patient's chart and labs.  Questions were answered to the patient's satisfaction.   Cath Lab Visit (complete for each Cath Lab visit)  Clinical Evaluation Leading to the Procedure:   ACS: Yes.    Non-ACS:    Anginal Classification: CCS III  Anti-ischemic medical therapy: Minimal Therapy (1 class of medications)  Non-Invasive Test Results: No non-invasive testing performed  Prior CABG: No previous CABG        [facility] 02/04/2015 6:22 PM

## 2015-02-04 NOTE — Progress Notes (Signed)
INITIAL NUTRITION ASSESSMENT  Pt meets criteria for SEVERE MALNUTRITION in the context of chronic illness as evidenced by a 11% weight loss in in 6 months, energy intake <75% for >/= 1 month, and severe muscle mass loss.  DOCUMENTATION CODES Per approved criteria  -Severe malnutrition in the context of chronic illness   INTERVENTION: Once diet advances, provide Resource Breeze po TID, each supplement provides 250 kcal and 9 grams of protein.  NUTRITION DIAGNOSIS: Malnutrition related to chronic illness as evidenced by 11% weight loss in 6 months and severe muscle mass loss.   Goal: Pt to meet >/= 90% of their estimated nutrition needs   Monitor:  Diet advancement, weight trends, labs, I/O's  Reason for Assessment: MST  69 y.o. male  Admitting Dx: <principal problem not specified>  ASSESSMENT: Pt with a PMHx significant for MM (currently on Velcade and decadron, administered once weekly on Thursdays) and HIV (well-controlled, last viral load in January undetectable) who presents for evaluation of recurrent exertional chest pain that has worsened in intensity over the past several days.  Pt currently NPO for L heart cath planned for today. Pt reports over the past 3 months he has had a decreased appetite with on average 2 meals a day. Pt reports prior to his decrease appetite, he was eating fine with 3 meals a day and Boost daily. Pt reports lately Boost has been causing him abdominal pains and does not drink it anymore. Pt reports weight loss with usual body weight of 140 lbs. Noted pt with a 11% weight loss in 6 months. Pt is agreeable to [employer] once diet advances. RD to order.   Nutrition Focused Physical Exam:  Subcutaneous Fat:  Orbital Region: N/A Upper Arm Region: Moderate depletion Thoracic and Lumbar Region: Moderate depletion  Muscle:  Temple Region: N/A Clavicle Bone Region: Severe depletion Clavicle and Acromion Bone Region: Severe depletion Scapular Bone  Region: N/A Dorsal Hand: N/A Patellar Region: Severe depletion Anterior Thigh Region: Severe depletion Posterior Calf Region: Severe depletion  Edema: none  Labs and medication reviewed.  Height: Ht Readings from Last 1 Encounters:  02/03/15 5' 9"  (1.753 m)    Weight: Wt Readings from Last 1 Encounters:  02/03/15 128 lb (58.06 kg)    Ideal Body Weight: 160 lbs  % Ideal Body Weight: 80%  Wt Readings from Last 10 Encounters:  02/03/15 128 lb (58.06 kg)  01/29/15 129 lb 1.6 oz (58.559 kg)  01/14/15 126 lb (57.153 kg)  12/24/14 132 lb 9.6 oz (60.147 kg)  12/09/14 135 lb (61.236 kg)  11/20/14 134 lb 1.6 oz (60.827 kg)  09/20/14 141 lb 8 oz (64.184 kg)  09/02/14 142 lb (64.411 kg)  08/22/14 140 lb (63.504 kg)  07/12/14 144 lb 8 oz (65.545 kg)    Usual Body Weight: 140 lbs  % Usual Body Weight: 91%  BMI:  Body mass index is 18.89 kg/(m^2).  Estimated Nutritional Needs: Kcal: 2000-2200 Protein: 85-105 grams Fluid: 2 - 2.2 L/day  Skin: intact  Diet Order:  NPO  EDUCATION NEEDS: -No education needs identified at this time  No intake or output data in the 24 hours ending 02/04/15 1023  Last BM: PTA  Labs:   Recent Labs Lab 01/29/15 1401 02/03/15 1707  NA 140 137  K 3.8 3.6  CL  --  105  CO2 24 26  BUN 8.1 8  CREATININE 0.7 0.87  CALCIUM 9.0 9.2  GLUCOSE 91 110*    CBG (last 3)  No  results for input(s): GLUCAP in the last 72 hours.  Scheduled Meds: . aspirin  81 mg Oral Daily  . clopidogrel  75 mg Oral Daily  . isosorbide mononitrate  30 mg Oral Daily  . regadenoson      . regadenoson  0.4 mg Intravenous Once    Continuous Infusions: . sodium chloride    . heparin 700 Units/hr (02/04/15 0731)    Past Medical History  Diagnosis Date  . HIV infection dx'd 1990    Viral load undetectable in January with CD4 count of 340  . Hyperlipidemia     Patient denies this hx on 02/04/2015  . Presence of permanent cardiac pacemaker   . TIA  (transient ischemic attack) ~ 2005  . Borderline type 2 diabetes mellitus     "Borderline" under surveillance, no medical therapy  . Multiple myeloma dx'd 2011    Past Surgical History  Procedure Laterality Date  . Cervical laminectomy  ~ 2012  . Insert / replace / remove pacemaker  ~ 2003  . Inguinal hernia repair Bilateral ~ 1999  . Hernia repair  ~ 1999    UHR  . Umbilical hernia repair    . Mediport insertion, single Right ~ 2012    Kallie Locks, MS, RD, LDN Pager # [phone] After hours/ weekend pager # [phone]

## 2015-02-04 NOTE — Progress Notes (Signed)
M.D Freidman paged regarding increased Troponin .37. No new orders given. Will cont to monitor.

## 2015-02-04 NOTE — Progress Notes (Signed)
SUBJECTIVE:  Continues to complain of chest pain  OBJECTIVE:   Vitals:   Filed Vitals:   02/03/15 2030 02/03/15 2124 02/03/15 2149 02/04/15 0648  BP: 104/77 112/83 103/77 95/62  Pulse: 104 107 102 88  Temp:  97.4 F (36.3 C)  98.1 F (36.7 C)  TempSrc:  Oral  Oral  Resp: _0 Height:  _1  (1.753 m)    Weight:      SpO2: 99% 100% 100% 99%   I&O's:  No intake or output data in the 24 hours ending 02/04/15 0945 TELEMETRY: Reviewed telemetry pt in NSR:     PHYSICAL EXAM General: Well developed, well nourished, in no acute distress Head: Eyes PERRLA, No xanthomas.   Normal cephalic and atramatic  Lungs:   Clear bilaterally to auscultation and percussion. Heart:   HRRR S1 S2 Pulses are 2+ & equal. Abdomen: Bowel sounds are positive, abdomen soft and non-tender without massesExtremities:   No clubbing, cyanosis or edema.  DP +1 Neuro: Alert and oriented X 3. Psych:  Good affect, responds appropriately   LABS: Basic Metabolic Panel:  Recent Labs  02/03/15 1707  NA 137  K 3.6  CL 105  CO2 26  GLUCOSE 110*  BUN 8  CREATININE 0.87  CALCIUM 9.2   Liver Function Tests:  Recent Labs  02/03/15 1707  AST 62*  ALT 54*  ALKPHOS 67  BILITOT 0.8  PROT 5.4*  ALBUMIN 3.5    Recent Labs  02/03/15 1707  LIPASE 42   CBC:  Recent Labs  02/03/15 1707 02/04/15 0626  WBC 4.1 3.1*  NEUTROABS 2.1  --   HGB 10.9* 10.4*  HCT 32.4* 30.8*  MCV 87.6 88.0  PLT 143* 130*   Cardiac Enzymes:  Recent Labs  02/03/15 2020 02/04/15 0024 02/04/15 0626  TROPONINI 0.32* 0.37* 0.61*   BNP: Invalid input(s): POCBNP D-Dimer: No results for input(s): DDIMER in the last 72 hours. Hemoglobin A1C: No results for input(s): HGBA1C in the last 72 hours. Fasting Lipid Panel:  Recent Labs  02/04/15 0024  CHOL 152  HDL 39*  LDLCALC 102*  TRIG 57  CHOLHDL 3.9   Thyroid Function Tests: No results for input(s): TSH, T4TOTAL, T3FREE, THYROIDAB in the last 72  hours.  Invalid input(s): FREET3 Anemia Panel: No results for input(s): VITAMINB12, FOLATE, FERRITIN, TIBC, IRON, RETICCTPCT in the last 72 hours. Coag Panel:   Lab Results  Component Value Date   INR 1.28 02/03/2015    RADIOLOGY: Dg Chest 2 View  02/03/2015   CLINICAL DATA:  Chronic chest pain for 1 month, with fatigue. Initial encounter.  EXAM: CHEST  2 VIEW  COMPARISON:  Chest radiograph and CTA of the chest performed 12/19/2014  FINDINGS: The lungs are well-aerated and clear. There is no evidence of focal opacification, pleural effusion or pneumothorax. A right-sided chest port is noted ending about the distal SVC.  The heart is normal in size; a pacemaker is noted at the left chest wall, with leads ending at the right atrium and right ventricle. No acute osseous abnormalities are seen. Cervical spinal fusion hardware is partially imaged.  IMPRESSION: No acute cardiopulmonary process seen.   Electronically Signed   By: Garald Balding M.D.   On: 02/03/2015 18:22   Ct Angio Chest Pe W/cm &/or Wo Cm  02/03/2015   CLINICAL DATA:  Short of breath, back pain. Concern for pulmonary embolism. History multiple myeloma  EXAM: CT ANGIOGRAPHY CHEST WITH CONTRAST  TECHNIQUE:  Multidetector CT imaging of the chest was performed using the standard protocol during bolus administration of intravenous contrast. Multiplanar CT image reconstructions and MIPs were obtained to evaluate the vascular anatomy.  CONTRAST:  36m OMNIPAQUE IOHEXOL 350 MG/ML SOLN  COMPARISON:  CT 12/19/2014  FINDINGS: Mediastinum/Nodes: No filling defects within the pulmonary arteries to suggest acute pulmonary embolism. No acute findings aorta great vessels. No pericardial fluid. Esophagus is normal.  Lungs/Pleura:  Review of the lung parenchyma demonstrates no pulmonary edema, pleural fluid, or pneumothorax. 4 mm nodule in the right upper lobe on image 41, series 11. Not changed from comparison exam.  Upper abdomen: Consistent with given  history of multiple myeloma. Normal adrenal glands.  Musculoskeletal: Are multiple lytic and sclerotic lesions throughout the sternum and spine. Seventy lesions are expansile.  Review of the MIP images confirms the above findings.  IMPRESSION: 1. No evidence acute pulmonary embolism. 2. No acute or parenchymal findings. 3. Lesions within the spine and sternum consistent with given history of multiple myeloma.   Electronically Signed   By: SSuzy BouchardM.D.   On: 02/03/2015 19:45   ASSESSMENT: 1. Six-week history of chest discomfort, poorly characterized.  Chest CT negative for acute PE.  He does have bony lytic lesions in his sternum from multiple myeloma which could be etiology of CP but would not explain troponin elevation and abnormal EKG.   2. 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 Multiple myeloma, on chemotherapy 4. HIV infection 5. History of transient ischemic attack 6. History of DDD pacemaker, with no evidence of pacing on admitting EKG 7. Hyperlipidemia  PLAN: 1. 2-D Doppler echocardiogram to assess LV size and function. 2. Given ongoing chest pain and cardiac enzymes trending upward (although still only mildly elevated) and abnormal EKG, will proceed with left heart catheterization.   3. Continue long acting nitrates, ASA, Plavix, IV Heparin gtt       TSueanne Margarita MD  02/04/2015  9:45 AM

## 2015-02-04 NOTE — Progress Notes (Signed)
Paged M.D regarding post cath back pain and intermittent chest pain. 12 Lead EKG done; no significant changes noted. VSS. Order given for Oxycodone for back pain. Pt asymptomatic at this time. Will cont to monitor.

## 2015-02-04 NOTE — Progress Notes (Signed)
[facility] PROGRESS NOTE  Rick Potter QQI:[id number] DOB: [birth year] DOA: 02/03/2015 PCP: [name], MD  Summary I have seen and examined Rick Potter at bedside and reviewed his chart. Appreciate cardiology. Rick Potter is a pleasant 69 y.o. gentleman with Multiple Myeloma (currently on Velcade and decadron, administered once weekly on Thursdays) and HIV (well-controlled, last viral load in January undetectable) who presented with chest pain concerning for acute coronary syndrome and he has ruled in for an NSTEMI- TROPONIN 0.61. Patient had CT chest which showed "1. No evidence acute pulmonary embolism. 2. No acute or parenchymal findings. 3. Lesions within the spine and sternum consistent with given history of multiple myeloma". I have spoken with Dr. Radford Pax who plans cardiac cath this morning. Patient reports that the pain is persistent. He has not taken HIV meds for 3-4 days because of not feeling well. Dr. Baxter Flattery will graciously see patient in consult. Will follow cardiology and ID recommendations. Keep patient nothing by mouth. Plan Pacemaker/NSTEMI (non-ST elevated myocardial infarction)/Chest pain  On aspirin/Plavix/heparin/NTG  2-D echo/cardiac cath planned. Will defer to cardiology for further management. HIV disease  ID to see regarding resuming regular meds. Multiple myeloma in relapse  No acute changes  Will defer to oncology once patient discharged Code Status: Full Family Communication: No family at bedside Disposition Plan: Eventually home   Consultants:  Cardiology  ID  Procedures:  None  Antibiotics:  None  HPI/Subjective: Has some chest pain.  Objective: Filed Vitals:   02/04/15 0648  BP: 95/62  Pulse: 88  Temp: 98.1 F (36.7 C)  Resp: 21   No intake or output data in the 24 hours ending 02/04/15 0852 Filed Weights   02/03/15 1621  Weight: 58.06 kg (128 lb)    Exam:   General:  Comfortable at rest.  Cardiovascular:  S1-S2 normal. No murmurs. Pulse regular.  Respiratory: Good air entry bilaterally. No rhonchi or rales.  Abdomen: Soft and nontender. Normal bowel sounds. No organomegaly.  Musculoskeletal: No pedal edema   Neurological: Intact  Data Reviewed: Basic Metabolic Panel:  Recent Labs Lab 01/29/15 1401 02/03/15 1707  NA 140 137  K 3.8 3.6  CL  --  105  CO2 24 26  GLUCOSE 91 110*  BUN 8.1 8  CREATININE 0.7 0.87  CALCIUM 9.0 9.2   Liver Function Tests:  Recent Labs Lab 01/29/15 1401 02/03/15 1707  AST 22 62*  ALT 33 54*  ALKPHOS 55 67  BILITOT 0.75 0.8  PROT 5.4* 5.4*  ALBUMIN 3.5 3.5    Recent Labs Lab 02/03/15 1707  LIPASE 42   No results for input(s): AMMONIA in the last 168 hours. CBC:  Recent Labs Lab 01/29/15 1401 02/03/15 1707 02/04/15 0626  WBC 3.1* 4.1 3.1*  NEUTROABS 0.9* 2.1  --   HGB 11.0* 10.9* 10.4*  HCT 34.1* 32.4* 30.8*  MCV 90.3 87.6 88.0  PLT 137* 143* 130*   Cardiac Enzymes:  Recent Labs Lab 02/03/15 2020 02/04/15 0024 02/04/15 0626  TROPONINI 0.32* 0.37* 0.61*   BNP (last 3 results)  Recent Labs  12/19/14 1633 02/03/15 1707  BNP 486.7* 621.5*    ProBNP (last 3 results) No results for input(s): PROBNP in the last 8760 hours.  CBG: No results for input(s): GLUCAP in the last 168 hours.  No results found for this or any previous visit (from the past 240 hour(s)).   Studies: Dg Chest 2 View  02/03/2015   CLINICAL DATA:  Chronic chest pain for 1 month, with fatigue.  Initial encounter.  EXAM: CHEST  2 VIEW  COMPARISON:  Chest radiograph and CTA of the chest performed 12/19/2014  FINDINGS: The lungs are well-aerated and clear. There is no evidence of focal opacification, pleural effusion or pneumothorax. A right-sided chest port is noted ending about the distal SVC.  The heart is normal in size; a pacemaker is noted at the left chest wall, with leads ending at the right atrium and right ventricle. No acute osseous  abnormalities are seen. Cervical spinal fusion hardware is partially imaged.  IMPRESSION: No acute cardiopulmonary process seen.   Electronically Signed   By: Garald Balding M.D.   On: 02/03/2015 18:22   Ct Angio Chest Pe W/cm &/or Wo Cm  02/03/2015   CLINICAL DATA:  Short of breath, back pain. Concern for pulmonary embolism. History multiple myeloma  EXAM: CT ANGIOGRAPHY CHEST WITH CONTRAST  TECHNIQUE: Multidetector CT imaging of the chest was performed using the standard protocol during bolus administration of intravenous contrast. Multiplanar CT image reconstructions and MIPs were obtained to evaluate the vascular anatomy.  CONTRAST:  1m OMNIPAQUE IOHEXOL 350 MG/ML SOLN  COMPARISON:  CT 12/19/2014  FINDINGS: Mediastinum/Nodes: No filling defects within the pulmonary arteries to suggest acute pulmonary embolism. No acute findings aorta great vessels. No pericardial fluid. Esophagus is normal.  Lungs/Pleura:  Review of the lung parenchyma demonstrates no pulmonary edema, pleural fluid, or pneumothorax. 4 mm nodule in the right upper lobe on image 41, series 11. Not changed from comparison exam.  Upper abdomen: Consistent with given history of multiple myeloma. Normal adrenal glands.  Musculoskeletal: Are multiple lytic and sclerotic lesions throughout the sternum and spine. Seventy lesions are expansile.  Review of the MIP images confirms the above findings.  IMPRESSION: 1. No evidence acute pulmonary embolism. 2. No acute or parenchymal findings. 3. Lesions within the spine and sternum consistent with given history of multiple myeloma.   Electronically Signed   By: SSuzy BouchardM.D.   On: 02/03/2015 19:45    Scheduled Meds: . aspirin  81 mg Oral Daily  . clopidogrel  75 mg Oral Daily  . isosorbide mononitrate  30 mg Oral Daily   Continuous Infusions: . heparin 700 Units/hr (02/04/15 0731)     Time spent: 25 minutes    Rick Potter  [facility] Pager [phone] If 7PM-7AM,  please contact night-coverage at [email or web address], password [email or web address] [email or web address]/12/2014, 8:52 AM  LOS: 1 day

## 2015-02-04 NOTE — H&P (View-Only) (Signed)
SUBJECTIVE:  Continues to complain of chest pain  OBJECTIVE:   Vitals:   Filed Vitals:   02/03/15 2030 02/03/15 2124 02/03/15 2149 02/04/15 0648  BP: 104/77 112/83 103/77 95/62  Pulse: 104 107 102 88  Temp:  97.4 F (36.3 C)  98.1 F (36.7 C)  TempSrc:  Oral  Oral  Resp: _0 Height:  _1  (1.753 m)    Weight:      SpO2: 99% 100% 100% 99%   I&O's:  No intake or output data in the 24 hours ending 02/04/15 0945 TELEMETRY: Reviewed telemetry pt in NSR:     PHYSICAL EXAM General: Well developed, well nourished, in no acute distress Head: Eyes PERRLA, No xanthomas.   Normal cephalic and atramatic  Lungs:   Clear bilaterally to auscultation and percussion. Heart:   HRRR S1 S2 Pulses are 2+ & equal. Abdomen: Bowel sounds are positive, abdomen soft and non-tender without massesExtremities:   No clubbing, cyanosis or edema.  DP +1 Neuro: Alert and oriented X 3. Psych:  Good affect, responds appropriately   LABS: Basic Metabolic Panel:  Recent Labs  02/03/15 1707  NA 137  K 3.6  CL 105  CO2 26  GLUCOSE 110*  BUN 8  CREATININE 0.87  CALCIUM 9.2   Liver Function Tests:  Recent Labs  02/03/15 1707  AST 62*  ALT 54*  ALKPHOS 67  BILITOT 0.8  PROT 5.4*  ALBUMIN 3.5    Recent Labs  02/03/15 1707  LIPASE 42   CBC:  Recent Labs  02/03/15 1707 02/04/15 0626  WBC 4.1 3.1*  NEUTROABS 2.1  --   HGB 10.9* 10.4*  HCT 32.4* 30.8*  MCV 87.6 88.0  PLT 143* 130*   Cardiac Enzymes:  Recent Labs  02/03/15 2020 02/04/15 0024 02/04/15 0626  TROPONINI 0.32* 0.37* 0.61*   BNP: Invalid input(s): POCBNP D-Dimer: No results for input(s): DDIMER in the last 72 hours. Hemoglobin A1C: No results for input(s): HGBA1C in the last 72 hours. Fasting Lipid Panel:  Recent Labs  02/04/15 0024  CHOL 152  HDL 39*  LDLCALC 102*  TRIG 57  CHOLHDL 3.9   Thyroid Function Tests: No results for input(s): TSH, T4TOTAL, T3FREE, THYROIDAB in the last 72  hours.  Invalid input(s): FREET3 Anemia Panel: No results for input(s): VITAMINB12, FOLATE, FERRITIN, TIBC, IRON, RETICCTPCT in the last 72 hours. Coag Panel:   Lab Results  Component Value Date   INR 1.28 02/03/2015    RADIOLOGY: Dg Chest 2 View  02/03/2015   CLINICAL DATA:  Chronic chest pain for 1 month, with fatigue. Initial encounter.  EXAM: CHEST  2 VIEW  COMPARISON:  Chest radiograph and CTA of the chest performed 12/19/2014  FINDINGS: The lungs are well-aerated and clear. There is no evidence of focal opacification, pleural effusion or pneumothorax. A right-sided chest port is noted ending about the distal SVC.  The heart is normal in size; a pacemaker is noted at the left chest wall, with leads ending at the right atrium and right ventricle. No acute osseous abnormalities are seen. Cervical spinal fusion hardware is partially imaged.  IMPRESSION: No acute cardiopulmonary process seen.   Electronically Signed   By: Garald Balding M.D.   On: 02/03/2015 18:22   Ct Angio Chest Pe W/cm &/or Wo Cm  02/03/2015   CLINICAL DATA:  Short of breath, back pain. Concern for pulmonary embolism. History multiple myeloma  EXAM: CT ANGIOGRAPHY CHEST WITH CONTRAST  TECHNIQUE:  Multidetector CT imaging of the chest was performed using the standard protocol during bolus administration of intravenous contrast. Multiplanar CT image reconstructions and MIPs were obtained to evaluate the vascular anatomy.  CONTRAST:  36m OMNIPAQUE IOHEXOL 350 MG/ML SOLN  COMPARISON:  CT 12/19/2014  FINDINGS: Mediastinum/Nodes: No filling defects within the pulmonary arteries to suggest acute pulmonary embolism. No acute findings aorta great vessels. No pericardial fluid. Esophagus is normal.  Lungs/Pleura:  Review of the lung parenchyma demonstrates no pulmonary edema, pleural fluid, or pneumothorax. 4 mm nodule in the right upper lobe on image 41, series 11. Not changed from comparison exam.  Upper abdomen: Consistent with given  history of multiple myeloma. Normal adrenal glands.  Musculoskeletal: Are multiple lytic and sclerotic lesions throughout the sternum and spine. Seventy lesions are expansile.  Review of the MIP images confirms the above findings.  IMPRESSION: 1. No evidence acute pulmonary embolism. 2. No acute or parenchymal findings. 3. Lesions within the spine and sternum consistent with given history of multiple myeloma.   Electronically Signed   By: SSuzy BouchardM.D.   On: 02/03/2015 19:45   ASSESSMENT: 1. Six-week history of chest discomfort, poorly characterized.  Chest CT negative for acute PE.  He does have bony lytic lesions in his sternum from multiple myeloma which could be etiology of CP but would not explain troponin elevation and abnormal EKG.   2. 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 Multiple myeloma, on chemotherapy 4. HIV infection 5. History of transient ischemic attack 6. History of DDD pacemaker, with no evidence of pacing on admitting EKG 7. Hyperlipidemia  PLAN: 1. 2-D Doppler echocardiogram to assess LV size and function. 2. Given ongoing chest pain and cardiac enzymes trending upward (although still only mildly elevated) and abnormal EKG, will proceed with left heart catheterization.   3. Continue long acting nitrates, ASA, Plavix, IV Heparin gtt       TSueanne Margarita MD  02/04/2015  9:45 AM

## 2015-02-04 NOTE — CV Procedure (Signed)
    Cardiac Catheterization Procedure Note  Name: Tahsin Benyo MRN: [id number] DOB: [birth year]  Procedure: Left Heart Cath, Selective Coronary Angiography, LV angiography  Indication: 69 yo BM with history of myeloma. Presents with atypical chest pain and elevated troponin.    Procedural Details: The right wrist was prepped, draped, and anesthetized with 1% lidocaine. Using the modified Seldinger technique, a 6 French slender sheath was introduced into the right radial artery. 3 mg of verapamil was administered through the sheath, weight-based unfractionated heparin was administered intravenously. Standard Judkins catheters were used for selective coronary angiography and left ventricular pressures. Catheter exchanges were performed over an exchange length guidewire. There were no immediate procedural complications. A TR band was used for radial hemostasis at the completion of the procedure.  The patient was transferred to the post catheterization recovery area for further monitoring. 40 cc of contrast used.  Procedural Findings: Hemodynamics: AO 96/69 mean 83 mm Hg LV 95/18 mm Hg  Coronary angiography: Coronary dominance: right  Left mainstem: normal.  Left anterior descending (LAD): the LAD has diffuse irregularities less than 20%. The first diagonal has 30-40% stenosis in the mid vessel.   Left circumflex (LCx): Normal  Right coronary artery (RCA): mild irregularities in the mid vessel up to 10-20%.   Left ventriculography: Not done  Final Conclusions:   1. Mild nonobstructive CAD   Recommendations: medical management.   Peter Martinique, [place]  02/04/2015, 6:40 PM

## 2015-02-04 NOTE — Consult Note (Signed)
[facility]  Total days of antibiotics 0       Reason for Consult: chest pain in HIV patient    Referring Physician: Ranga  Active Problems:   HIV disease   Multiple myeloma in relapse   Pacemaker   NSTEMI (non-ST elevated myocardial infarction)   Chest pain   Angina pectoris   Elevated troponin    HPI: Rick Potter is a 69 y.o. male with HIV disease, CD 4 count of 340/VL<20 (jan 2016) on truvada/prezcobix, also has hx of kappa light chain multiple myeloma dx in 2011 with lytic bone lesions and 84% plasma infitration to BM, initially treated with velcade, good response and was in remission until 03/2013, restarted on revlimid, velcade, and dex which he did not tolerate revlimid. He last saw Dr. Sherrlyn Hock in early feb where decision to hold velcade. He did mention that he went to ED a few weeks ago after having chest pain after eating, however it resolved. He is now readmitted for chest pain that he describes occuring at rest, substernal nagging,throbbing sensation that radiates to back, occasionally worsening with activity. Found to have slight troponin elevation and EKG changes concerning for NSTEMI relating to atypical chest pain. Being seen by cardiology to determine if will undergo cardiac catherization during hospitalizaiton. Currently getting anticoagualted. Due to feeling poorly he has stopped taking his hiv medications for the past 3-4 days  Past Medical History  Diagnosis Date  . HIV infection dx'd 1990    Viral load undetectable in January with CD4 count of 340  . Hyperlipidemia     Patient denies this hx on 02/04/2015  . Presence of permanent cardiac pacemaker   . TIA (transient ischemic attack) ~ 2005  . Borderline type 2 diabetes mellitus     "Borderline" under surveillance, no medical therapy  . Multiple myeloma dx'd 2011    Allergies: No Known Allergies  MEDICATIONS: . aspirin  81 mg Oral Daily  . clopidogrel  75 mg Oral Daily  .  isosorbide mononitrate  30 mg Oral Daily  . regadenoson      . regadenoson  0.4 mg Intravenous Once    History  Substance Use Topics  . Smoking status: Former Smoker -- 0.50 packs/day for 35 years    Types: Cigarettes    Quit date: 12/06/1997  . Smokeless tobacco: Never Used  . Alcohol Use: No    Family History  Problem Relation Age of Onset  . Stroke Mother   . Cancer Sister   . Diabetes Brother   . Alcohol abuse Brother      Review of Systems  Constitutional: Negative for fever, chills, diaphoresis, activity change, appetite change, fatigue and unexpected weight change.  HENT: Negative for congestion, sore throat, rhinorrhea, sneezing, trouble swallowing and sinus pressure.  Eyes: Negative for photophobia and visual disturbance.  Respiratory: Negative for cough, chest tightness, shortness of breath, wheezing and stridor.  Cardiovascular: positivefor chest pain, palpitations and negative leg swelling.  Gastrointestinal: Negative for nausea, vomiting, abdominal pain, diarrhea, constipation, blood in stool, abdominal distention and anal bleeding.  Genitourinary: Negative for dysuria, hematuria, flank pain and difficulty urinating.  Musculoskeletal: Negative for myalgias, back pain, joint swelling, arthralgias and gait problem.  Skin: Negative for color change, pallor, rash and wound.  Neurological: Negative for dizziness, tremors, weakness and light-headedness.  Hematological: Negative for adenopathy. Does not bruise/bleed easily.  Psychiatric/Behavioral: Negative for behavioral problems, confusion, sleep disturbance, dysphoric mood, decreased concentration and agitation.  OBJECTIVE: Temp:  [97.4 F (36.3 C)-98.1 F (36.7 C)] 98.1 F (36.7 C) (03/01 0648) Pulse Rate:  [88-114] 88 (03/01 0648) Resp:  [14-27] 21 (03/01 0648) BP: (95-116)/(62-88) 95/62 mmHg (03/01 0648) SpO2:  [96 %-100 %] 99 % (03/01 0648) Weight:  [128 lb (58.06 kg)] 128 lb (58.06 kg) (02/29  1621) Physical Exam  Constitutional: He is oriented to person, place, and time. He appears well-developed and well-nourished. No distress.  HENT:  Mouth/Throat: Oropharynx is clear and moist. No oropharyngeal exudate.  Cardiovascular: Normal rate, regular rhythm and normal heart sounds. Exam reveals no gallop and no friction rub.  No murmur heard.  Pulmonary/Chest: Effort normal and breath sounds normal. No respiratory distress. He has no wheezes.  Abdominal: Soft. Bowel sounds are normal. He exhibits no distension. There is no tenderness.  Lymphadenopathy:  He has no cervical adenopathy.  Neurological: He is alert and oriented to person, place, and time.  Skin: Skin is warm and dry. No rash noted. No erythema.  Psychiatric: He has a normal mood and affect. His behavior is normal.     LABS: Results for orders placed or performed during the hospital encounter of 02/03/15 (from the past 48 hour(s))  Brain natriuretic peptide     Status: Abnormal   Collection Time: 02/03/15  5:07 PM  Result Value Ref Range   B Natriuretic Peptide 621.5 (H) 0.0 - 100.0 pg/mL  CBC with Differential     Status: Abnormal   Collection Time: 02/03/15  5:07 PM  Result Value Ref Range   WBC 4.1 4.0 - 10.5 K/uL   RBC 3.70 (L) 4.22 - 5.81 MIL/uL   Hemoglobin 10.9 (L) 13.0 - 17.0 g/dL   HCT 32.4 (L) 39.0 - 52.0 %   MCV 87.6 78.0 - 100.0 fL   MCH 29.5 26.0 - 34.0 pg   MCHC 33.6 30.0 - 36.0 g/dL   RDW 12.8 11.5 - 15.5 %   Platelets 143 (L) 150 - 400 K/uL   Neutrophils Relative % 52 43 - 77 %   Neutro Abs 2.1 1.7 - 7.7 K/uL   Lymphocytes Relative 39 12 - 46 %   Lymphs Abs 1.6 0.7 - 4.0 K/uL   Monocytes Relative 8 3 - 12 %   Monocytes Absolute 0.3 0.1 - 1.0 K/uL   Eosinophils Relative 1 0 - 5 %   Eosinophils Absolute 0.0 0.0 - 0.7 K/uL   Basophils Relative 0 0 - 1 %   Basophils Absolute 0.0 0.0 - 0.1 K/uL  Comprehensive metabolic panel     Status: Abnormal   Collection Time: 02/03/15  5:07 PM  Result  Value Ref Range   Sodium 137 135 - 145 mmol/L   Potassium 3.6 3.5 - 5.1 mmol/L   Chloride 105 96 - 112 mmol/L   CO2 26 19 - 32 mmol/L   Glucose, Bld 110 (H) 70 - 99 mg/dL   BUN 8 6 - 23 mg/dL   Creatinine, Ser 0.87 0.50 - 1.35 mg/dL   Calcium 9.2 8.4 - 10.5 mg/dL   Total Protein 5.4 (L) 6.0 - 8.3 g/dL   Albumin 3.5 3.5 - 5.2 g/dL   AST 62 (H) 0 - 37 U/L   ALT 54 (H) 0 - 53 U/L   Alkaline Phosphatase 67 39 - 117 U/L   Total Bilirubin 0.8 0.3 - 1.2 mg/dL   GFR calc non Af Amer 88 (L) >90 mL/min   GFR calc Af Amer >90 >90 mL/min    Comment: (  NOTE) The eGFR has been calculated using the CKD EPI equation. This calculation has not been validated in all clinical situations. eGFR's persistently <90 mL/min signify possible Chronic Kidney Disease.    Anion gap 6 5 - 15  Lipase, blood     Status: None   Collection Time: 02/03/15  5:07 PM  Result Value Ref Range   Lipase 42 11 - 59 U/L  I-stat troponin, ED     Status: Abnormal   Collection Time: 02/03/15  5:24 PM  Result Value Ref Range   Troponin i, poc 0.25 (HH) 0.00 - 0.08 ng/mL   Comment NOTIFIED PHYSICIAN    Comment 3            Comment: Due to the release kinetics of cTnI, a negative result within the first hours of the onset of symptoms does not rule out myocardial infarction with certainty. If myocardial infarction is still suspected, repeat the test at appropriate intervals.   Protime-INR     Status: Abnormal   Collection Time: 02/03/15  8:20 PM  Result Value Ref Range   Prothrombin Time 16.1 (H) 11.6 - 15.2 seconds   INR 1.28 0.00 - 1.49  Troponin I     Status: Abnormal   Collection Time: 02/03/15  8:20 PM  Result Value Ref Range   Troponin I 0.32 (H) <0.031 ng/mL    Comment:        PERSISTENTLY INCREASED TROPONIN VALUES IN THE RANGE OF 0.04-0.49 ng/mL CAN BE SEEN IN:       -UNSTABLE ANGINA       -CONGESTIVE HEART FAILURE       -MYOCARDITIS       -CHEST TRAUMA       -ARRYHTHMIAS       -LATE PRESENTING  MYOCARDIAL INFARCTION       -COPD   CLINICAL FOLLOW-UP RECOMMENDED.   Troponin I     Status: Abnormal   Collection Time: 02/04/15 12:24 AM  Result Value Ref Range   Troponin I 0.37 (H) <0.031 ng/mL    Comment:        PERSISTENTLY INCREASED TROPONIN VALUES IN THE RANGE OF 0.04-0.49 ng/mL CAN BE SEEN IN:       -UNSTABLE ANGINA       -CONGESTIVE HEART FAILURE       -MYOCARDITIS       -CHEST TRAUMA       -ARRYHTHMIAS       -LATE PRESENTING MYOCARDIAL INFARCTION       -COPD   CLINICAL FOLLOW-UP RECOMMENDED.   Lipid panel     Status: Abnormal   Collection Time: 02/04/15 12:24 AM  Result Value Ref Range   Cholesterol 152 0 - 200 mg/dL   Triglycerides 57 <150 mg/dL   HDL 39 (L) >39 mg/dL   Total CHOL/HDL Ratio 3.9 RATIO   VLDL 11 0 - 40 mg/dL   LDL Cholesterol 102 (H) 0 - 99 mg/dL    Comment:        Total Cholesterol/HDL:CHD Risk Coronary Heart Disease Risk Table                     Men   Women  1/2 Average Risk   3.4   3.3  Average Risk       5.0   4.4  2 X Average Risk   9.6   7.1  3 X Average Risk  23.4   11.0        Use the  calculated Patient Ratio above and the CHD Risk Table to determine the patient's CHD Risk.        ATP III CLASSIFICATION (LDL):  <100     mg/dL   Optimal  100-129  mg/dL   Near or Above                    Optimal  130-159  mg/dL   Borderline  160-189  mg/dL   High  >190     mg/dL   Very High   Troponin I     Status: Abnormal   Collection Time: 02/04/15  6:26 AM  Result Value Ref Range   Troponin I 0.61 (HH) <0.031 ng/mL    Comment:        POSSIBLE MYOCARDIAL ISCHEMIA. SERIAL TESTING RECOMMENDED. CRITICAL RESULT CALLED TO, READ BACK BY AND VERIFIED WITH: YATES J RN 02/04/15 0801 COSTELLO B REPEATED TO VERIFY   CBC     Status: Abnormal   Collection Time: 02/04/15  6:26 AM  Result Value Ref Range   WBC 3.1 (L) 4.0 - 10.5 K/uL   RBC 3.50 (L) 4.22 - 5.81 MIL/uL   Hemoglobin 10.4 (L) 13.0 - 17.0 g/dL   HCT 30.8 (L) 39.0 - 52.0 %   MCV 88.0  78.0 - 100.0 fL   MCH 29.7 26.0 - 34.0 pg   MCHC 33.8 30.0 - 36.0 g/dL   RDW 12.7 11.5 - 15.5 %   Platelets 130 (L) 150 - 400 K/uL  Heparin level (unfractionated)     Status: None   Collection Time: 02/04/15  8:02 AM  Result Value Ref Range   Heparin Unfractionated 0.61 0.30 - 0.70 IU/mL    Comment:        IF HEPARIN RESULTS ARE BELOW EXPECTED VALUES, AND PATIENT DOSAGE HAS BEEN CONFIRMED, SUGGEST FOLLOW UP TESTING OF ANTITHROMBIN III LEVELS.     MICRO:  IMAGING: Dg Chest 2 View  02/03/2015   CLINICAL DATA:  Chronic chest pain for 1 month, with fatigue. Initial encounter.  EXAM: CHEST  2 VIEW  COMPARISON:  Chest radiograph and CTA of the chest performed 12/19/2014  FINDINGS: The lungs are well-aerated and clear. There is no evidence of focal opacification, pleural effusion or pneumothorax. A right-sided chest port is noted ending about the distal SVC.  The heart is normal in size; a pacemaker is noted at the left chest wall, with leads ending at the right atrium and right ventricle. No acute osseous abnormalities are seen. Cervical spinal fusion hardware is partially imaged.  IMPRESSION: No acute cardiopulmonary process seen.   Electronically Signed   By: Garald Balding M.D.   On: 02/03/2015 18:22   Ct Angio Chest Pe W/cm &/or Wo Cm  02/03/2015   CLINICAL DATA:  Short of breath, back pain. Concern for pulmonary embolism. History multiple myeloma  EXAM: CT ANGIOGRAPHY CHEST WITH CONTRAST  TECHNIQUE: Multidetector CT imaging of the chest was performed using the standard protocol during bolus administration of intravenous contrast. Multiplanar CT image reconstructions and MIPs were obtained to evaluate the vascular anatomy.  CONTRAST:  66m OMNIPAQUE IOHEXOL 350 MG/ML SOLN  COMPARISON:  CT 12/19/2014  FINDINGS: Mediastinum/Nodes: No filling defects within the pulmonary arteries to suggest acute pulmonary embolism. No acute findings aorta great vessels. No pericardial fluid. Esophagus is normal.   Lungs/Pleura:  Review of the lung parenchyma demonstrates no pulmonary edema, pleural fluid, or pneumothorax. 4 mm nodule in the right upper lobe on image 41, series 11.  Not changed from comparison exam.  Upper abdomen: Consistent with given history of multiple myeloma. Normal adrenal glands.  Musculoskeletal: Are multiple lytic and sclerotic lesions throughout the sternum and spine. Seventy lesions are expansile.  Review of the MIP images confirms the above findings.  IMPRESSION: 1. No evidence acute pulmonary embolism. 2. No acute or parenchymal findings. 3. Lesions within the spine and sternum consistent with given history of multiple myeloma.   Electronically Signed   By: Suzy Bouchard M.D.   On: 02/03/2015 19:45     Assessment/Plan:  69yo M with HIV disease, multiple myeloma who presents with atypical chest pain found to have ecg changes and slight elevation of troponin elevation concerning for acute coronary syndrome.  ACS/NSTEMI = defer to cardiology, agree with undergoing left heart catherization. Continue on aspirin, beta blocker, plavix, heparin gtt.  hiv disease = continue to hold his HIV regimen until can determine relief of atypical chest pain  Hx of hld = if going on lipid lower agent, studies suggest rosuvastatin better drug in hiv patients  Jamonte Curfman B. [facility] [phone]

## 2015-02-04 NOTE — Progress Notes (Addendum)
CRITICAL VALUE ALERT  Critical value received:  Trop 0.61  Date of notification:  02/04/15  Time of notification:  0800  Critical value read back:Yes.    Nurse who received alert:  Berenice Primas, RN  MD notified (1st page):  Test already in progress, hep gtt infusing  Time of first page:  [phone]: Text page to Dr. Sanjuana Letters for notification purposes   MD notified (2nd page):  Time of second page:  Responding MD:    Time MD responded:

## 2015-02-04 NOTE — Progress Notes (Signed)
ANTICOAGULATION CONSULT NOTE - Initial Consult  Pharmacy Consult for heparin Indication: chest pain/ACS  No Known Allergies  Patient Measurements: Height: _0  (175.3 cm) Weight: 128 lb (58.06 kg) IBW/kg (Calculated) : 70.7 Heparin Dosing Weight: 58 kg  Vital Signs: Temp: 98.1 F (36.7 C) (03/01 0648) Temp Source: Oral (03/01 0648) BP: 95/62 mmHg (03/01 0648) Pulse Rate: 88 (03/01 0648)  Labs:  Recent Labs  02/03/15 1707 02/03/15 2020 02/04/15 0024 02/04/15 0626 02/04/15 0802 02/04/15 1250 02/04/15 1335  HGB 10.9*  --   --  10.4*  --   --  11.5*  HCT 32.4*  --   --  30.8*  --   --  34.4*  PLT 143*  --   --  130*  --   --  154  LABPROT  --  16.1*  --   --   --   --   --   INR  --  1.28  --   --   --   --   --   HEPARINUNFRC  --   --   --   --  0.61 0.73*  --   CREATININE 0.87  --   --   --   --   --  0.79  TROPONINI  --  0.32* 0.37* 0.61*  --   --   --     Estimated Creatinine Clearance: 74.6 mL/min (by C-G formula based on Cr of 0.79).   Medical History: Past Medical History  Diagnosis Date  . HIV infection dx'd 1990    Viral load undetectable in January with CD4 count of 340  . Hyperlipidemia     Patient denies this hx on 02/04/2015  . Presence of permanent cardiac pacemaker   . TIA (transient ischemic attack) ~ 2005  . Borderline type 2 diabetes mellitus     "Borderline" under surveillance, no medical therapy  . Multiple myeloma dx'd 2011    Medications:  Prescriptions prior to admission  Medication Sig Dispense Refill Last Dose  . clopidogrel (PLAVIX) 75 MG tablet Take 75 mg by mouth daily.   02/02/2015 at Unknown time  . darunavir-cobicistat (PREZCOBIX) 800-150 MG per tablet Take 1 tablet by mouth daily. Swallow whole. Do NOT crush, break or chew tablets. Take with food. 30 tablet 11 02/02/2015 at Unknown time  . dexamethasone (DECADRON) 4 MG tablet Take 5 tablets weekly with chemotherapy. 40 tablet 3 Past Week at unknown  . emtricitabine-tenofovir  (TRUVADA) 200-300 MG per tablet Take 1 tablet by mouth daily. 30 tablet 11 02/02/2015 at Unknown time  . HYDROcodone-acetaminophen (NORCO) 10-325 MG per tablet Take 1 tablet by mouth every 6 (six) hours as needed. 30 tablet 0 Past Week at Unknown time  . prochlorperazine (COMPAZINE) 10 MG tablet Take 1 tablet (10 mg total) by mouth every 6 (six) hours as needed for nausea or vomiting. 30 tablet 0 Past Week at Unknown time    Assessment: 69 yo male presents with chest pain. Has a history of HIV and multiple myeloma - for which he is receiving weekly Velcade, last treatment was 2/25. Pharmacy consulted to initiate heparin for rule out ACS. Pt had CT chest in Jan 2016 which ruled out PE. Current hgb 10.9, plts 143.   Trop 0.32 > 0.37 > 0.61  Confirmatory HL is SUPRAtherapeutic at 0.73 on heparin 700 units/hr. No bleeding is noted by nursing.  Goal of Therapy:  Heparin level 0.3-0.7 units/ml Monitor platelets by anticoagulation protocol: Yes   Plan:  Reduce heparin to 600 units/hr 6h HL Daily HL, CBC Monitor for s/sx bleeding  Rick Potter. Diona Foley, PharmD Clinical Pharmacist Pager [phone]  02/04/2015 2:28 PM

## 2015-02-04 NOTE — Progress Notes (Signed)
  Echocardiogram 2D Echocardiogram has been performed.  Diyana Starrett FRANCES 02/04/2015, 3:49 PM

## 2015-02-05 ENCOUNTER — Encounter (HOSPITAL_COMMUNITY): Payer: Self-pay | Admitting: Cardiology

## 2015-02-05 ENCOUNTER — Telehealth: Payer: Self-pay | Admitting: Oncology

## 2015-02-05 DIAGNOSIS — I214 Non-ST elevation (NSTEMI) myocardial infarction: Secondary | ICD-10-CM | POA: Diagnosis not present

## 2015-02-05 DIAGNOSIS — R0789 Other chest pain: Secondary | ICD-10-CM

## 2015-02-05 DIAGNOSIS — E43 Unspecified severe protein-calorie malnutrition: Secondary | ICD-10-CM

## 2015-02-05 DIAGNOSIS — B20 Human immunodeficiency virus [HIV] disease: Secondary | ICD-10-CM | POA: Diagnosis not present

## 2015-02-05 DIAGNOSIS — C9002 Multiple myeloma in relapse: Secondary | ICD-10-CM | POA: Diagnosis not present

## 2015-02-05 DIAGNOSIS — Z950 Presence of cardiac pacemaker: Secondary | ICD-10-CM | POA: Diagnosis not present

## 2015-02-05 LAB — BASIC METABOLIC PANEL
Anion gap: 6 (ref 5–15)
BUN: 7 mg/dL (ref 6–23)
CALCIUM: 9 mg/dL (ref 8.4–10.5)
CO2: 27 mmol/L (ref 19–32)
CREATININE: 0.79 mg/dL (ref 0.50–1.35)
Chloride: 107 mmol/L (ref 96–112)
GFR calc Af Amer: >90 mL/min (ref >90)
GFR calc non Af Amer: >90 mL/min (ref >90)
GLUCOSE: 94 mg/dL (ref 70–99)
Potassium: 3.6 mmol/L (ref 3.5–5.1)
Sodium: 140 mmol/L (ref 135–145)

## 2015-02-05 LAB — CBC
HEMATOCRIT: 31.2 % — AB (ref 39.0–52.0)
Hemoglobin: 10.6 g/dL — ABNORMAL LOW (ref 13.0–17.0)
MCH: 30 pg (ref 26.0–34.0)
MCHC: 34 g/dL (ref 30.0–36.0)
MCV: 88.4 fL (ref 78.0–100.0)
Platelets: 148 10*3/uL — ABNORMAL LOW (ref 150–400)
RBC: 3.53 MIL/uL — ABNORMAL LOW (ref 4.22–5.81)
RDW: 12.7 % (ref 11.5–15.5)
WBC: 3.2 10*3/uL — AB (ref 4.0–10.5)

## 2015-02-05 MED ORDER — SODIUM CHLORIDE 0.9 % IV SOLN
INTRAVENOUS | Status: DC
  Administered 2015-02-05 – 2015-02-06 (×2): via INTRAVENOUS

## 2015-02-05 MED ORDER — IBUPROFEN 400 MG PO TABS
400.0000 mg | ORAL_TABLET | Freq: Three times a day (TID) | ORAL | Status: DC
  Administered 2015-02-05 – 2015-02-09 (×11): 400 mg via ORAL
  Filled 2015-02-05: qty 2
  Filled 2015-02-05 (×14): qty 1
  Filled 2015-02-05: qty 2
  Filled 2015-02-05: qty 1

## 2015-02-05 MED ORDER — MORPHINE SULFATE 2 MG/ML IJ SOLN
2.0000 mg | INTRAMUSCULAR | Status: DC | PRN
  Administered 2015-02-05 – 2015-02-06 (×2): 2 mg via INTRAVENOUS
  Filled 2015-02-05 (×2): qty 1

## 2015-02-05 MED ORDER — SODIUM CHLORIDE 0.9 % IV BOLUS (SEPSIS)
500.0000 mL | Freq: Once | INTRAVENOUS | Status: AC
  Administered 2015-02-05: 500 mL via INTRAVENOUS

## 2015-02-05 MED ORDER — FAMOTIDINE 20 MG PO TABS
20.0000 mg | ORAL_TABLET | Freq: Every day | ORAL | Status: DC
  Administered 2015-02-05 – 2015-02-09 (×5): 20 mg via ORAL
  Filled 2015-02-05 (×6): qty 1

## 2015-02-05 NOTE — Progress Notes (Signed)
UR completed 

## 2015-02-05 NOTE — Telephone Encounter (Signed)
Pt called to cancel labs/chemo due to being hospitalized, sent msg to Dr. Alen Blew, Alfredo Martinez advising of the cancellation and that pt will c/b next week if he can't make his 03/10 apt..... KJ

## 2015-02-05 NOTE — Progress Notes (Signed)
[facility] PROGRESS NOTE  Rick Potter GHW:[id number] DOB: May 06, 1946 DOA: 02/03/2015 PCP: [name], MD  Summary I have seen and examined Rick Potter at bedside and reviewed his chart. Appreciate cardiology. Rick Potter is a pleasant 69 y.o. gentleman with Multiple Myeloma (currently on Velcade and decadron, administered once weekly on Thursdays) and HIV (well-controlled, last viral load in January undetectable) who presented with chest pain concerning for acute coronary syndrome and he has ruled in for an NSTEMI- TROPONIN 0.61. Patient had CT chest which showed "1. No evidence acute pulmonary embolism. 2. No acute or parenchymal findings. 3. Lesions within the spine and sternum consistent with given history of multiple myeloma". I have spoken with Dr. Radford Pax who plans cardiac cath this morning. Patient reports that the pain is persistent. He has not taken HIV meds for 3-4 days because of not feeling well. Dr. Baxter Flattery will graciously see patient in consult. Will follow cardiology and ID recommendations. Keep patient nothing by mouth.   Plan Chest pain: possible pericarditis.  Patient underwent Cath: which showed Mild Nonobstructive CAD.  Evaluated by cardiology, recommendation is to treat with NSAID for pericarditis. Will start Ibuprofen, and Pepcid.  On aspirin/Plavix//NTG CT angio negative for PE.  2-D echo with normal Ef.   Hypotension; will give IV bolus. IV fluids. Need to be careful with opioids.   Mild elevated troponin:  Thought to be secondary to pericarditis. Cath with mild nonobstructive CAD,   HIV disease ID to see regarding resuming regular meds. Hold meds for now,.   Multiple myeloma in relapse No acute changes Will defer to oncology once patient discharged  Code Status: Full Family Communication: No family at bedside Disposition Plan: Eventually  home   Consultants:  Cardiology  ID  Procedures:  None  Antibiotics:  None  HPI/Subjective: Still with chest pain, denies dyspnea. Needs something for pain.   Objective: Filed Vitals:   02/05/15 1201  BP: 96/68  Pulse: 101  Temp: 97.6 F (36.4 C)  Resp: 18    Intake/Output Summary (Last 24 hours) at 02/05/15 1455 Last data filed at 02/05/15 1100  Gross per 24 hour  Intake    240 ml  Output   1175 ml  Net   -935 ml   Filed Weights   02/03/15 1621  Weight: 58.06 kg (128 lb)    Exam:   General: NAD.   Cardiovascular: S1-S2 normal. No murmurs. Pulse regular.  Respiratory: Good air entry bilaterally. No rhonchi or rales.  Abdomen: Soft and nontender. Normal bowel sounds. No organomegaly.  Musculoskeletal: No pedal edema   Neurological: Intact  Data Reviewed: Basic Metabolic Panel:  Recent Labs Lab 02/03/15 1707 02/04/15 1335 02/05/15 0500  NA 137 139 140  K 3.6 3.7 3.6  CL 105 106 107  CO2 26 24 27   GLUCOSE 110* 92 94  BUN 8 6 7   CREATININE 0.87 0.79 0.79  CALCIUM 9.2 9.2 9.0   Liver Function Tests:  Recent Labs Lab 02/03/15 1707  AST 62*  ALT 54*  ALKPHOS 67  BILITOT 0.8  PROT 5.4*  ALBUMIN 3.5    Recent Labs Lab 02/03/15 1707  LIPASE 42   No results for input(s): AMMONIA in the last 168 hours. CBC:  Recent Labs Lab 02/03/15 1707 02/04/15 0626 02/04/15 1335 02/05/15 0500  WBC 4.1 3.1* 3.9* 3.2*  NEUTROABS 2.1  --   --   --   HGB 10.9* 10.4* 11.5* 10.6*  HCT 32.4* 30.8* 34.4* 31.2*  MCV 87.6 88.0 88.9 88.4  PLT 143* 130* 154 148*   Cardiac Enzymes:  Recent Labs Lab 02/03/15 2020 02/04/15 0024 02/04/15 0626  TROPONINI 0.32* 0.37* 0.61*   BNP (last 3 results)  Recent Labs  12/19/14 1633 02/03/15 1707  BNP 486.7* 621.5*    ProBNP (last 3 results) No results for input(s): PROBNP in the last 8760 hours.  CBG: No results for input(s): GLUCAP in the last 168 hours.  No results found for this or any  previous visit (from the past 240 hour(s)).   Studies: Dg Chest 2 View  02/03/2015   CLINICAL DATA:  Chronic chest pain for 1 month, with fatigue. Initial encounter.  EXAM: CHEST  2 VIEW  COMPARISON:  Chest radiograph and CTA of the chest performed 12/19/2014  FINDINGS: The lungs are well-aerated and clear. There is no evidence of focal opacification, pleural effusion or pneumothorax. A right-sided chest port is noted ending about the distal SVC.  The heart is normal in size; a pacemaker is noted at the left chest wall, with leads ending at the right atrium and right ventricle. No acute osseous abnormalities are seen. Cervical spinal fusion hardware is partially imaged.  IMPRESSION: No acute cardiopulmonary process seen.   Electronically Signed   By: Garald Balding M.D.   On: 02/03/2015 18:22   Ct Angio Chest Pe W/cm &/or Wo Cm  02/03/2015   CLINICAL DATA:  Short of breath, back pain. Concern for pulmonary embolism. History multiple myeloma  EXAM: CT ANGIOGRAPHY CHEST WITH CONTRAST  TECHNIQUE: Multidetector CT imaging of the chest was performed using the standard protocol during bolus administration of intravenous contrast. Multiplanar CT image reconstructions and MIPs were obtained to evaluate the vascular anatomy.  CONTRAST:  74m OMNIPAQUE IOHEXOL 350 MG/ML SOLN  COMPARISON:  CT 12/19/2014  FINDINGS: Mediastinum/Nodes: No filling defects within the pulmonary arteries to suggest acute pulmonary embolism. No acute findings aorta great vessels. No pericardial fluid. Esophagus is normal.  Lungs/Pleura:  Review of the lung parenchyma demonstrates no pulmonary edema, pleural fluid, or pneumothorax. 4 mm nodule in the right upper lobe on image 41, series 11. Not changed from comparison exam.  Upper abdomen: Consistent with given history of multiple myeloma. Normal adrenal glands.  Musculoskeletal: Are multiple lytic and sclerotic lesions throughout the sternum and spine. Seventy lesions are expansile.  Review of  the MIP images confirms the above findings.  IMPRESSION: 1. No evidence acute pulmonary embolism. 2. No acute or parenchymal findings. 3. Lesions within the spine and sternum consistent with given history of multiple myeloma.   Electronically Signed   By: SSuzy BouchardM.D.   On: 02/03/2015 19:45   Nm Myocar Single W/spect W/wall Motion And Ef  02/04/2015   CLINICAL DATA:  69year old with multiple myeloma, lytic sternal lesions with elevated troponin, abnormal EKG, chest pain.  EXAM: MYOCARDIAL IMAGING WITH SPECT (REST AND PHARMACOLOGIC-STRESS)  GATED LEFT VENTRICULAR WALL MOTION STUDY  LEFT VENTRICULAR EJECTION FRACTION  TECHNIQUE: Standard myocardial SPECT imaging was performed after resting intravenous injection of 10 mCi Tc-978mestamibi. After rest imaging, stress portion was terminated and patient was brought to the cardiac catheterization lab by Dr. TuRadford Pax COMPARISON:  None.  FINDINGS: Perfusion: There appears to be normal perfusion uptake along the lateral wall distribution however other wall segments appear diffusely mildly reduced. There are no stress images to compare.  Wall Motion:  Not performed.  Left Ventricular Ejection Fraction: n/a %  End diastolic volume n/a ml  End systolic volume n/a ml  IMPRESSION: 1.  Abnormal rest images with mild reduced radiotracer uptake in all segments other than lateral wall.  2. No stress images performed. Test was not completed, patient proceeded to cardiac catheterization.   Electronically Signed   By: Candee Furbish   On: 02/04/2015 14:27    Scheduled Meds: . aspirin  81 mg Oral Daily  . clopidogrel  75 mg Oral Daily  . famotidine  20 mg Oral Daily  . feeding supplement (RESOURCE BREEZE)  1 Container Oral TID BM  . ibuprofen  400 mg Oral TID   Continuous Infusions:     Time spent: 25 minutes    Noland Pizano, [facility] Pager [phone]. If 7PM-7AM, please contact night-coverage at [email or web address], password [facility] 02/05/2015, 2:55 PM   LOS: 2 days

## 2015-02-05 NOTE — Progress Notes (Signed)
[facility]    Date of Admission:  02/03/2015   Total days of antibiotics 0   ID: Rick Potter is a 69 y.o. male with hx of HIV and multiple myeloma, hx of DDD placemaker who presented with atypical chest pain, mild troponin elevation and ecg changes. He was treated for NSTEMI, underwent heart catherization Active Problems:   HIV disease   Multiple myeloma in relapse   Pacemaker   NSTEMI (non-ST elevated myocardial infarction)   Chest pain   Angina pectoris   Elevated troponin   Protein-calorie malnutrition, severe    Subjective: Underwent left heart cath yesterday with mild non-obstructive CAD 30-40% Dx1, 20% LAD, 10-20% RCA. 2D echo with trivial posterior pericardial effusion. still complains of chest pain at rest, possibly worsened after eating  Medications:  . aspirin  81 mg Oral Daily  . clopidogrel  75 mg Oral Daily  . famotidine  20 mg Oral Daily  . feeding supplement (RESOURCE BREEZE)  1 Container Oral TID BM  . ibuprofen  400 mg Oral TID    Objective: Vital signs in last 24 hours: Temp:  [97.6 F (36.4 C)-98.1 F (36.7 C)] 97.7 F (36.5 C) (03/02 0448) Pulse Rate:  [80-109] 95 (03/02 1000) Resp:  [16-20] 20 (03/02 1000) BP: (87-121)/(60-81) 87/61 mmHg (03/02 1000) SpO2:  [90 %-100 %] 100 % (03/02 1000)  Physical Exam  Constitutional: He is oriented to person, place, and time. He appears well-developed and well-nourished. No distress.  HENT:  Mouth/Throat: Oropharynx is clear and moist. No oropharyngeal exudate.  Cardiovascular: Normal rate, regular rhythm and normal heart sounds. Exam reveals no gallop and no friction rub.  No murmur heard.  Pulmonary/Chest: Effort normal and breath sounds normal. No respiratory distress. He has no wheezes.  Abdominal: Soft. Bowel sounds are normal. He exhibits no distension. There is no tenderness.  Lymphadenopathy:  He has no cervical adenopathy.  Neurological: He is alert and oriented to  person, place, and time.  Skin: Skin is warm and dry. No rash noted. No erythema.  Psychiatric: He has a normal mood and affect. His behavior is normal.     Lab Results  Recent Labs  02/04/15 1335 02/05/15 0500  WBC 3.9* 3.2*  HGB 11.5* 10.6*  HCT 34.4* 31.2*  NA 139 140  K 3.7 3.6  CL 106 107  CO2 24 27  BUN 6 7  CREATININE 0.79 0.79   Liver Panel  Recent Labs  02/03/15 1707  PROT 5.4*  ALBUMIN 3.5  AST 62*  ALT 54*  ALKPHOS 67  BILITOT 0.8   Sedimentation Rate No results for input(s): ESRSEDRATE in the last 72 hours. C-Reactive Protein No results for input(s): CRP in the last 72 hours.  Microbiology:  Studies/Results: Dg Chest 2 View  02/03/2015   CLINICAL DATA:  Chronic chest pain for 1 month, with fatigue. Initial encounter.  EXAM: CHEST  2 VIEW  COMPARISON:  Chest radiograph and CTA of the chest performed 12/19/2014  FINDINGS: The lungs are well-aerated and clear. There is no evidence of focal opacification, pleural effusion or pneumothorax. A right-sided chest port is noted ending about the distal SVC.  The heart is normal in size; a pacemaker is noted at the left chest wall, with leads ending at the right atrium and right ventricle. No acute osseous abnormalities are seen. Cervical spinal fusion hardware is partially imaged.  IMPRESSION: No acute cardiopulmonary process seen.   Electronically Signed   By: Jacqulynn Cadet  Chang M.D.   On: 02/03/2015 18:22   Ct Angio Chest Pe W/cm &/or Wo Cm  02/03/2015   CLINICAL DATA:  Short of breath, back pain. Concern for pulmonary embolism. History multiple myeloma  EXAM: CT ANGIOGRAPHY CHEST WITH CONTRAST  TECHNIQUE: Multidetector CT imaging of the chest was performed using the standard protocol during bolus administration of intravenous contrast. Multiplanar CT image reconstructions and MIPs were obtained to evaluate the vascular anatomy.  CONTRAST:  78m OMNIPAQUE IOHEXOL 350 MG/ML SOLN  COMPARISON:  CT 12/19/2014  FINDINGS:  Mediastinum/Nodes: No filling defects within the pulmonary arteries to suggest acute pulmonary embolism. No acute findings aorta great vessels. No pericardial fluid. Esophagus is normal.  Lungs/Pleura:  Review of the lung parenchyma demonstrates no pulmonary edema, pleural fluid, or pneumothorax. 4 mm nodule in the right upper lobe on image 41, series 11. Not changed from comparison exam.  Upper abdomen: Consistent with given history of multiple myeloma. Normal adrenal glands.  Musculoskeletal: Are multiple lytic and sclerotic lesions throughout the sternum and spine. Seventy lesions are expansile.  Review of the MIP images confirms the above findings.  IMPRESSION: 1. No evidence acute pulmonary embolism. 2. No acute or parenchymal findings. 3. Lesions within the spine and sternum consistent with given history of multiple myeloma.   Electronically Signed   By: SSuzy BouchardM.D.   On: 02/03/2015 19:45   Nm Myocar Single W/spect W/wall Motion And Ef  02/04/2015   CLINICAL DATA:  69year old with multiple myeloma, lytic sternal lesions with elevated troponin, abnormal EKG, chest pain.  EXAM: MYOCARDIAL IMAGING WITH SPECT (REST AND PHARMACOLOGIC-STRESS)  GATED LEFT VENTRICULAR WALL MOTION STUDY  LEFT VENTRICULAR EJECTION FRACTION  TECHNIQUE: Standard myocardial SPECT imaging was performed after resting intravenous injection of 10 mCi Tc-980mestamibi. After rest imaging, stress portion was terminated and patient was brought to the cardiac catheterization lab by Dr. TuRadford Pax COMPARISON:  None.  FINDINGS: Perfusion: There appears to be normal perfusion uptake along the lateral wall distribution however other wall segments appear diffusely mildly reduced. There are no stress images to compare.  Wall Motion:  Not performed.  Left Ventricular Ejection Fraction: n/a %  End diastolic volume n/a ml  End systolic volume n/a ml  IMPRESSION: 1. Abnormal rest images with mild reduced radiotracer uptake in all segments other  than lateral wall.  2. No stress images performed. Test was not completed, patient proceeded to cardiac catheterization.   Electronically Signed   By: MaCandee Furbish On: 02/04/2015 14:27     Assessment/Plan: Atypical chest pain = underwent cath that did not show obstruction to cause his CP. Suspect he may have GI component to it. Recommend to have GI consultation to see if he would benefit endoscopic eval vs. Med mgmt  hiv disease = still hold on his ART while we have better management of chest pain    Terrionna Bridwell, CYSpectrum Health Ludington Hospitalor Infectious Diseases Cell: [phone]: [phone]  02/05/2015, 11:04 AM

## 2015-02-05 NOTE — Progress Notes (Signed)
SUBJECTIVE:  No complaints  OBJECTIVE:   Vitals:   Filed Vitals:   02/04/15 2301 02/04/15 2331 02/04/15 2337 02/05/15 0448  BP: $Re'95/71 91/63 92/66 'EIF$ 96/64  Pulse: 97 94 92 80  Temp:    97.7 F (36.5 C)  TempSrc:    Oral  Resp:    18  Height:      Weight:      SpO2: 100% 100% 100% 100%   I&O's:   Intake/Output Summary (Last 24 hours) at 02/05/15 8144 Last data filed at 02/05/15 0448  Gross per 24 hour  Intake      0 ml  Output    575 ml  Net   -575 ml   TELEMETRY: Reviewed telemetry pt in NSR:     PHYSICAL EXAM General: Well developed, well nourished, in no acute distress Head: Eyes PERRLA, No xanthomas.   Normal cephalic and atramatic  Lungs:   Clear bilaterally to auscultation and percussion. Heart:   HRRR S1 S2 Pulses are 2+ & equal. Abdomen: Bowel sounds are positive, abdomen soft and non-tender without masses  Extremities:   No clubbing, cyanosis or edema.  DP +1 Neuro: Alert and oriented X 3. Psych:  Good affect, responds appropriately   LABS: Basic Metabolic Panel:  Recent Labs  02/04/15 1335 02/05/15 0500  NA 139 140  K 3.7 3.6  CL 106 107  CO2 24 27  GLUCOSE 92 94  BUN 6 7  CREATININE 0.79 0.79  CALCIUM 9.2 9.0   Liver Function Tests:  Recent Labs  02/03/15 1707  AST 62*  ALT 54*  ALKPHOS 67  BILITOT 0.8  PROT 5.4*  ALBUMIN 3.5    Recent Labs  02/03/15 1707  LIPASE 42   CBC:  Recent Labs  02/03/15 1707  02/04/15 1335 02/05/15 0500  WBC 4.1  < > 3.9* 3.2*  NEUTROABS 2.1  --   --   --   HGB 10.9*  < > 11.5* 10.6*  HCT 32.4*  < > 34.4* 31.2*  MCV 87.6  < > 88.9 88.4  PLT 143*  < > 154 148*  < > = values in this interval not displayed. Cardiac Enzymes:  Recent Labs  02/03/15 2020 02/04/15 0024 02/04/15 0626  TROPONINI 0.32* 0.37* 0.61*   BNP: Invalid input(s): POCBNP D-Dimer: No results for input(s): DDIMER in the last 72 hours. Hemoglobin A1C: No results for input(s): HGBA1C in the last 72 hours. Fasting  Lipid Panel:  Recent Labs  02/04/15 0024  CHOL 152  HDL 39*  LDLCALC 102*  TRIG 57  CHOLHDL 3.9   Thyroid Function Tests: No results for input(s): TSH, T4TOTAL, T3FREE, THYROIDAB in the last 72 hours.  Invalid input(s): FREET3 Anemia Panel: No results for input(s): VITAMINB12, FOLATE, FERRITIN, TIBC, IRON, RETICCTPCT in the last 72 hours. Coag Panel:   Lab Results  Component Value Date   INR 1.29 02/04/2015   INR 1.28 02/03/2015    RADIOLOGY: Dg Chest 2 View  02/03/2015   CLINICAL DATA:  Chronic chest pain for 1 month, with fatigue. Initial encounter.  EXAM: CHEST  2 VIEW  COMPARISON:  Chest radiograph and CTA of the chest performed 12/19/2014  FINDINGS: The lungs are well-aerated and clear. There is no evidence of focal opacification, pleural effusion or pneumothorax. A right-sided chest port is noted ending about the distal SVC.  The heart is normal in size; a pacemaker is noted at the left chest wall, with leads ending at the right atrium and  right ventricle. No acute osseous abnormalities are seen. Cervical spinal fusion hardware is partially imaged.  IMPRESSION: No acute cardiopulmonary process seen.   Electronically Signed   By: Garald Balding M.D.   On: 02/03/2015 18:22   Ct Angio Chest Pe W/cm &/or Wo Cm  02/03/2015   CLINICAL DATA:  Short of breath, back pain. Concern for pulmonary embolism. History multiple myeloma  EXAM: CT ANGIOGRAPHY CHEST WITH CONTRAST  TECHNIQUE: Multidetector CT imaging of the chest was performed using the standard protocol during bolus administration of intravenous contrast. Multiplanar CT image reconstructions and MIPs were obtained to evaluate the vascular anatomy.  CONTRAST:  79mL OMNIPAQUE IOHEXOL 350 MG/ML SOLN  COMPARISON:  CT 12/19/2014  FINDINGS: Mediastinum/Nodes: No filling defects within the pulmonary arteries to suggest acute pulmonary embolism. No acute findings aorta great vessels. No pericardial fluid. Esophagus is normal.  Lungs/Pleura:   Review of the lung parenchyma demonstrates no pulmonary edema, pleural fluid, or pneumothorax. 4 mm nodule in the right upper lobe on image 41, series 11. Not changed from comparison exam.  Upper abdomen: Consistent with given history of multiple myeloma. Normal adrenal glands.  Musculoskeletal: Are multiple lytic and sclerotic lesions throughout the sternum and spine. Seventy lesions are expansile.  Review of the MIP images confirms the above findings.  IMPRESSION: 1. No evidence acute pulmonary embolism. 2. No acute or parenchymal findings. 3. Lesions within the spine and sternum consistent with given history of multiple myeloma.   Electronically Signed   By: Suzy Bouchard M.D.   On: 02/03/2015 19:45   Nm Myocar Single W/spect W/wall Motion And Ef  02/04/2015   CLINICAL DATA:  69 year old with multiple myeloma, lytic sternal lesions with elevated troponin, abnormal EKG, chest pain.  EXAM: MYOCARDIAL IMAGING WITH SPECT (REST AND PHARMACOLOGIC-STRESS)  GATED LEFT VENTRICULAR WALL MOTION STUDY  LEFT VENTRICULAR EJECTION FRACTION  TECHNIQUE: Standard myocardial SPECT imaging was performed after resting intravenous injection of 10 mCi Tc-60m sestamibi. After rest imaging, stress portion was terminated and patient was brought to the cardiac catheterization lab by Dr. Radford Pax.  COMPARISON:  None.  FINDINGS: Perfusion: There appears to be normal perfusion uptake along the lateral wall distribution however other wall segments appear diffusely mildly reduced. There are no stress images to compare.  Wall Motion:  Not performed.  Left Ventricular Ejection Fraction: n/a %  End diastolic volume n/a ml  End systolic volume n/a ml  IMPRESSION: 1. Abnormal rest images with mild reduced radiotracer uptake in all segments other than lateral wall.  2. No stress images performed. Test was not completed, patient proceeded to cardiac catheterization.   Electronically Signed   By: Candee Furbish   On: 02/04/2015 14:27    ASSESSMENT: 1. Six-week history of chest discomfort, poorly characterized. Chest CT negative for acute PE. He does have bony lytic lesions in his sternum from multiple myeloma which could be etiology of CP but would not explain troponin elevation and abnormal EKG. Cath yesterday with mild nonobstructive CAD 30-40% D1, 20% LAD, 10-20% RCA.  2D echo with trivial posterior pericardial effusion.   2. 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 Multiple myeloma, on chemotherapy 4. HIV infection 5. History of transient ischemic attack 6. History of DDD pacemaker, with no evidence of pacing on admitting EKG 7. Hyperlipidemia  PLAN: No further w/u at this time.  Could treat for possible pericarditis with NSAIDS for 10 days given pericardial effusion although unlikely etiology.  Will sign off.  Call  with any question.   Sueanne Margarita, MD  02/05/2015  9:28 AM

## 2015-02-06 ENCOUNTER — Encounter (HOSPITAL_COMMUNITY): Payer: Self-pay | Admitting: Physician Assistant

## 2015-02-06 ENCOUNTER — Other Ambulatory Visit: Payer: Medicare Other

## 2015-02-06 ENCOUNTER — Ambulatory Visit: Payer: Medicare Other

## 2015-02-06 LAB — CBC
HCT: 30 % — ABNORMAL LOW (ref 39.0–52.0)
HEMOGLOBIN: 10.1 g/dL — AB (ref 13.0–17.0)
MCH: 29.2 pg (ref 26.0–34.0)
MCHC: 33.7 g/dL (ref 30.0–36.0)
MCV: 86.7 fL (ref 78.0–100.0)
Platelets: 133 10*3/uL — ABNORMAL LOW (ref 150–400)
RBC: 3.46 MIL/uL — ABNORMAL LOW (ref 4.22–5.81)
RDW: 12.6 % (ref 11.5–15.5)
WBC: 2.8 10*3/uL — ABNORMAL LOW (ref 4.0–10.5)

## 2015-02-06 NOTE — Progress Notes (Signed)
UR completed 

## 2015-02-06 NOTE — Progress Notes (Signed)
[facility] PROGRESS NOTE  Rick Potter PJA:[id number] DOB: June 03, 1946 DOA: 02/03/2015 PCP: [name], MD  Summary I have seen and examined Rick Potter at bedside and reviewed his chart. Appreciate cardiology. Rick Potter is a pleasant 69 y.o. gentleman with Multiple Myeloma (currently on Velcade and decadron, administered once weekly on Thursdays) and HIV (well-controlled, last viral load in January undetectable) who presented with chest pain concerning for acute coronary syndrome and he has ruled in for an NSTEMI- TROPONIN 0.61. Patient had CT chest which showed "1. No evidence acute pulmonary embolism. 2. No acute or parenchymal findings. 3. Lesions within the spine and sternum consistent with given history of multiple myeloma". I have spoken with Dr. Radford Pax who plans cardiac cath this morning. Patient reports that the pain is persistent. He has not taken HIV meds for 3-4 days because of not feeling well. Dr. Baxter Flattery will graciously see patient in consult. Will follow cardiology and ID recommendations. Keep patient nothing by mouth.   Plan Chest pain: possible pericarditis.  Patient underwent Cath: which showed Mild Nonobstructive CAD.  Evaluated by cardiology, recommendation is to treat with NSAID for pericarditis. Will start Ibuprofen, and Pepcid.  On aspirin/Plavix//NTG CT angio negative for PE.  2-D echo with normal Ef.  Pain worse after meals. GI consulted for further evaluation.   Hypotension; Received  IV bolus. Continue with  IV fluids. Need to be careful with opioids.   Mild elevated troponin:  Thought to be secondary to pericarditis. Cath with mild nonobstructive CAD,   HIV disease Hold meds for now, as recommended by ID.   Multiple myeloma in relapse No acute changes Will defer to oncology once patient discharged  Code Status: Full Family Communication: No family at bedside Disposition Plan: Eventually  home   Consultants:  Cardiology  ID  Procedures:  None  Antibiotics:  None  HPI/Subjective: Chest pain after eating now.  Pain slightly;y better./  Objective: Filed Vitals:   02/06/15 0433  BP: 97/67  Pulse: 83  Temp: 97.9 F (36.6 C)  Resp: 18    Intake/Output Summary (Last 24 hours) at 02/06/15 0930 Last data filed at 02/06/15 0730  Gross per 24 hour  Intake    240 ml  Output   1875 ml  Net  -1635 ml   Filed Weights   02/03/15 1621  Weight: 58.06 kg (128 lb)    Exam:   General: NAD.   Cardiovascular: S1-S2 normal. No murmurs. Pulse regular.  Respiratory: Good air entry bilaterally. No rhonchi or rales.  Abdomen: Soft and nontender. Normal bowel sounds. No organomegaly.  Musculoskeletal: No pedal edema   Neurological: Intact  Data Reviewed: Basic Metabolic Panel:  Recent Labs Lab 02/03/15 1707 02/04/15 1335 02/05/15 0500  NA 137 139 140  K 3.6 3.7 3.6  CL 105 106 107  CO2 26 24 27   GLUCOSE 110* 92 94  BUN 8 6 7   CREATININE 0.87 0.79 0.79  CALCIUM 9.2 9.2 9.0   Liver Function Tests:  Recent Labs Lab 02/03/15 1707  AST 62*  ALT 54*  ALKPHOS 67  BILITOT 0.8  PROT 5.4*  ALBUMIN 3.5    Recent Labs Lab 02/03/15 1707  LIPASE 42   No results for input(s): AMMONIA in the last 168 hours. CBC:  Recent Labs Lab 02/03/15 1707 02/04/15 0626 02/04/15 1335 02/05/15 0500 02/06/15 0403  WBC 4.1 3.1* 3.9* 3.2* 2.8*  NEUTROABS 2.1  --   --   --   --   HGB 10.9* 10.4* 11.5*  10.6* 10.1*  HCT 32.4* 30.8* 34.4* 31.2* 30.0*  MCV 87.6 88.0 88.9 88.4 86.7  PLT 143* 130* 154 148* 133*   Cardiac Enzymes:  Recent Labs Lab 02/03/15 2020 02/04/15 0024 02/04/15 0626  TROPONINI 0.32* 0.37* 0.61*   BNP (last 3 results)  Recent Labs  12/19/14 1633 02/03/15 1707  BNP 486.7* 621.5*    ProBNP (last 3 results) No results for input(s): PROBNP in the last 8760 hours.  CBG: No results for input(s): GLUCAP in the last 168  hours.  No results found for this or any previous visit (from the past 240 hour(s)).   Studies: Nm Myocar Single W/spect W/wall Motion And Ef  02/04/2015   CLINICAL DATA:  69 year old with multiple myeloma, lytic sternal lesions with elevated troponin, abnormal EKG, chest pain.  EXAM: MYOCARDIAL IMAGING WITH SPECT (REST AND PHARMACOLOGIC-STRESS)  GATED LEFT VENTRICULAR WALL MOTION STUDY  LEFT VENTRICULAR EJECTION FRACTION  TECHNIQUE: Standard myocardial SPECT imaging was performed after resting intravenous injection of 10 mCi Tc-51msestamibi. After rest imaging, stress portion was terminated and patient was brought to the cardiac catheterization lab by Dr. TRadford Pax  COMPARISON:  None.  FINDINGS: Perfusion: There appears to be normal perfusion uptake along the lateral wall distribution however other wall segments appear diffusely mildly reduced. There are no stress images to compare.  Wall Motion:  Not performed.  Left Ventricular Ejection Fraction: n/a %  End diastolic volume n/a ml  End systolic volume n/a ml  IMPRESSION: 1. Abnormal rest images with mild reduced radiotracer uptake in all segments other than lateral wall.  2. No stress images performed. Test was not completed, patient proceeded to cardiac catheterization.   Electronically Signed   By: MCandee Furbish  On: 02/04/2015 14:27    Scheduled Meds: . aspirin  81 mg Oral Daily  . clopidogrel  75 mg Oral Daily  . famotidine  20 mg Oral Daily  . feeding supplement (RESOURCE BREEZE)  1 Container Oral TID BM  . ibuprofen  400 mg Oral TID   Continuous Infusions: . sodium chloride 100 mL/hr at 02/05/15 1625     Time spent: 25 minutes    Rick Potter, [facility] Pager [phone] If 7PM-7AM, please contact night-coverage at [email or web address], password [email or web address]3/02/2015, 9:30 AM  LOS: 3 days

## 2015-02-06 NOTE — Consult Note (Signed)
[facility] Consult: 10:41 AM 02/06/2015  LOS: 3 days    Referring Provider: Dr Tyrell Antonio  Primary Care Physician:  [name], MD Primary Gastroenterologist:  Althia Forts ID:  Dr Graylon Good Onc: Dr Alen Blew    Reason for Consultation:  Non cardiac chest pain.    HPI: Rick Potter is a 69 y.o. male.  Hx HIV on meds, myltiple myeloma on decadron and chemotherapy with Velcade, s/p cardiac pacemaker. On Plavix which is not on hold.  S/p cervical laminectomy. Previously resided in [place] but moved to [place] within the last 10 months.    For the last couple of months patient has had progressive midsternal pain. This is not exertional. For several weeks it was postprandial, lasting 5-7 minutes.. It did not respond to PPI which he took for at least one month. Percocet helps a little.  There was no associated nausea, dysphagia or odynophagia.  Sometimes the pain radiates into his cervical spine.  The pain progressed to nocturnal symptoms. There seemed to be some relief if he would get up and walk around.   There has been weight loss of 20 pounds in the last 6 weeks and he confirms anorexia and limited by mouth intake. Patient has never undergone EGD or colonoscopy. He has daily, brown bowel movements. He has never seen blood or melena in the stool.  In the day prior to admission the patient developed severe, unrelenting chest pain associated with weakness and dyspnea (however patient has history of chronic, stable DOE).   Patient ruled in for non-STEMI .  Cardiac catheterization 02/04/15 showed mild, nonobstructive CAD.  Pertinent review of systems is the fact that the patient was restarted on the Decadron and Velcade about 4 weeks ago. He had been on this medication up until about 6 months ago.  The chest pain  symptoms began prior to reinitiating these medications.      Past Medical History  Diagnosis Date  . HIV infection dx'd 1990    Viral load undetectable in January with CD4 count of 340  . Hyperlipidemia     Patient denies this hx on 02/04/2015  . Presence of permanent cardiac pacemaker   . TIA (transient ischemic attack) ~ 2005  . Borderline type 2 diabetes mellitus     "Borderline" under surveillance, no medical therapy  . Multiple myeloma dx'd 2011    Past Surgical History  Procedure Laterality Date  . Cervical laminectomy  ~ 2012  . Insert / replace / remove pacemaker  ~ 2003  . Inguinal hernia repair Bilateral ~ 1999  . Hernia repair  ~ 1999    UHR  . Umbilical hernia repair    . Mediport insertion, single Right ~ 2012  . Left heart catheterization with coronary angiogram N/A 02/04/2015    Procedure: LEFT HEART CATHETERIZATION WITH CORONARY ANGIOGRAM;  Surgeon: Peter M Martinique, MD;  [facility] C[facility] LAB;  Service: Cardiovascular;  Laterality: N/A;    Prior to Admission medications   Medication Sig Start Date End Date Taking? Authorizing Provider  clopidogrel (PLAVIX) 75 MG tablet Take 75  mg by mouth daily.   Yes Historical Provider, MD  darunavir-cobicistat (PREZCOBIX) 800-150 MG per tablet Take 1 tablet by mouth daily. Swallow whole. Do NOT crush, break or chew tablets. Take with food. 12/09/14  Yes Carlyle Basques, MD  dexamethasone (DECADRON) 4 MG tablet Take 5 tablets weekly with chemotherapy. 12/24/14  Yes Wyatt Portela, MD  emtricitabine-tenofovir (TRUVADA) 200-300 MG per tablet Take 1 tablet by mouth daily. 09/02/14  Yes Carlyle Basques, MD  HYDROcodone-acetaminophen [facility]) 10-325 MG per tablet Take 1 tablet by mouth every 6 (six) hours as needed. 01/29/15  Yes Wyatt Portela, MD  prochlorperazine (COMPAZINE) 10 MG tablet Take 1 tablet (10 mg total) by mouth every 6 (six) hours as needed for nausea or vomiting. 12/31/14  Yes Wyatt Portela, MD    Scheduled Meds: . aspirin   81 mg Oral Daily  . clopidogrel  75 mg Oral Daily  . famotidine  20 mg Oral Daily  . feeding supplement (RESOURCE BREEZE)  1 Container Oral TID BM  . ibuprofen  400 mg Oral TID   Infusions: . sodium chloride 100 mL/hr at 02/05/15 1625   PRN Meds: acetaminophen, gi cocktail, HYDROcodone-acetaminophen, morphine injection, nitroGLYCERIN, ondansetron (ZOFRAN) IV   Allergies as of 02/03/2015  . (No Known Allergies)    Family History  Problem Relation Age of Onset  . Stroke Mother   . Cancer Sister   . Diabetes Brother   . Alcohol abuse Brother     History   Social History  . Marital Status: Single    Spouse Name: N/A  . Number of Children: N/A  . Years of Education: N/A   Occupational History  . 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 Smoking status: Former Smoker -- 0.50 packs/day for 35 years    Types: Cigarettes    Quit date: 12/06/1997  . Smokeless tobacco: Never Used  . Alcohol Use: No  . Drug Use: Yes    Special: Marijuana     Comment: "stopped smoking recreational marijuana ~ 2005"  . Sexual Activity: No     Comment: declined condoms   Other Topics Concern  . Not on file   Social History Narrative   He is not married.  He does not have any children.  Has been in [place] for about 10 months.  Relocated from [place].    REVIEW OF SYSTEMS: Constitutional:  Per HPI ENT:  No nose bleeds Pulm:  Per HPI CV:  No palpitations, occasional LE edema of dependent pattern.  GU:  No hematuria, no frequency GI:  Per HPI Heme:  No history of anemia or need for supplemental iron or B12.   Transfusions:  None Neuro:  No headaches, no peripheral tingling or numbness.  No syncope Derm:  No itching, no rash or sores.  Endocrine:  No sweats or chills.  No polyuria or dysuria Immunization:  Not queried Travel:  None beyond local counties in last few months.    PHYSICAL EXAM: Vital signs in  last 24 hours: Filed Vitals:   02/06/15 0433  BP: 97/67  Pulse: 83  Temp: 97.9 F (36.6 C)  Resp: 18   Wt Readings from Last 3 Encounters:  02/03/15 128 lb (58.06 kg)  01/29/15 129 lb 1.6 oz (58.559 kg)  01/14/15 126 lb (57.153 kg)   GenPleasant, comfortable, thin AAM. Head:   no swelling or  facial asymmetry. No signs of trauma.  Eyes:   no icterus or pallor. Ears:   no hearing deficit  Nose:   no congestion or nasal discharge Mouth:   clear, moist MM. Dentition in good repair. Neck:  No TMG, no adenopathy, no JVD Lungs:  Somewhat diminished in the right base. No adventitious sounds. No dyspnea with speech or at rest. No cough. Heart: RRR. No MRG. S1/S2 audible. Pacemaker palpable in upper left chest. Port-A-Cath in the right upper chest Abdomen:  Thin, non-obese, NT, ND. No HSM, no mass.  Bowel sounds active. No bruits, no hernias Rectal: Deferred   Musc/Skeltl: No joint swelling, redness or gross deformities. Extremities:  No pedal edema  Neurologic:  A and O 3. No tremor, no limb weakness. Fully alert and engaged Skin:  No rash, no sores, no telangiectasia Tattoos:  None Nodes:  No cervical or inguinal adenopathy.   Psych:  Very pleasant, relaxed, intelligent  Intake/Output from previous day: 03/02 0701 - 03/03 0700 In: 240 [P.O.:240] Out: 1475 [Urine:1475] Intake/Output this shift: Total I/O In: 240 [P.O.:240] Out: 400 [Urine:400]  LAB RESULTS:  Recent Labs  02/04/15 1335 02/05/15 0500 02/06/15 0403  WBC 3.9* 3.2* 2.8*  HGB 11.5* 10.6* 10.1*  HCT 34.4* 31.2* 30.0*  PLT 154 148* 133*   BMET Lab Results  Component Value Date   NA 140 02/05/2015   NA 139 02/04/2015   NA 137 02/03/2015   K 3.6 02/05/2015   K 3.7 02/04/2015   K 3.6 02/03/2015   CL 107 02/05/2015   CL 106 02/04/2015   CL 105 02/03/2015   CO2 27 02/05/2015   CO2 24 02/04/2015   CO2 26 02/03/2015   GLUCOSE 94 02/05/2015   GLUCOSE 92 02/04/2015   GLUCOSE 110* 02/03/2015   BUN 7  02/05/2015   BUN 6 02/04/2015   BUN 8 02/03/2015   CREATININE 0.79 02/05/2015   CREATININE 0.79 02/04/2015   CREATININE 0.87 02/03/2015   CALCIUM 9.0 02/05/2015   CALCIUM 9.2 02/04/2015   CALCIUM 9.2 02/03/2015   LFT  Recent Labs  02/03/15 1707  PROT 5.4*  ALBUMIN 3.5  AST 62*  ALT 54*  ALKPHOS 67  BILITOT 0.8   PT/INR Lab Results  Component Value Date   INR 1.29 02/04/2015   INR 1.28 02/03/2015   Hepatitis Panel No results for input(s): HEPBSAG, HCVAB, HEPAIGM, HEPBIGM in the last 72 hours. C-Diff No components found for: CDIFF Lipase     Component Value Date/Time   LIPASE 42 02/03/2015 1707    Drugs of Abuse  No results found for: LABOPIA, COCAINSCRNUR, LABBENZ, AMPHETMU, THCU, LABBARB   RADIOLOGY STUDIES: Nm Myocar Single W/spect W/wall Motion And Ef  02/04/2015   CLINICAL DATA:  69 year old with multiple myeloma, lytic sternal lesions with elevated troponin, abnormal EKG, chest pain.  EXAM: MYOCARDIAL IMAGING WITH SPECT (REST AND PHARMACOLOGIC-STRESS)  GATED LEFT VENTRICULAR WALL MOTION STUDY  LEFT VENTRICULAR EJECTION FRACTION  TECHNIQUE: Standard myocardial SPECT imaging was performed after resting intravenous injection of 10 mCi Tc-19msestamibi. After rest imaging, stress portion was terminated and patient was brought to the cardiac catheterization lab by Dr. TRadford Pax  COMPARISON:  None.  FINDINGS: Perfusion: There appears to be normal perfusion uptake along the lateral wall distribution however other wall segments appear diffusely mildly reduced. There are no stress images to compare.  Wall Motion:  Not performed.  Left Ventricular Ejection Fraction: n/a %  End diastolic volume n/a ml  End systolic volume n/a  ml  IMPRESSION: 1. Abnormal rest images with mild reduced radiotracer uptake in all segments other than lateral wall.  2. No stress images performed. Test was not completed, patient proceeded to cardiac catheterization.   Electronically Signed   By: Candee Furbish    On: 02/04/2015 14:27    ENDOSCOPIC STUDIES: None ever.   IMPRESSION:   *  Non-cardiac chest pain.  Strong but not exclusive post-prandial association.  Progressive in last 2 months. ? GERD, ? Infectious esophagitis, ? Due to lytic sternal lesions.   *  Chest pain and elevated troponin.  Hx non-STEMI.  S/p cardiac cath 02/04/15: mild, non-obstructive CAD.   *  Multiple myeloma.  On Decadron, Velcade IV via porta cath. Sternal and spinal mets on CT 02/03/15.   *  HIV, on meds. CD4 count 340, helper T cells 19.   *  Anemia, Hgb 10 to 11.5.  Normocytic.  baseline Hgb 11 to 12.5.  Drop of 1 to 2 grams not unusual after cardiac cath.   *  Thrombocytopenia, stable, chronic.   *  Chronic Plavix, started around time of pacemaker insertion.  Also hx of TIA   PLAN:     *  EGD tomorrow, since pt had breakfast today need to wait. This will need to be diagnostic only since the patient is on Plavix (took today at 10 AM), biopsy probably not advisable.  It is set up for 11 AM tomorrow.  Alternative is to hold Plavix and bring pt back for outpt EGD.  Dr Deatra Ina will make final determination.     Azucena Freed  02/06/2015, 10:41 AM Pager: [phone]  GI Attending Note   Chart was reviewed and patient was examined. X-rays and lab were reviewed.    Patient has fairly poorly described chest pain.  Pain sounds more chest wall discomfort/musculoskeletal although I am not certain.  Esophageal mucosal disease is less likely but ought to be ruled out.  Chronic cholecystitis may sometimes cause chest pain as well and should also be considered.  Plan to proceed with upper endoscopy.  If negative obtain abdominal ultrasound.  Finally, may consider CT of the chest including the cervical and thoracic spine if these areas have not been well seen with CT angio.  Sandy Salaam. Deatra Ina, M.D., [facility] Cell [phone]

## 2015-02-07 ENCOUNTER — Observation Stay (HOSPITAL_COMMUNITY): Payer: Medicare Other

## 2015-02-07 ENCOUNTER — Encounter (HOSPITAL_COMMUNITY): Payer: Self-pay | Admitting: *Deleted

## 2015-02-07 ENCOUNTER — Encounter (HOSPITAL_COMMUNITY)
Admission: EM | Disposition: A | Discharge status: Home / Self Care | Payer: Self-pay | Source: Home / Self Care | Attending: Internal Medicine

## 2015-02-07 DIAGNOSIS — I214 Non-ST elevation (NSTEMI) myocardial infarction: Secondary | ICD-10-CM | POA: Diagnosis not present

## 2015-02-07 DIAGNOSIS — Z950 Presence of cardiac pacemaker: Secondary | ICD-10-CM | POA: Diagnosis not present

## 2015-02-07 DIAGNOSIS — B20 Human immunodeficiency virus [HIV] disease: Secondary | ICD-10-CM | POA: Diagnosis not present

## 2015-02-07 DIAGNOSIS — C9000 Multiple myeloma not having achieved remission: Secondary | ICD-10-CM

## 2015-02-07 DIAGNOSIS — C9002 Multiple myeloma in relapse: Secondary | ICD-10-CM | POA: Diagnosis not present

## 2015-02-07 HISTORY — PX: ESOPHAGOGASTRODUODENOSCOPY: SHX5428

## 2015-02-07 LAB — CBC
HEMATOCRIT: 29.4 % — AB (ref 39.0–52.0)
Hemoglobin: 10.1 g/dL — ABNORMAL LOW (ref 13.0–17.0)
MCH: 29.8 pg (ref 26.0–34.0)
MCHC: 34.4 g/dL (ref 30.0–36.0)
MCV: 86.7 fL (ref 78.0–100.0)
Platelets: 138 10*3/uL — ABNORMAL LOW (ref 150–400)
RBC: 3.39 MIL/uL — ABNORMAL LOW (ref 4.22–5.81)
RDW: 12.7 % (ref 11.5–15.5)
WBC: 2.7 10*3/uL — ABNORMAL LOW (ref 4.0–10.5)

## 2015-02-07 SURGERY — EGD (ESOPHAGOGASTRODUODENOSCOPY)
Anesthesia: Moderate Sedation

## 2015-02-07 MED ORDER — SODIUM CHLORIDE 0.9 % IV SOLN: INTRAVENOUS | Status: DC

## 2015-02-07 MED ORDER — MIDAZOLAM HCL 5 MG/5ML IJ SOLN
INTRAMUSCULAR | Status: DC | PRN
  Administered 2015-02-07: 2 mg via INTRAVENOUS
  Administered 2015-02-07: 1 mg via INTRAVENOUS

## 2015-02-07 MED ORDER — HYDROCODONE-ACETAMINOPHEN 10-325 MG PO TABS: 1.0000 | ORAL_TABLET | Freq: Four times a day (QID) | ORAL | Status: DC | PRN

## 2015-02-07 MED ORDER — FENTANYL CITRATE 0.05 MG/ML IJ SOLN
INTRAMUSCULAR | Status: AC
  Filled 2015-02-07: qty 2

## 2015-02-07 MED ORDER — FENTANYL CITRATE 0.05 MG/ML IJ SOLN
INTRAMUSCULAR | Status: DC | PRN
  Administered 2015-02-07: 25 ug via INTRAVENOUS

## 2015-02-07 MED ORDER — ACETAMINOPHEN 325 MG PO TABS: 650.0000 mg | ORAL_TABLET | ORAL | Status: DC | PRN

## 2015-02-07 MED ORDER — DIPHENHYDRAMINE HCL 50 MG/ML IJ SOLN
INTRAMUSCULAR | Status: AC
  Filled 2015-02-07: qty 1

## 2015-02-07 MED ORDER — MIDAZOLAM HCL 5 MG/ML IJ SOLN
INTRAMUSCULAR | Status: AC
  Filled 2015-02-07: qty 2

## 2015-02-07 MED ORDER — BOOST / RESOURCE BREEZE PO LIQD: 1.0000 | Freq: Three times a day (TID) | ORAL | Status: DC

## 2015-02-07 MED ORDER — ASPIRIN 81 MG PO CHEW: 81.0000 mg | CHEWABLE_TABLET | Freq: Every day | ORAL | Status: DC

## 2015-02-07 MED ORDER — FAMOTIDINE 20 MG PO TABS: 20.0000 mg | ORAL_TABLET | Freq: Every day | ORAL | Status: DC

## 2015-02-07 MED ORDER — IBUPROFEN 400 MG PO TABS: 400.0000 mg | ORAL_TABLET | Freq: Three times a day (TID) | ORAL | Status: DC

## 2015-02-07 NOTE — Progress Notes (Signed)
[facility]    Date of Admission:  02/03/2015   Total days of antibiotics 0   ID: Rick Potter is a 69 y.o. male with hx of HIV and multiple myeloma, hx of DDD placemaker who presented with atypical chest pain, mild troponin elevation and ecg changes. He was treated for NSTEMI, underwent heart catherization Active Problems:   HIV disease   Multiple myeloma in relapse   Pacemaker   NSTEMI (non-ST elevated myocardial infarction)   Chest pain   Angina pectoris   Elevated troponin   Protein-calorie malnutrition, severe    Subjective: Had egd which was normal this afternoon  Medications:  . aspirin  81 mg Oral Daily  . clopidogrel  75 mg Oral Daily  . famotidine  20 mg Oral Daily  . feeding supplement (RESOURCE BREEZE)  1 Container Oral TID BM  . ibuprofen  400 mg Oral TID    Objective: Vital signs in last 24 hours: Temp:  [97.4 F (36.3 C)-98 F (36.7 C)] 97.4 F (36.3 C) (03/04 1447) Pulse Rate:  [83-93] 88 (03/04 1447) Resp:  [16-22] 18 (03/04 1447) BP: (91-129)/(62-97) 119/82 mmHg (03/04 1447) SpO2:  [91 %-100 %] 99 % (03/04 1447)  Physical Exam  Constitutional: He is oriented to person, place, and time. He appears well-developed and well-nourished. No distress.  HENT:  Mouth/Throat: Oropharynx is clear and moist. No oropharyngeal exudate.  Cardiovascular: Normal rate, regular rhythm and normal heart sounds. Exam reveals no gallop and no friction rub.  No murmur heard.  Pulmonary/Chest: Effort normal and breath sounds normal. No respiratory distress. He has no wheezes.  Abdominal: Soft. Bowel sounds are normal. He exhibits no distension. There is no tenderness.  Lymphadenopathy:  He has no cervical adenopathy.  Neurological: He is alert and oriented to person, place, and time.  Skin: Skin is warm and dry. No rash noted. No erythema.  Psychiatric: He has a normal mood and affect. His behavior is normal.     Lab Results  Recent  Labs  02/05/15 0500 02/06/15 0403 02/07/15 0320  WBC 3.2* 2.8* 2.7*  HGB 10.6* 10.1* 10.1*  HCT 31.2* 30.0* 29.4*  NA 140  --   --   K 3.6  --   --   CL 107  --   --   CO2 27  --   --   BUN 7  --   --   CREATININE 0.79  --   --     Studies/Results: No results found.   Assessment/Plan: Atypical chest pain = underwent cath that did not show obstruction to cause his CP. EGD is normal. Next having RUQ U/S to look at gallbladder if possibly having colic associated pain? If normal may need to consider HIDA scan, but that can be done as out patinet  hiv disease = please resume his outpatient HIV regimen of  truvada plus prezcobix. Will start today   Baxter Flattery [facility] Cell: [phone] Pager: [phone]  02/07/2015, 3:28 PM

## 2015-02-07 NOTE — Progress Notes (Signed)
EGD is normal. Ultrasound ordered to r/o GB disease

## 2015-02-07 NOTE — Op Note (Signed)
[facility] [place], [id number]   ENDOSCOPY PROCEDURE REPORT  PATIENT: Rick Potter, Rick Potter  MR#: [id number] BIRTHDATE: 03-09-1946 , 73  yrs. old GENDER: male ENDOSCOPIST: Inda Castle, MD REFERRED BY: PROCEDURE DATE:  02/07/2015 PROCEDURE:  EGD, diagnostic ASA CLASS:     Class III INDICATIONS:  chest pain. MEDICATIONS: Versed 3 mg IV, and Fentanyl 25 mcg IV TOPICAL ANESTHETIC: Cetacaine Spray  DESCRIPTION OF PROCEDURE: After the risks benefits and alternatives of the procedure were thoroughly explained, informed consent was obtained.  The Pentax Gastroscope [id number] endoscope was introduced through the mouth and advanced to the third portion of the duodenum , Without limitations.  The instrument was slowly withdrawn as the mucosa was fully examined.      EXAM: The esophagus and gastroesophageal junction were completely normal in appearance.  The stomach was entered and closely examined.The antrum, angularis, and lesser curvature were well visualized, including a retroflexed view of the cardia and fundus. The stomach wall was normally distensable.  The scope passed easily through the pylorus into the duodenum.  Retroflexed views revealed no abnormalities.     The scope was then withdrawn from the patient and the procedure completed.  COMPLICATIONS: There were no immediate complications.  ENDOSCOPIC IMPRESSION: Normal appearing esophagus and GE junction, the stomach was well visualized and normal in appearance, normal appearing duodenum  RECOMMENDATIONS: My Ultrasound  REPEAT EXAM:  eSigned:  Inda Castle, MD 02/07/2015 12:57 PM    CC:

## 2015-02-07 NOTE — Progress Notes (Signed)
[facility] PROGRESS NOTE  Rick Potter YSA:[id number] DOB: October 08, 1946 DOA: 02/03/2015 PCP: [name], MD  Summary I have seen and examined Rick Potter at bedside and reviewed his chart. Appreciate cardiology. Rick Potter is a pleasant 69 y.o. gentleman with Multiple Myeloma (currently on Velcade and decadron, administered once weekly on Thursdays) and HIV (well-controlled, last viral load in January undetectable) who presented with chest pain concerning for acute coronary syndrome and he has ruled in for an NSTEMI- TROPONIN 0.61. Patient had CT chest which showed "1. No evidence acute pulmonary embolism. 2. No acute or parenchymal findings. 3. Lesions within the spine and sternum consistent with given history of multiple myeloma". I have spoken with Dr. Radford Pax who plans cardiac cath this morning. Patient reports that the pain is persistent. He has not taken HIV meds for 3-4 days because of not feeling well. Dr. Baxter Flattery will graciously see patient in consult. Will follow cardiology and ID recommendations. Keep patient nothing by mouth.   Plan Chest pain: possible pericarditis.  Patient underwent Cath: which showed Mild Nonobstructive CAD.  Evaluated by cardiology, recommendation is to treat with NSAID for pericarditis. Will start Ibuprofen, and Pepcid.  On aspirin/Plavix//NTG CT angio negative for PE.  2-D echo with normal Ef.  Pain worse after meals. GI consulted for further evaluation.  Endoscopy negative. Plan for RUQ [place].  If [place] negative might be able to be discharge today.   Hypotension; Received  IV bolus. Continue with  IV fluids. Need to be careful with opioids.   Mild elevated troponin:  Thought to be secondary to pericarditis. Cath with mild nonobstructive CAD,   HIV disease Resume meds at discharge.   Multiple myeloma in relapse No acute changes Will defer to oncology once patient discharged  Code Status: Full Family Communication: No family at  bedside Disposition Plan: Eventually home   Consultants:  Cardiology  ID  Procedures:  None  Antibiotics:  None  HPI/Subjective: Pain persist but better.  Objective: Filed Vitals:   02/07/15 1447  BP: 119/82  Pulse: 88  Temp: 97.4 F (36.3 C)  Resp: 18    Intake/Output Summary (Last 24 hours) at 02/07/15 1643 Last data filed at 02/07/15 1321  Gross per 24 hour  Intake    860 ml  Output   1225 ml  Net   -365 ml   Filed Weights   02/03/15 1621  Weight: 58.06 kg (128 lb)    Exam:   General: NAD.   Cardiovascular: S1-S2 normal. No murmurs. Pulse regular.  Respiratory: Good air entry bilaterally. No rhonchi or rales.  Abdomen: Soft and nontender. Normal bowel sounds. No organomegaly.  Musculoskeletal: No pedal edema   Neurological: Intact  Data Reviewed: Basic Metabolic Panel:  Recent Labs Lab 02/03/15 1707 02/04/15 1335 02/05/15 0500  NA 137 139 140  K 3.6 3.7 3.6  CL 105 106 107  CO2 26 24 27   GLUCOSE 110* 92 94  BUN 8 6 7   CREATININE 0.87 0.79 0.79  CALCIUM 9.2 9.2 9.0   Liver Function Tests:  Recent Labs Lab 02/03/15 1707  AST 62*  ALT 54*  ALKPHOS 67  BILITOT 0.8  PROT 5.4*  ALBUMIN 3.5    Recent Labs Lab 02/03/15 1707  LIPASE 42   No results for input(s): AMMONIA in the last 168 hours. CBC:  Recent Labs Lab 02/03/15 1707 02/04/15 0626 02/04/15 1335 02/05/15 0500 02/06/15 0403 02/07/15 0320  WBC 4.1 3.1* 3.9* 3.2* 2.8* 2.7*  NEUTROABS 2.1  --   --   --   --   --  HGB 10.9* 10.4* 11.5* 10.6* 10.1* 10.1*  HCT 32.4* 30.8* 34.4* 31.2* 30.0* 29.4*  MCV 87.6 88.0 88.9 88.4 86.7 86.7  PLT 143* 130* 154 148* 133* 138*   Cardiac Enzymes:  Recent Labs Lab 02/03/15 2020 02/04/15 0024 02/04/15 0626  TROPONINI 0.32* 0.37* 0.61*   BNP (last 3 results)  Recent Labs  12/19/14 1633 02/03/15 1707  BNP 486.7* 621.5*    ProBNP (last 3 results) No results for input(s): PROBNP in the last 8760  hours.  CBG: No results for input(s): GLUCAP in the last 168 hours.  No results found for this or any previous visit (from the past 240 hour(s)).   Studies: No results found.  Scheduled Meds: . aspirin  81 mg Oral Daily  . clopidogrel  75 mg Oral Daily  . famotidine  20 mg Oral Daily  . feeding supplement (RESOURCE BREEZE)  1 Container Oral TID BM  . ibuprofen  400 mg Oral TID   Continuous Infusions: . sodium chloride 100 mL/hr at 02/06/15 2235  . sodium chloride       Time spent: 25 minutes    Casha Estupinan, [facility] Pager [phone]. If 7PM-7AM, please contact night-coverage at [email or web address], password [facility] 02/07/2015, 4:43 PM  LOS: 4 days

## 2015-02-07 NOTE — H&P (View-Only) (Signed)
[facility] Consult: 10:41 AM 02/06/2015  LOS: 3 days    Referring Provider: Dr Tyrell Antonio  Primary Care Physician:  [name], MD Primary Gastroenterologist:  Althia Forts ID:  Dr Graylon Good Onc: Dr Alen Blew    Reason for Consultation:  Non cardiac chest pain.    HPI: Heidi Lemay is a 69 y.o. male.  Hx HIV on meds, myltiple myeloma on decadron and chemotherapy with Velcade, s/p cardiac pacemaker. On Plavix which is not on hold.  S/p cervical laminectomy. Previously resided in [place] but moved to [place] within the last 10 months.    For the last couple of months patient has had progressive midsternal pain. This is not exertional. For several weeks it was postprandial, lasting 5-7 minutes.. It did not respond to PPI which he took for at least one month. Percocet helps a little.  There was no associated nausea, dysphagia or odynophagia.  Sometimes the pain radiates into his cervical spine.  The pain progressed to nocturnal symptoms. There seemed to be some relief if he would get up and walk around.   There has been weight loss of 20 pounds in the last 6 weeks and he confirms anorexia and limited by mouth intake. Patient has never undergone EGD or colonoscopy. He has daily, brown bowel movements. He has never seen blood or melena in the stool.  In the day prior to admission the patient developed severe, unrelenting chest pain associated with weakness and dyspnea (however patient has history of chronic, stable DOE).   Patient ruled in for non-STEMI .  Cardiac catheterization 02/04/15 showed mild, nonobstructive CAD.  Pertinent review of systems is the fact that the patient was restarted on the Decadron and Velcade about 4 weeks ago. He had been on this medication up until about 6 months ago.  The chest pain  symptoms began prior to reinitiating these medications.      Past Medical History  Diagnosis Date  . HIV infection dx'd 1990    Viral load undetectable in January with CD4 count of 340  . Hyperlipidemia     Patient denies this hx on 02/04/2015  . Presence of permanent cardiac pacemaker   . TIA (transient ischemic attack) ~ 2005  . Borderline type 2 diabetes mellitus     "Borderline" under surveillance, no medical therapy  . Multiple myeloma dx'd 2011    Past Surgical History  Procedure Laterality Date  . Cervical laminectomy  ~ 2012  . Insert / replace / remove pacemaker  ~ 2003  . Inguinal hernia repair Bilateral ~ 1999  . Hernia repair  ~ 1999    UHR  . Umbilical hernia repair    . Mediport insertion, single Right ~ 2012  . Left heart catheterization with coronary angiogram N/A 02/04/2015    Procedure: LEFT HEART CATHETERIZATION WITH CORONARY ANGIOGRAM;  Surgeon: Peter M Martinique, MD;  [facility] C[facility] LAB;  Service: Cardiovascular;  Laterality: N/A;    Prior to Admission medications   Medication Sig Start Date End Date Taking? Authorizing Provider  clopidogrel (PLAVIX) 75 MG tablet Take 75  mg by mouth daily.   Yes Historical Provider, MD  darunavir-cobicistat (PREZCOBIX) 800-150 MG per tablet Take 1 tablet by mouth daily. Swallow whole. Do NOT crush, break or chew tablets. Take with food. 12/09/14  Yes Carlyle Basques, MD  dexamethasone (DECADRON) 4 MG tablet Take 5 tablets weekly with chemotherapy. 12/24/14  Yes Wyatt Portela, MD  emtricitabine-tenofovir (TRUVADA) 200-300 MG per tablet Take 1 tablet by mouth daily. 09/02/14  Yes Carlyle Basques, MD  HYDROcodone-acetaminophen [facility]) 10-325 MG per tablet Take 1 tablet by mouth every 6 (six) hours as needed. 01/29/15  Yes Wyatt Portela, MD  prochlorperazine (COMPAZINE) 10 MG tablet Take 1 tablet (10 mg total) by mouth every 6 (six) hours as needed for nausea or vomiting. 12/31/14  Yes Wyatt Portela, MD    Scheduled Meds: . aspirin   81 mg Oral Daily  . clopidogrel  75 mg Oral Daily  . famotidine  20 mg Oral Daily  . feeding supplement (RESOURCE BREEZE)  1 Container Oral TID BM  . ibuprofen  400 mg Oral TID   Infusions: . sodium chloride 100 mL/hr at 02/05/15 1625   PRN Meds: acetaminophen, gi cocktail, HYDROcodone-acetaminophen, morphine injection, nitroGLYCERIN, ondansetron (ZOFRAN) IV   Allergies as of 02/03/2015  . (No Known Allergies)    Family History  Problem Relation Age of Onset  . Stroke Mother   . Cancer Sister   . Diabetes Brother   . Alcohol abuse Brother     History   Social History  . Marital Status: Single    Spouse Name: N/A  . Number of Children: N/A  . Years of Education: N/A   Occupational History  . 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 Smoking status: Former Smoker -- 0.50 packs/day for 35 years    Types: Cigarettes    Quit date: 12/06/1997  . Smokeless tobacco: Never Used  . Alcohol Use: No  . Drug Use: Yes    Special: Marijuana     Comment: "stopped smoking recreational marijuana ~ 2005"  . Sexual Activity: No     Comment: declined condoms   Other Topics Concern  . Not on file   Social History Narrative   He is not married.  He does not have any children.  Has been in [place] for about 10 months.  Relocated from [place].    REVIEW OF SYSTEMS: Constitutional:  Per HPI ENT:  No nose bleeds Pulm:  Per HPI CV:  No palpitations, occasional LE edema of dependent pattern.  GU:  No hematuria, no frequency GI:  Per HPI Heme:  No history of anemia or need for supplemental iron or B12.   Transfusions:  None Neuro:  No headaches, no peripheral tingling or numbness.  No syncope Derm:  No itching, no rash or sores.  Endocrine:  No sweats or chills.  No polyuria or dysuria Immunization:  Not queried Travel:  None beyond local counties in last few months.    PHYSICAL EXAM: Vital signs in  last 24 hours: Filed Vitals:   02/06/15 0433  BP: 97/67  Pulse: 83  Temp: 97.9 F (36.6 C)  Resp: 18   Wt Readings from Last 3 Encounters:  02/03/15 128 lb (58.06 kg)  01/29/15 129 lb 1.6 oz (58.559 kg)  01/14/15 126 lb (57.153 kg)   GenPleasant, comfortable, thin AAM. Head:   no swelling or  facial asymmetry. No signs of trauma.  Eyes:   no icterus or pallor. Ears:   no hearing deficit  Nose:   no congestion or nasal discharge Mouth:   clear, moist MM. Dentition in good repair. Neck:  No TMG, no adenopathy, no JVD Lungs:  Somewhat diminished in the right base. No adventitious sounds. No dyspnea with speech or at rest. No cough. Heart: RRR. No MRG. S1/S2 audible. Pacemaker palpable in upper left chest. Port-A-Cath in the right upper chest Abdomen:  Thin, non-obese, NT, ND. No HSM, no mass.  Bowel sounds active. No bruits, no hernias Rectal: Deferred   Musc/Skeltl: No joint swelling, redness or gross deformities. Extremities:  No pedal edema  Neurologic:  A and O 3. No tremor, no limb weakness. Fully alert and engaged Skin:  No rash, no sores, no telangiectasia Tattoos:  None Nodes:  No cervical or inguinal adenopathy.   Psych:  Very pleasant, relaxed, intelligent  Intake/Output from previous day: 03/02 0701 - 03/03 0700 In: 240 [P.O.:240] Out: 1475 [Urine:1475] Intake/Output this shift: Total I/O In: 240 [P.O.:240] Out: 400 [Urine:400]  LAB RESULTS:  Recent Labs  02/04/15 1335 02/05/15 0500 02/06/15 0403  WBC 3.9* 3.2* 2.8*  HGB 11.5* 10.6* 10.1*  HCT 34.4* 31.2* 30.0*  PLT 154 148* 133*   BMET Lab Results  Component Value Date   NA 140 02/05/2015   NA 139 02/04/2015   NA 137 02/03/2015   K 3.6 02/05/2015   K 3.7 02/04/2015   K 3.6 02/03/2015   CL 107 02/05/2015   CL 106 02/04/2015   CL 105 02/03/2015   CO2 27 02/05/2015   CO2 24 02/04/2015   CO2 26 02/03/2015   GLUCOSE 94 02/05/2015   GLUCOSE 92 02/04/2015   GLUCOSE 110* 02/03/2015   BUN 7  02/05/2015   BUN 6 02/04/2015   BUN 8 02/03/2015   CREATININE 0.79 02/05/2015   CREATININE 0.79 02/04/2015   CREATININE 0.87 02/03/2015   CALCIUM 9.0 02/05/2015   CALCIUM 9.2 02/04/2015   CALCIUM 9.2 02/03/2015   LFT  Recent Labs  02/03/15 1707  PROT 5.4*  ALBUMIN 3.5  AST 62*  ALT 54*  ALKPHOS 67  BILITOT 0.8   PT/INR Lab Results  Component Value Date   INR 1.29 02/04/2015   INR 1.28 02/03/2015   Hepatitis Panel No results for input(s): HEPBSAG, HCVAB, HEPAIGM, HEPBIGM in the last 72 hours. C-Diff No components found for: CDIFF Lipase     Component Value Date/Time   LIPASE 42 02/03/2015 1707    Drugs of Abuse  No results found for: LABOPIA, COCAINSCRNUR, LABBENZ, AMPHETMU, THCU, LABBARB   RADIOLOGY STUDIES: Nm Myocar Single W/spect W/wall Motion And Ef  02/04/2015   CLINICAL DATA:  69 year old with multiple myeloma, lytic sternal lesions with elevated troponin, abnormal EKG, chest pain.  EXAM: MYOCARDIAL IMAGING WITH SPECT (REST AND PHARMACOLOGIC-STRESS)  GATED LEFT VENTRICULAR WALL MOTION STUDY  LEFT VENTRICULAR EJECTION FRACTION  TECHNIQUE: Standard myocardial SPECT imaging was performed after resting intravenous injection of 10 mCi [id number]. After rest imaging, stress portion was terminated and patient was brought to the cardiac catheterization lab by Dr. TRadford Pax  COMPARISON:  None.  FINDINGS: Perfusion: There appears to be normal perfusion uptake along the lateral wall distribution however other wall segments appear diffusely mildly reduced. There are no stress images to compare.  Wall Motion:  Not performed.  Left Ventricular Ejection Fraction: n/a %  End diastolic volume n/a ml  End systolic volume n/a  ml  IMPRESSION: 1. Abnormal rest images with mild reduced radiotracer uptake in all segments other than lateral wall.  2. No stress images performed. Test was not completed, patient proceeded to cardiac catheterization.   Electronically Signed   By: Candee Furbish    On: 02/04/2015 14:27    ENDOSCOPIC STUDIES: None ever.   IMPRESSION:   *  Non-cardiac chest pain.  Strong but not exclusive post-prandial association.  Progressive in last 2 months. ? GERD, ? Infectious esophagitis, ? Due to lytic sternal lesions.   *  Chest pain and elevated troponin.  Hx non-STEMI.  S/p cardiac cath 02/04/15: mild, non-obstructive CAD.   *  Multiple myeloma.  On Decadron, Velcade IV via porta cath. Sternal and spinal mets on CT 02/03/15.   *  HIV, on meds. CD4 count 340, helper T cells 19.   *  Anemia, Hgb 10 to 11.5.  Normocytic.  baseline Hgb 11 to 12.5.  Drop of 1 to 2 grams not unusual after cardiac cath.   *  Thrombocytopenia, stable, chronic.   *  Chronic Plavix, started around time of pacemaker insertion.  Also hx of TIA   PLAN:     *  EGD tomorrow, since pt had breakfast today need to wait. This will need to be diagnostic only since the patient is on Plavix (took today at 10 AM), biopsy probably not advisable.  It is set up for 11 AM tomorrow.  Alternative is to hold Plavix and bring pt back for outpt EGD.  Dr Deatra Ina will make final determination.     Azucena Freed  02/06/2015, 10:41 AM Pager: [phone]  GI Attending Note   Chart was reviewed and patient was examined. X-rays and lab were reviewed.    Patient has fairly poorly described chest pain.  Pain sounds more chest wall discomfort/musculoskeletal although I am not certain.  Esophageal mucosal disease is less likely but ought to be ruled out.  Chronic cholecystitis may sometimes cause chest pain as well and should also be considered.  Plan to proceed with upper endoscopy.  If negative obtain abdominal ultrasound.  Finally, may consider CT of the chest including the cervical and thoracic spine if these areas have not been well seen with CT angio.  Sandy Salaam. Deatra Ina, M.D., [facility] Cell [phone]

## 2015-02-07 NOTE — Progress Notes (Signed)
02/07/2015 6:33 PM Nursing note Dr. Deatra Ina paged and made aware of ABD [place] results. No new orders at this time. Will continue to monitor patient.  Treanna Dumler, Arville Lime

## 2015-02-07 NOTE — Progress Notes (Signed)
UR completed 

## 2015-02-07 NOTE — Interval H&P Note (Signed)
History and Physical Interval Note:  02/07/2015 12:34 PM  Rick Potter  has presented today for surgery, with the diagnosis of Non-cardiac chest pain.  The various methods of treatment have been discussed with the patient and family. After consideration of risks, benefits and other options for treatment, the patient has consented to  Procedure(s): ESOPHAGOGASTRODUODENOSCOPY (EGD) (N/A) as a surgical intervention .  The patient's history has been reviewed, patient examined, no change in status, stable for surgery.  I have reviewed the patient's chart and labs.  Questions were answered to the patient's satisfaction.    The recent H&P (dated *02/06/15**) was reviewed, the patient was examined and there is no change in the patients condition since that H&P was completed.   Erskine Emery  02/07/2015, 12:34 PM    Erskine Emery

## 2015-02-08 ENCOUNTER — Observation Stay (HOSPITAL_COMMUNITY): Payer: Medicare Other

## 2015-02-08 DIAGNOSIS — R109 Unspecified abdominal pain: Secondary | ICD-10-CM | POA: Insufficient documentation

## 2015-02-08 DIAGNOSIS — R1010 Upper abdominal pain, unspecified: Secondary | ICD-10-CM

## 2015-02-08 LAB — CBC
HCT: 30 % — ABNORMAL LOW (ref 39.0–52.0)
Hemoglobin: 10.2 g/dL — ABNORMAL LOW (ref 13.0–17.0)
MCH: 29.4 pg (ref 26.0–34.0)
MCHC: 34 g/dL (ref 30.0–36.0)
MCV: 86.5 fL (ref 78.0–100.0)
Platelets: 150 10*3/uL (ref 150–400)
RBC: 3.47 MIL/uL — AB (ref 4.22–5.81)
RDW: 12.8 % (ref 11.5–15.5)
WBC: 3.5 10*3/uL — ABNORMAL LOW (ref 4.0–10.5)

## 2015-02-08 MED ORDER — TECHNETIUM TC 99M MEBROFENIN IV KIT
5.0000 | PACK | Freq: Once | INTRAVENOUS | Status: AC | PRN
  Administered 2015-02-08: 5 via INTRAVENOUS

## 2015-02-08 NOTE — Progress Notes (Signed)
    Progress Note   Subjective  Complaining of very low-grade chest discomfort and pain in the back of his neck and spine**   Objective  Vital signs in last 24 hours: Temp:  [97.4 F (36.3 C)-98 F (36.7 C)] 98 F (36.7 C) (03/05 0615) Pulse Rate:  [81-107] 81 (03/05 0615) Resp:  [16-22] 18 (03/05 0615) BP: (91-129)/(60-97) 103/60 mmHg (03/05 0615) SpO2:  [91 %-100 %] 98 % (03/05 0615) Last BM Date: 02/06/15 General:   Alert,  Well-developed,   in NAD Heart:  Regular rate and rhythm; no murmurs Abdomen:  Soft, nontender and nondistended. Normal bowel sounds, without guarding, and without rebound.   Extremities:  Without edema. Neurologic:  Alert and  oriented x4;  grossly normal neurologically. Psych:  Alert and cooperative. Normal mood and affect.  Intake/Output from previous day: 03/04 0701 - 03/05 0700 In: 500 [I.V.:500] Out: 500 [Urine:500] Intake/Output this shift:    Lab Results:  Recent Labs  02/06/15 0403 02/07/15 0320 02/08/15 0300  WBC 2.8* 2.7* 3.5*  HGB 10.1* 10.1* 10.2*  HCT 30.0* 29.4* 30.0*  PLT 133* 138* 150   BMET No results for input(s): NA, K, CL, CO2, GLUCOSE, BUN, CREATININE, CALCIUM in the last 72 hours. LFT No results for input(s): PROT, ALBUMIN, AST, ALT, ALKPHOS, BILITOT, BILIDIR, IBILI in the last 72 hours. PT/INR No results for input(s): LABPROT, INR in the last 72 hours. Hepatitis Panel No results for input(s): HEPBSAG, HCVAB, HEPAIGM, HEPBIGM in the last 72 hours.  Studies/Results: Us Abdomen Complete  02/07/2015   CLINICAL DATA:  Chest pain x6 weeks  EXAM: ULTRASOUND ABDOMEN COMPLETE  COMPARISON:  None.  FINDINGS: Gallbladder: Layering gallbladder sludge versus non shadowing gallstones. No gallbladder wall thickening or pericholecystic fluid. Negative sonographic Murphy's sign.  Common bile duct: Diameter: 3 mm  Liver: At the upper limits of normal for parenchymal echogenicity. Irregular 8 x 8 x 9 mm hypoechoic lesion along the  gallbladder fossa (image 32), possibly reflecting focal fatty sparing, although incompletely characterized.  IVC: No abnormality visualized.  Pancreas: Poorly visualized.  Spleen: Size and appearance within normal limits.  Right Kidney: Length: 10.0 cm.  No mass or hydronephrosis.  Left Kidney: Length: 11.2 cm.  No mass or hydronephrosis.  Abdominal aorta: No aneurysm visualized.  Other findings: Bilateral pleural effusions.  IMPRESSION: Layering gallbladder sludge versus non shadowing gallstones. No associated sonographic findings to suggest acute cholecystitis.  9 mm hypoechoic lesion along the gallbladder fossa, possibly reflecting focal fatty sparing, although poorly evaluated/incompletely characterized. Given small size, follow-up MRI abdomen with/without contrast is suggested in 3 months.  Bilateral pleural effusions.   Electronically Signed   By: Sriyesh  Krishnan M.D.   On: 02/07/2015 17:35      Assessment & Plan  *Chest pain.  Upper endoscopy was negative.  Ultrasound shows sludge or non-shadowing stones in the gallbladder but no signs for acute cholecystitis.  I suspect that pain is musculoskeletal in origin, perhaps related to cervical spine disease.  Nonetheless would obtain a HIDA scan and, if negative, C-spine films   Active Problems:   HIV disease   Multiple myeloma in relapse   Pacemaker   NSTEMI (non-ST elevated myocardial infarction)   Chest pain   Angina pectoris   Elevated troponin   Protein-calorie malnutrition, severe     LOS: 5 days   Robert Kaplan  02/08/2015, 11:16 AM   

## 2015-02-08 NOTE — Progress Notes (Signed)
[facility] PROGRESS NOTE  Rick Potter OYD:[id number] DOB: 07-10-1946 DOA: 02/03/2015 PCP: [name], MD  Summary I have seen and examined Rick Potter at bedside and reviewed his chart. Appreciate cardiology. Rick Potter is a pleasant 69 y.o. gentleman with Multiple Myeloma (currently on Velcade and decadron, administered once weekly on Thursdays) and HIV (well-controlled, last viral load in January undetectable) who presented with chest pain concerning for acute coronary syndrome and he has ruled in for an NSTEMI- TROPONIN 0.61. Patient had CT chest which showed "1. No evidence acute pulmonary embolism. 2. No acute or parenchymal findings. 3. Lesions within the spine and sternum consistent with given history of multiple myeloma". I have spoken with Dr. Radford Pax who plans cardiac cath this morning. Patient reports that the pain is persistent. He has not taken HIV meds for 3-4 days because of not feeling well. Dr. Baxter Flattery will graciously see patient in consult. Will follow cardiology and ID recommendations. Keep patient nothing by mouth.   Plan Chest pain: possible pericarditis.  Patient underwent Cath: which showed Mild Nonobstructive CAD.  Evaluated by cardiology, recommendation is to treat with NSAID for pericarditis. Will start Ibuprofen, and Pepcid.  On aspirin/Plavix//NTG CT angio negative for PE.  2-D echo with normal Ef.  Pain worse after meals. GI consulted for further evaluation.  Endoscopy negative. US showed gallbladder sludge.  HIDA scan ordered.    Hypotension; Received  IV bolus. Continue with  IV fluids. Need to be careful with opioids.   Mild elevated troponin:  Thought to be secondary to pericarditis. Cath with mild nonobstructive CAD,   HIV disease Resume meds at discharge.   Multiple myeloma in relapse No acute changes Will defer to oncology once patient discharged  Code Status: Full Family Communication: No family at bedside Disposition Plan:  Eventually home   Consultants:  Cardiology  ID  Procedures:  None  Antibiotics:  None  HPI/Subjective: Pain persist but better.  At times related with meals   Objective: Filed Vitals:   02/08/15 0615  BP: 103/60  Pulse: 81  Temp: 98 F (36.7 C)  Resp: 18    Intake/Output Summary (Last 24 hours) at 02/08/15 1258 Last data filed at 02/07/15 1321  Gross per 24 hour  Intake    500 ml  Output    500 ml  Net      0 ml   Filed Weights   02/03/15 1621  Weight: 58.06 kg (128 lb)    Exam:   General: NAD.   Cardiovascular: S1-S2 normal. No murmurs. Pulse regular.  Respiratory: Good air entry bilaterally. No rhonchi or rales.  Abdomen: Soft and nontender. Normal bowel sounds. No organomegaly.  Musculoskeletal: No pedal edema   Neurological: Intact  Data Reviewed: Basic Metabolic Panel:  Recent Labs Lab 02/03/15 1707 02/04/15 1335 02/05/15 0500  NA 137 139 140  K 3.6 3.7 3.6  CL 105 106 107  CO2 26 24 27   GLUCOSE 110* 92 94  BUN 8 6 7   CREATININE 0.87 0.79 0.79  CALCIUM 9.2 9.2 9.0   Liver Function Tests:  Recent Labs Lab 02/03/15 1707  AST 62*  ALT 54*  ALKPHOS 67  BILITOT 0.8  PROT 5.4*  ALBUMIN 3.5    Recent Labs Lab 02/03/15 1707  LIPASE 42   No results for input(s): AMMONIA in the last 168 hours. CBC:  Recent Labs Lab 02/03/15 1707  02/04/15 1335 02/05/15 0500 02/06/15 0403 02/07/15 0320 02/08/15 0300  WBC 4.1  < > 3.9* 3.2* 2.8*  2.7* 3.5*  NEUTROABS 2.1  --   --   --   --   --   --   HGB 10.9*  < > 11.5* 10.6* 10.1* 10.1* 10.2*  HCT 32.4*  < > 34.4* 31.2* 30.0* 29.4* 30.0*  MCV 87.6  < > 88.9 88.4 86.7 86.7 86.5  PLT 143*  < > 154 148* 133* 138* 150  < > = values in this interval not displayed. Cardiac Enzymes:  Recent Labs Lab 02/03/15 2020 02/04/15 0024 02/04/15 0626  TROPONINI 0.32* 0.37* 0.61*   BNP (last 3 results)  Recent Labs  12/19/14 1633 02/03/15 1707  BNP 486.7* 621.5*    ProBNP (last  3 results) No results for input(s): PROBNP in the last 8760 hours.  CBG: No results for input(s): GLUCAP in the last 168 hours.  No results found for this or any previous visit (from the past 240 hour(s)).   Studies: US Abdomen Complete  02/07/2015   CLINICAL DATA:  Chest pain x6 weeks  EXAM: ULTRASOUND ABDOMEN COMPLETE  COMPARISON:  None.  FINDINGS: Gallbladder: Layering gallbladder sludge versus non shadowing gallstones. No gallbladder wall thickening or pericholecystic fluid. Negative sonographic Murphy's sign.  Common bile duct: Diameter: 3 mm  Liver: At the upper limits of normal for parenchymal echogenicity. Irregular 8 x 8 x 9 mm hypoechoic lesion along the gallbladder fossa (image 32), possibly reflecting focal fatty sparing, although incompletely characterized.  IVC: No abnormality visualized.  Pancreas: Poorly visualized.  Spleen: Size and appearance within normal limits.  Right Kidney: Length: 10.0 cm.  No mass or hydronephrosis.  Left Kidney: Length: 11.2 cm.  No mass or hydronephrosis.  Abdominal aorta: No aneurysm visualized.  Other findings: Bilateral pleural effusions.  IMPRESSION: Layering gallbladder sludge versus non shadowing gallstones. No associated sonographic findings to suggest acute cholecystitis.  9 mm hypoechoic lesion along the gallbladder fossa, possibly reflecting focal fatty sparing, although poorly evaluated/incompletely characterized. Given small size, follow-up MRI abdomen with/without contrast is suggested in 3 months.  Bilateral pleural effusions.   Electronically Signed   By: Julian Hy M.D.   On: 02/07/2015 17:35    Scheduled Meds: . aspirin  81 mg Oral Daily  . clopidogrel  75 mg Oral Daily  . famotidine  20 mg Oral Daily  . feeding supplement (RESOURCE BREEZE)  1 Container Oral TID BM  . ibuprofen  400 mg Oral TID   Continuous Infusions: . sodium chloride 100 mL/hr at 02/06/15 2235  . sodium chloride       Time spent: 15  minutes    Welby Montminy, [facility] Pager [phone]. If 7PM-7AM, please contact night-coverage at [email or web address], password [facility] 02/08/2015, 12:58 PM  LOS: 5 days

## 2015-02-08 NOTE — Progress Notes (Signed)
Utilization review completed.  P.J. Jesua Tamblyn,RN,BSN Case Manager 

## 2015-02-09 DIAGNOSIS — R609 Edema, unspecified: Secondary | ICD-10-CM

## 2015-02-09 LAB — BASIC METABOLIC PANEL
ANION GAP: 7 (ref 5–15)
BUN: 7 mg/dL (ref 6–23)
CO2: 26 mmol/L (ref 19–32)
Calcium: 9.1 mg/dL (ref 8.4–10.5)
Chloride: 107 mmol/L (ref 96–112)
Creatinine, Ser: 0.84 mg/dL (ref 0.50–1.35)
GFR calc non Af Amer: 89 mL/min — ABNORMAL LOW (ref >90)
GLUCOSE: 168 mg/dL — AB (ref 70–99)
Potassium: 3.6 mmol/L (ref 3.5–5.1)
Sodium: 140 mmol/L (ref 135–145)

## 2015-02-09 LAB — CBC
HEMATOCRIT: 28.9 % — AB (ref 39.0–52.0)
Hemoglobin: 9.8 g/dL — ABNORMAL LOW (ref 13.0–17.0)
MCH: 29.3 pg (ref 26.0–34.0)
MCHC: 33.9 g/dL (ref 30.0–36.0)
MCV: 86.3 fL (ref 78.0–100.0)
PLATELETS: 145 10*3/uL — AB (ref 150–400)
RBC: 3.35 MIL/uL — ABNORMAL LOW (ref 4.22–5.81)
RDW: 12.8 % (ref 11.5–15.5)
WBC: 3 10*3/uL — ABNORMAL LOW (ref 4.0–10.5)

## 2015-02-09 MED ORDER — FUROSEMIDE 20 MG PO TABS: 20.0000 mg | ORAL_TABLET | Freq: Every day | ORAL | Status: DC

## 2015-02-09 MED ORDER — POTASSIUM CHLORIDE ER 20 MEQ PO TBCR: 20.0000 meq | EXTENDED_RELEASE_TABLET | Freq: Every day | ORAL | Status: DC

## 2015-02-09 NOTE — Progress Notes (Signed)
Utilization review completed.  P.J. Tonea Leiphart,RN,BSN Case Manager 

## 2015-02-09 NOTE — Discharge Summary (Signed)
Physician Discharge Summary  Rick Potter BTD:[id number] DOB: 06-14-1946 DOA: 02/03/2015  PCP: [name], MD  Admit date: 02/03/2015 Discharge date: 02/09/2015  Time spent: 35 minutes  Recommendations for Outpatient Follow-up:  1. Follow up with oncology for further care of multiple myeloma.  2. Follow up with PCP for B-met, might need further lasix if  LE edema persist 3. Further evaluation of chest pain if doesn't improved.   Discharge Diagnoses:   Chest pain, pericarditis vs muscle skeletal.    HIV disease   Multiple myeloma in relapse   Pacemaker   Chest pain   Angina pectoris   Elevated troponin   Protein-calorie malnutrition, severe   Abdominal pain   Discharge Condition: Stable.   Diet recommendation: heart healthy  Filed Weights   02/03/15 1621  Weight: 58.06 kg (128 lb)    History of present illness:  Rick Potter is a 69 y.o. gentleman with a PMHx significant for MM (currently on Velcade and decadron, administered once weekly on Thursdays) and HIV (well-controlled, last viral load in January undetectable) who presents for evaluation of recurrent exertional chest pain that has worsened in intensity over the past several days. The patient has had chest pain episode for the past few weeks. He says that he initially noticed the pain after eating, but a trial of daily antacids for approximately one month never helped his symptoms. He now reports a substernal pressure that radiates to his back; it has been exertional but has also happened at rest and has awakened him from sleep. Pain lasts 5-7 minutes at a time. It has been associated with SOB but no diaphoresis, no light-headedness, no syncope. He has had increased fatigue. He has NOT taken his HIV medications for the past several days, due to feeling ill. His ID physician is not aware.  ED evaluation concerning for a troponin of 0.25 upon presentation and cardiology has already been consulted. CTA chest  also ordered.  The patient is being admitted to the hospitalist service, with co-management from cardiology.  Hospital Course:  have seen and examined Rick Potter at bedside and reviewed his chart. Appreciate cardiology. Rick Potter is a pleasant 69 y.o. gentleman with Multiple Myeloma (currently on Velcade and decadron, administered once weekly on Thursdays) and HIV (well-controlled, last viral load in January undetectable) who presented with chest pain concerning for acute coronary syndrome and he has ruled in for an NSTEMI- TROPONIN 0.61. Patient had CT chest which showed "1. No evidence acute pulmonary embolism. 2. No acute or parenchymal findings. 3. Lesions within the spine and sternum consistent with given history of multiple myeloma". I have spoken with Dr. Radford Pax who plans cardiac cath this morning. Patient reports that the pain is persistent. He has not taken HIV meds for 3-4 days because of not feeling well. Dr. Baxter Flattery will graciously see patient in consult. Will follow cardiology and ID recommendations. Keep patient nothing by mouth.   Plan Chest pain: possible pericarditis.  Patient underwent Cath: which showed Mild Nonobstructive CAD.  Evaluated by cardiology, recommendation is to treat with NSAID for pericarditis. Will start Ibuprofen, and Pepcid.  On aspirin/Plavix//NTG CT angio negative for PE.  2-D echo with normal Ef.  Pain worse after meals. GI consulted for further evaluation.  Endoscopy negative. US showed gallbladder sludge.  HIDA scan negative for acute cholecystitis, low EF. If pain persist might need further evaluation.    Hypotension; resolved.  Received IV bolus. Continue with IV fluids. Need to be careful with opioids.   Mild  elevated troponin:  Thought to be secondary to pericarditis. Cath with mild nonobstructive CAD,   HIV disease Resume meds at discharge.   Multiple myeloma in relapse No acute changes Will defer to oncology once patient  discharged  Lower extremity edema; start low dose lasix. Doppler pending.   Procedures:  Endoscopy; negative  HIDA scan negative for cholecystitis, low EF,   Doppler LE.   Consultations:  Cardiology  ID  GI  Discharge Exam: Filed Vitals:   02/09/15 0537  BP: 98/66  Pulse: 80  Temp: 98 F (36.7 C)  Resp: 18    General: Alert in no distress.  Cardiovascular: S 1, S 2 RRR Respiratory: CTA LE ; positive for  edema  Discharge Instructions    Current Discharge Medication List    START taking these medications   Details  acetaminophen (TYLENOL) 325 MG tablet Take 2 tablets (650 mg total) by mouth every 4 (four) hours as needed for headache or mild pain. Qty: 30 tablet, Refills: 0    aspirin 81 MG chewable tablet Chew 1 tablet (81 mg total) by mouth daily. Qty: 30 tablet, Refills: 0    famotidine (PEPCID) 20 MG tablet Take 1 tablet (20 mg total) by mouth daily. Qty: 30 tablet, Refills: 0    feeding supplement, RESOURCE BREEZE, (RESOURCE BREEZE) LIQD Take 1 Container by mouth 3 (three) times daily between meals. Qty: 30 Container, Refills: 0    furosemide (LASIX) 20 MG tablet Take 1 tablet (20 mg total) by mouth daily. Qty: 3 tablet, Refills: 0    ibuprofen (ADVIL,MOTRIN) 400 MG tablet Take 1 tablet (400 mg total) by mouth 3 (three) times daily. Qty: 30 tablet, Refills: 0    potassium chloride 20 MEQ TBCR Take 20 mEq by mouth daily. Qty: 3 tablet, Refills: 0      CONTINUE these medications which have CHANGED   Details  HYDROcodone-acetaminophen (NORCO) 10-325 MG per tablet Take 1 tablet by mouth every 6 (six) hours as needed. Qty: 30 tablet, Refills: 0   Associated Diagnoses: Multiple myeloma      CONTINUE these medications which have NOT CHANGED   Details  clopidogrel (PLAVIX) 75 MG tablet Take 75 mg by mouth daily.   Associated Diagnoses: Multiple myeloma, without mention of having achieved remission    darunavir-cobicistat (PREZCOBIX) 800-150 MG  per tablet Take 1 tablet by mouth daily. Swallow whole. Do NOT crush, break or chew tablets. Take with food. Qty: 30 tablet, Refills: 11   Associated Diagnoses: Asymptomatic HIV infection    dexamethasone (DECADRON) 4 MG tablet Take 5 tablets weekly with chemotherapy. Qty: 40 tablet, Refills: 3    emtricitabine-tenofovir (TRUVADA) 200-300 MG per tablet Take 1 tablet by mouth daily. Qty: 30 tablet, Refills: 11   Associated Diagnoses: Human immunodeficiency virus (HIV) disease    prochlorperazine (COMPAZINE) 10 MG tablet Take 1 tablet (10 mg total) by mouth every 6 (six) hours as needed for nausea or vomiting. Qty: 30 tablet, Refills: 0       No Known Allergies Follow-up Information    Follow up with [facility] [address], MD.   Specialty:  Internal Medicine   Contact information:   [address] [place] [address] [phone]        The results of significant diagnostics from this hospitalization (including imaging, microbiology, ancillary and laboratory) are listed below for reference.    Significant Diagnostic Studies: Dg Chest 2 View  02/03/2015   CLINICAL DATA:  Chronic chest pain for 1 month, with  fatigue. Initial encounter.  EXAM: CHEST  2 VIEW  COMPARISON:  Chest radiograph and CTA of the chest performed 12/19/2014  FINDINGS: The lungs are well-aerated and clear. There is no evidence of focal opacification, pleural effusion or pneumothorax. A right-sided chest port is noted ending about the distal SVC.  The heart is normal in size; a pacemaker is noted at the left chest wall, with leads ending at the right atrium and right ventricle. No acute osseous abnormalities are seen. Cervical spinal fusion hardware is partially imaged.  IMPRESSION: No acute cardiopulmonary process seen.   Electronically Signed   By: Garald Balding M.D.   On: 02/03/2015 18:22   Ct Angio Chest Pe W/cm &/or Wo Cm  02/03/2015   CLINICAL DATA:  Short of breath, back pain. Concern for  pulmonary embolism. History multiple myeloma  EXAM: CT ANGIOGRAPHY CHEST WITH CONTRAST  TECHNIQUE: Multidetector CT imaging of the chest was performed using the standard protocol during bolus administration of intravenous contrast. Multiplanar CT image reconstructions and MIPs were obtained to evaluate the vascular anatomy.  CONTRAST:  62m OMNIPAQUE IOHEXOL 350 MG/ML SOLN  COMPARISON:  CT 12/19/2014  FINDINGS: Mediastinum/Nodes: No filling defects within the pulmonary arteries to suggest acute pulmonary embolism. No acute findings aorta great vessels. No pericardial fluid. Esophagus is normal.  Lungs/Pleura:  Review of the lung parenchyma demonstrates no pulmonary edema, pleural fluid, or pneumothorax. 4 mm nodule in the right upper lobe on image 41, series 11. Not changed from comparison exam.  Upper abdomen: Consistent with given history of multiple myeloma. Normal adrenal glands.  Musculoskeletal: Are multiple lytic and sclerotic lesions throughout the sternum and spine. Seventy lesions are expansile.  Review of the MIP images confirms the above findings.  IMPRESSION: 1. No evidence acute pulmonary embolism. 2. No acute or parenchymal findings. 3. Lesions within the spine and sternum consistent with given history of multiple myeloma.   Electronically Signed   By: SSuzy BouchardM.D.   On: 02/03/2015 19:45   UKoreaAbdomen Complete  02/07/2015   CLINICAL DATA:  Chest pain x6 weeks  EXAM: ULTRASOUND ABDOMEN COMPLETE  COMPARISON:  None.  FINDINGS: Gallbladder: Layering gallbladder sludge versus non shadowing gallstones. No gallbladder wall thickening or pericholecystic fluid. Negative sonographic Murphy's sign.  Common bile duct: Diameter: 3 mm  Liver: At the upper limits of normal for parenchymal echogenicity. Irregular 8 x 8 x 9 mm hypoechoic lesion along the gallbladder fossa (image 32), possibly reflecting focal fatty sparing, although incompletely characterized.  IVC: No abnormality visualized.  Pancreas:  Poorly visualized.  Spleen: Size and appearance within normal limits.  Right Kidney: Length: 10.0 cm.  No mass or hydronephrosis.  Left Kidney: Length: 11.2 cm.  No mass or hydronephrosis.  Abdominal aorta: No aneurysm visualized.  Other findings: Bilateral pleural effusions.  IMPRESSION: Layering gallbladder sludge versus non shadowing gallstones. No associated sonographic findings to suggest acute cholecystitis.  9 mm hypoechoic lesion along the gallbladder fossa, possibly reflecting focal fatty sparing, although poorly evaluated/incompletely characterized. Given small size, follow-up MRI abdomen with/without contrast is suggested in 3 months.  Bilateral pleural effusions.   Electronically Signed   By: SJulian HyM.D.   On: 02/07/2015 17:35   Nm Hepato W/eject Fract  02/08/2015   CLINICAL DATA:  69year old male with mid abdominal pain for 7 weeks.  EXAM: NUCLEAR MEDICINE HEPATOBILIARY IMAGING WITH GALLBLADDER EF  TECHNIQUE: Sequential images of the abdomen were obtained out to 60 minutes following intravenous administration of radiopharmaceutical. After ingestion  of 8 oz of Ensure, gallbladder ejection fraction was determined.  RADIOPHARMACEUTICALS:  5.0 [place] [id number] Choletec  COMPARISON:  02/07/2015 ultrasound  FINDINGS: Prompt homogeneous hepatic activity is identified.  Gallbladder activity is identified at 20 min.  Small bowel activity is identified at 35 min.  After the administration of Ensure, gallbladder ejection fraction is calculated 6.4%. At 60 min, normal ejection fraction is greater than 33%.  The patient did not symptoms following ingestion of Ensure.  IMPRESSION: Low gallbladder ejection fraction (6.4%), which can be seen with chronic cholecystitis/ biliary dysfunction.  No evidence of cystic duct obstruction/ acute cholecystitis.   Electronically Signed   By: Margarette Canada M.D.   On: 02/08/2015 19:31   Nm Myocar Single W/spect W/wall Motion And Ef  02/04/2015   CLINICAL DATA:   69 year old with multiple myeloma, lytic sternal lesions with elevated troponin, abnormal EKG, chest pain.  EXAM: MYOCARDIAL IMAGING WITH SPECT (REST AND PHARMACOLOGIC-STRESS)  GATED LEFT VENTRICULAR WALL MOTION STUDY  LEFT VENTRICULAR EJECTION FRACTION  TECHNIQUE: Standard myocardial SPECT imaging was performed after resting intravenous injection of 10 mCi [id number]. After rest imaging, stress portion was terminated and patient was brought to the cardiac catheterization lab by Dr. TRadford Pax  COMPARISON:  None.  FINDINGS: Perfusion: There appears to be normal perfusion uptake along the lateral wall distribution however other wall segments appear diffusely mildly reduced. There are no stress images to compare.  Wall Motion:  Not performed.  Left Ventricular Ejection Fraction: n/a %  End diastolic volume n/a ml  End systolic volume n/a ml  IMPRESSION: 1. Abnormal rest images with mild reduced radiotracer uptake in all segments other than lateral wall.  2. No stress images performed. Test was not completed, patient proceeded to cardiac catheterization.   Electronically Signed   By: MCandee Furbish  On: 02/04/2015 14:27    Microbiology: No results found for this or any previous visit (from the past 240 hour(s)).   Labs: Basic Metabolic Panel:  Recent Labs Lab 02/03/15 1707 02/04/15 1335 02/05/15 0500 02/09/15 1321  NA 137 139 140 140  K 3.6 3.7 3.6 3.6  CL 105 106 107 107  CO2 26 24 27 26   GLUCOSE 110* 92 94 168*  BUN 8 6 7 7   CREATININE 0.87 0.79 0.79 0.84  CALCIUM 9.2 9.2 9.0 9.1   Liver Function Tests:  Recent Labs Lab 02/03/15 1707  AST 62*  ALT 54*  ALKPHOS 67  BILITOT 0.8  PROT 5.4*  ALBUMIN 3.5    Recent Labs Lab 02/03/15 1707  LIPASE 42   No results for input(s): AMMONIA in the last 168 hours. CBC:  Recent Labs Lab 02/03/15 1707  02/05/15 0500 02/06/15 0403 02/07/15 0320 02/08/15 0300 02/09/15 0427  WBC 4.1  < > 3.2* 2.8* 2.7* 3.5* 3.0*  NEUTROABS 2.1  --    --   --   --   --   --   HGB 10.9*  < > 10.6* 10.1* 10.1* 10.2* 9.8*  HCT 32.4*  < > 31.2* 30.0* 29.4* 30.0* 28.9*  MCV 87.6  < > 88.4 86.7 86.7 86.5 86.3  PLT 143*  < > 148* 133* 138* 150 145*  < > = values in this interval not displayed. Cardiac Enzymes:  Recent Labs Lab 02/03/15 2020 02/04/15 0024 02/04/15 0626  TROPONINI 0.32* 0.37* 0.61*   BNP: BNP (last 3 results)  Recent Labs  12/19/14 1633 02/03/15 1707  BNP 486.7* 621.5*    ProBNP (last 3 results)  No results for input(s): PROBNP in the last 8760 hours.  CBG: No results for input(s): GLUCAP in the last 168 hours.     SignedNiel Hummer A  [facility] 02/09/2015, 2:56 PM

## 2015-02-09 NOTE — Progress Notes (Signed)
VASCULAR LAB PRELIMINARY  PRELIMINARY  PRELIMINARY  PRELIMINARY  Bilateral lower extremity venous Dopplers completed.    Preliminary report:  There is no DVT or SVT noted in the bilateral lower extremities.  Waveforms are pulsatile, suggestive of fluid overload.   Akash Winski, RVT 02/09/2015, 3:26 PM

## 2015-02-09 NOTE — Progress Notes (Signed)
Progress Note   Subjective  *No complaints today**   Objective  Vital signs in last 24 hours: Temp:  [97.8 F (36.6 C)-98.4 F (36.9 C)] 98 F (36.7 C) (03/06 0537) Pulse Rate:  [80-101] 80 (03/06 0537) Resp:  [18] 18 (03/06 0537) BP: (98-125)/(66-85) 98/66 mmHg (03/06 0537) SpO2:  [95 %-100 %] 96 % (03/06 0537) Last BM Date: 02/07/15 General:   Alert,  Well-developed,   in NAD Heart:  Regular rate and rhythm; no murmurs Abdomen:  Soft, nontender and nondistended. Normal bowel sounds, without guarding, and without rebound.   Extremities:  Without edema. Neurologic:  Alert and  oriented x4;  grossly normal neurologically. Psych:  Alert and cooperative. Normal mood and affect.  Intake/Output from previous day:   Intake/Output this shift: Total I/O In: 240 [P.O.:240] Out: -   Lab Results:  Recent Labs  02/07/15 0320 02/08/15 0300 02/09/15 0427  WBC 2.7* 3.5* 3.0*  HGB 10.1* 10.2* 9.8*  HCT 29.4* 30.0* 28.9*  PLT 138* 150 145*   BMET No results for input(s): NA, K, CL, CO2, GLUCOSE, BUN, CREATININE, CALCIUM in the last 72 hours. LFT No results for input(s): PROT, ALBUMIN, AST, ALT, ALKPHOS, BILITOT, BILIDIR, IBILI in the last 72 hours. PT/INR No results for input(s): LABPROT, INR in the last 72 hours. Hepatitis Panel No results for input(s): HEPBSAG, HCVAB, HEPAIGM, HEPBIGM in the last 72 hours.  Studies/Results: US Abdomen Complete  02/07/2015   CLINICAL DATA:  Chest pain x6 weeks  EXAM: ULTRASOUND ABDOMEN COMPLETE  COMPARISON:  None.  FINDINGS: Gallbladder: Layering gallbladder sludge versus non shadowing gallstones. No gallbladder wall thickening or pericholecystic fluid. Negative sonographic Murphy's sign.  Common bile duct: Diameter: 3 mm  Liver: At the upper limits of normal for parenchymal echogenicity. Irregular 8 x 8 x 9 mm hypoechoic lesion along the gallbladder fossa (image 32), possibly reflecting focal fatty sparing, although incompletely  characterized.  IVC: No abnormality visualized.  Pancreas: Poorly visualized.  Spleen: Size and appearance within normal limits.  Right Kidney: Length: 10.0 cm.  No mass or hydronephrosis.  Left Kidney: Length: 11.2 cm.  No mass or hydronephrosis.  Abdominal aorta: No aneurysm visualized.  Other findings: Bilateral pleural effusions.  IMPRESSION: Layering gallbladder sludge versus non shadowing gallstones. No associated sonographic findings to suggest acute cholecystitis.  9 mm hypoechoic lesion along the gallbladder fossa, possibly reflecting focal fatty sparing, although poorly evaluated/incompletely characterized. Given small size, follow-up MRI abdomen with/without contrast is suggested in 3 months.  Bilateral pleural effusions.   Electronically Signed   By: Julian Hy M.D.   On: 02/07/2015 17:35   Nm Hepato W/eject Fract  02/08/2015   CLINICAL DATA:  69 year old male with mid abdominal pain for 7 weeks.  EXAM: NUCLEAR MEDICINE HEPATOBILIARY IMAGING WITH GALLBLADDER EF  TECHNIQUE: Sequential images of the abdomen were obtained out to 60 minutes following intravenous administration of radiopharmaceutical. After ingestion of 8 oz of Ensure, gallbladder ejection fraction was determined.  RADIOPHARMACEUTICALS:  5.0 Millicurie JT-70V Choletec  COMPARISON:  02/07/2015 ultrasound  FINDINGS: Prompt homogeneous hepatic activity is identified.  Gallbladder activity is identified at 20 min.  Small bowel activity is identified at 35 min.  After the administration of Ensure, gallbladder ejection fraction is calculated 6.4%. At 60 min, normal ejection fraction is greater than 33%.  The patient did not symptoms following ingestion of Ensure.  IMPRESSION: Low gallbladder ejection fraction (6.4%), which can be seen with chronic cholecystitis/ biliary dysfunction.  No evidence of  cystic duct obstruction/ acute cholecystitis.   Electronically Signed   By: Margarette Canada M.D.   On: 02/08/2015 19:31      Assessment & Plan    *Chest pain.  Although HIDA scan demonstrates a decreased ejection fraction I am not convinced that chest pain is due to chronic cholecystitis.  Pain is more likely musculoskeletal.  Would not pursue any further at this time. ** Active Problems:   HIV disease   Multiple myeloma in relapse   Pacemaker   NSTEMI (non-ST elevated myocardial infarction)   Chest pain   Angina pectoris   Elevated troponin   Protein-calorie malnutrition, severe   Abdominal pain     LOS: 6 days   Erskine Emery  02/09/2015, 11:33 AM

## 2015-02-10 ENCOUNTER — Encounter (HOSPITAL_COMMUNITY): Payer: Self-pay | Admitting: Gastroenterology

## 2015-02-11 DIAGNOSIS — C9000 Multiple myeloma not having achieved remission: Secondary | ICD-10-CM | POA: Insufficient documentation

## 2015-02-13 ENCOUNTER — Ambulatory Visit: Payer: Medicare Other

## 2015-02-13 ENCOUNTER — Other Ambulatory Visit: Payer: Medicare Other

## 2015-02-20 ENCOUNTER — Other Ambulatory Visit: Payer: Medicare Other

## 2015-02-20 ENCOUNTER — Ambulatory Visit: Payer: Medicare Other

## 2015-02-27 ENCOUNTER — Ambulatory Visit: Payer: Medicare Other | Admitting: Oncology

## 2015-02-27 ENCOUNTER — Ambulatory Visit: Payer: Medicare Other

## 2015-02-27 ENCOUNTER — Other Ambulatory Visit: Payer: Medicare Other

## 2015-03-07 ENCOUNTER — Telehealth: Payer: Self-pay | Admitting: Oncology

## 2015-03-07 ENCOUNTER — Other Ambulatory Visit: Payer: Self-pay | Admitting: *Deleted

## 2015-03-07 ENCOUNTER — Ambulatory Visit (HOSPITAL_BASED_OUTPATIENT_CLINIC_OR_DEPARTMENT_OTHER): Payer: Medicare Other | Admitting: Oncology

## 2015-03-07 VITALS — BP 103/65 | HR 98 | Temp 98.4°F | Resp 19 | Ht 69.0 in | Wt 133.4 lb

## 2015-03-07 DIAGNOSIS — Z21 Asymptomatic human immunodeficiency virus [HIV] infection status: Secondary | ICD-10-CM

## 2015-03-07 DIAGNOSIS — I319 Disease of pericardium, unspecified: Secondary | ICD-10-CM

## 2015-03-07 DIAGNOSIS — C9002 Multiple myeloma in relapse: Secondary | ICD-10-CM

## 2015-03-07 MED ORDER — POMALIDOMIDE 2 MG PO CAPS: 2.0000 mg | ORAL_CAPSULE | Freq: Every day | ORAL | Status: DC

## 2015-03-07 MED ORDER — OXYCODONE-ACETAMINOPHEN 5-325 MG PO TABS: 1.0000 | ORAL_TABLET | ORAL | Status: DC | PRN

## 2015-03-07 NOTE — Progress Notes (Signed)
Hematology and Oncology Follow Up Visit  Rick Potter [id number] 01/23/1946 69 y.o. 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 He presented with lytic bone lesions and 84% plasma infiltration in his bone marrow. No heavy chain was detected. He also has HIV disease under control.  Prior Therapy: He was treated in the [facility] area initially with a Velcade-based regimen and had an excellent response with about 4% bone marrow residual disease. In April of 2014 he developed relapsed and was treated with Revlimid, Velcade and dexamethasone regimen. He had an excellent response with his kappa free light chain close to 60 mg/dL. He is planned to have maintenance Velcade treatment even his poor tolerance to Revlimid. He recently relocated to [place] to be close to family.   Current therapy: Velcade and dexamethasone salvage chemotherapy started on 12/24/2014. Therapy discontinued in March 2016 because of poor tolerance.  Interim History:  Rick Potter presents today for a followup visit. Since his last visit, he was hospitalized for pericarditis and chest pain. He underwent catheterization and showed nonocclusive coronary artery disease and currently on medical treatment. He has felt better since off of Velcade especially when it comes time to his peripheral neuropathy, abdominal pain and chest pain. He continues to have generalized weakness but his weight is slightly improved. His quality of life has actually improved since discontinuation of Velcade.   He Is not reporting any headaches or blurry vision or syncope. Is not reporting any shortness of breath or cough or hemoptysis. Does not report any nausea or vomiting abdominal pain. Has not reported any hematochezia or melena. He reports no frequency urgency or hesitancy.  Rest of his review of systems  unremarkable.  Medications: I have reviewed the patient's current medications.  Current Outpatient Prescriptions  Medication Sig Dispense Refill  . ibuprofen (ADVIL,MOTRIN) 400 MG tablet Take 1 tablet (400 mg total) by mouth 3 (three) times daily. 30 tablet 0  . KLOR-CON M10 10 MEQ tablet Take 10 mEq by mouth daily.  4  . oxyCODONE-acetaminophen (PERCOCET/ROXICET) 5-325 MG per tablet Take 1 tablet by mouth every 4 (four) hours as needed.  0  . acetaminophen (TYLENOL) 325 MG tablet Take 2 tablets (650 mg total) by mouth every 4 (four) hours as needed for headache or mild pain. 30 tablet 0  . aspirin 81 MG chewable tablet Chew 1 tablet (81 mg total) by mouth daily. 30 tablet 0  . clopidogrel (PLAVIX) 75 MG tablet Take 75 mg by mouth daily.    . darunavir-cobicistat (PREZCOBIX) 800-150 MG per tablet Take 1 tablet by mouth daily. Swallow whole. Do NOT crush, break or chew tablets. Take with food. 30 tablet 11  . dexamethasone (DECADRON) 4 MG tablet Take 5 tablets weekly with chemotherapy. 40 tablet 3  . emtricitabine-tenofovir (TRUVADA) 200-300 MG per tablet Take 1 tablet by mouth daily. 30 tablet 11  . pomalidomide (POMALYST) 2 MG capsule Take 1 capsule (2 mg total) by mouth daily. Take with water on days 1-21. Repeat every 28 days. 21 capsule 0   No current facility-administered medications for this visit.     Allergies: No Known Allergies  Past Medical History, Surgical history, Social history, and Family History were reviewed and updated.   Physical Exam: Blood pressure 103/65, pulse 98, temperature 98.4 F (36.9 C), temperature source Oral, resp. rate 19, height 5' 9" (1.753 m), weight 133 lb 6.4 oz (60.51 kg),  SpO2 100 %. ECOG: 1 General appearance: alert and cooperative did not appear any distress. Head: Normocephalic, without obvious abnormality Neck: no adenopathy Lymph nodes: Cervical, supraclavicular, and axillary nodes normal. Heart:regular rate and rhythm, S1, S2 normal, no  murmur, click, rub or gallop  Chest wall examination: no masses or lesions. No reproducible chest pain today.  Lung:chest clear, no wheezing, rales, normal symmetric air entry Abdomin: soft, non-tender, without masses or organomegaly EXT:no erythema, induration, or nodules   Lab Results: Lab Results  Component Value Date   WBC 3.0* 02/09/2015   HGB 9.8* 02/09/2015   HCT 28.9* 02/09/2015   MCV 86.3 02/09/2015   PLT 145* 02/09/2015     Chemistry      Component Value Date/Time   NA 140 02/09/2015 1321   NA 140 01/29/2015 1401   K 3.6 02/09/2015 1321   K 3.8 01/29/2015 1401   CL 107 02/09/2015 1321   CO2 26 02/09/2015 1321   CO2 24 01/29/2015 1401   BUN 7 02/09/2015 1321   BUN 8.1 01/29/2015 1401   CREATININE 0.84 02/09/2015 1321   CREATININE 0.7 01/29/2015 1401   CREATININE 0.91 12/09/2014 1100      Component Value Date/Time   CALCIUM 9.1 02/09/2015 1321   CALCIUM 9.0 01/29/2015 1401   ALKPHOS 67 02/03/2015 1707   ALKPHOS 55 01/29/2015 1401   AST 62* 02/03/2015 1707   AST 22 01/29/2015 1401   ALT 54* 02/03/2015 1707   ALT 33 01/29/2015 1401   BILITOT 0.8 02/03/2015 1707   BILITOT 0.75 01/29/2015 1401             Impression and Plan:  68-year-old gentleman with the following issues:   1. 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 He is status post of Velcade-based regimen and subsequently received Velcade with Revlimid and tolerated it poorly.   He was treated with Velcade and dexamethasone between January and March 2016 and therapy was discontinued due to poor tolerance. Options of treatment were discussed today including using different salvage regimens. These would include Pomalyst based versus Kyprolis. The risks and benefits and complications of these medications were discussed today and he is agreeable to try Pomalyst. We will start at a lower dose of 2 mg daily for 21 days with 1 week off. He tolerates his current dose was titrated  up to the full dose of 4 mg. Complications include nausea, vomiting, constipation, peripheral neuropathy were discussed today is agreeable to proceed.   2. HIV disease: Under reasonable control followed by infectious disease.  3. Port-A-Cath management: this will be flushed every 6 weeks.   4. Chest pain: attributed to pericarditis and seems to be improving.   5. Bone directed therapy: we will initiate Zometa with future visits once his other issues stabilize.  6. Follow-up: In 4 weeks to assess his tolerance to this chemotherapy regimen.  SHADAD,FIRAS, MD 4/1/20169:10 AM 

## 2015-03-07 NOTE — Telephone Encounter (Signed)
Gave avs & calendar for May. °

## 2015-03-07 NOTE — Telephone Encounter (Signed)
Information given on pomalyst and patient signed and initialed patient - physician agreement form. Script faxed to [facility]

## 2015-03-07 NOTE — Addendum Note (Signed)
Addended by: Randolm Idol on: 03/07/2015 09:42 AM   Modules accepted: Medications

## 2015-03-10 ENCOUNTER — Telehealth: Payer: Self-pay | Admitting: *Deleted

## 2015-03-10 NOTE — Telephone Encounter (Signed)
CODE GIVEN TO [place].

## 2015-03-18 ENCOUNTER — Ambulatory Visit: Payer: Medicare Other | Admitting: Internal Medicine

## 2015-03-26 ENCOUNTER — Telehealth: Payer: Self-pay

## 2015-03-26 NOTE — Telephone Encounter (Signed)
[employer] request for prescriber survey for pomalyst be completed to Dr Hazeline Junker nurse

## 2015-03-29 IMAGING — DX DG CHEST 2V
2 series · 2 of 2 positions shown · non-contrast
Comparison: Chest radiograph and CTA of the chest performed
12/19/2014

CLINICAL DATA: Chronic chest pain for 1 month, with fatigue.
Initial encounter.

EXAM:
CHEST  2 VIEW

[chest pa]
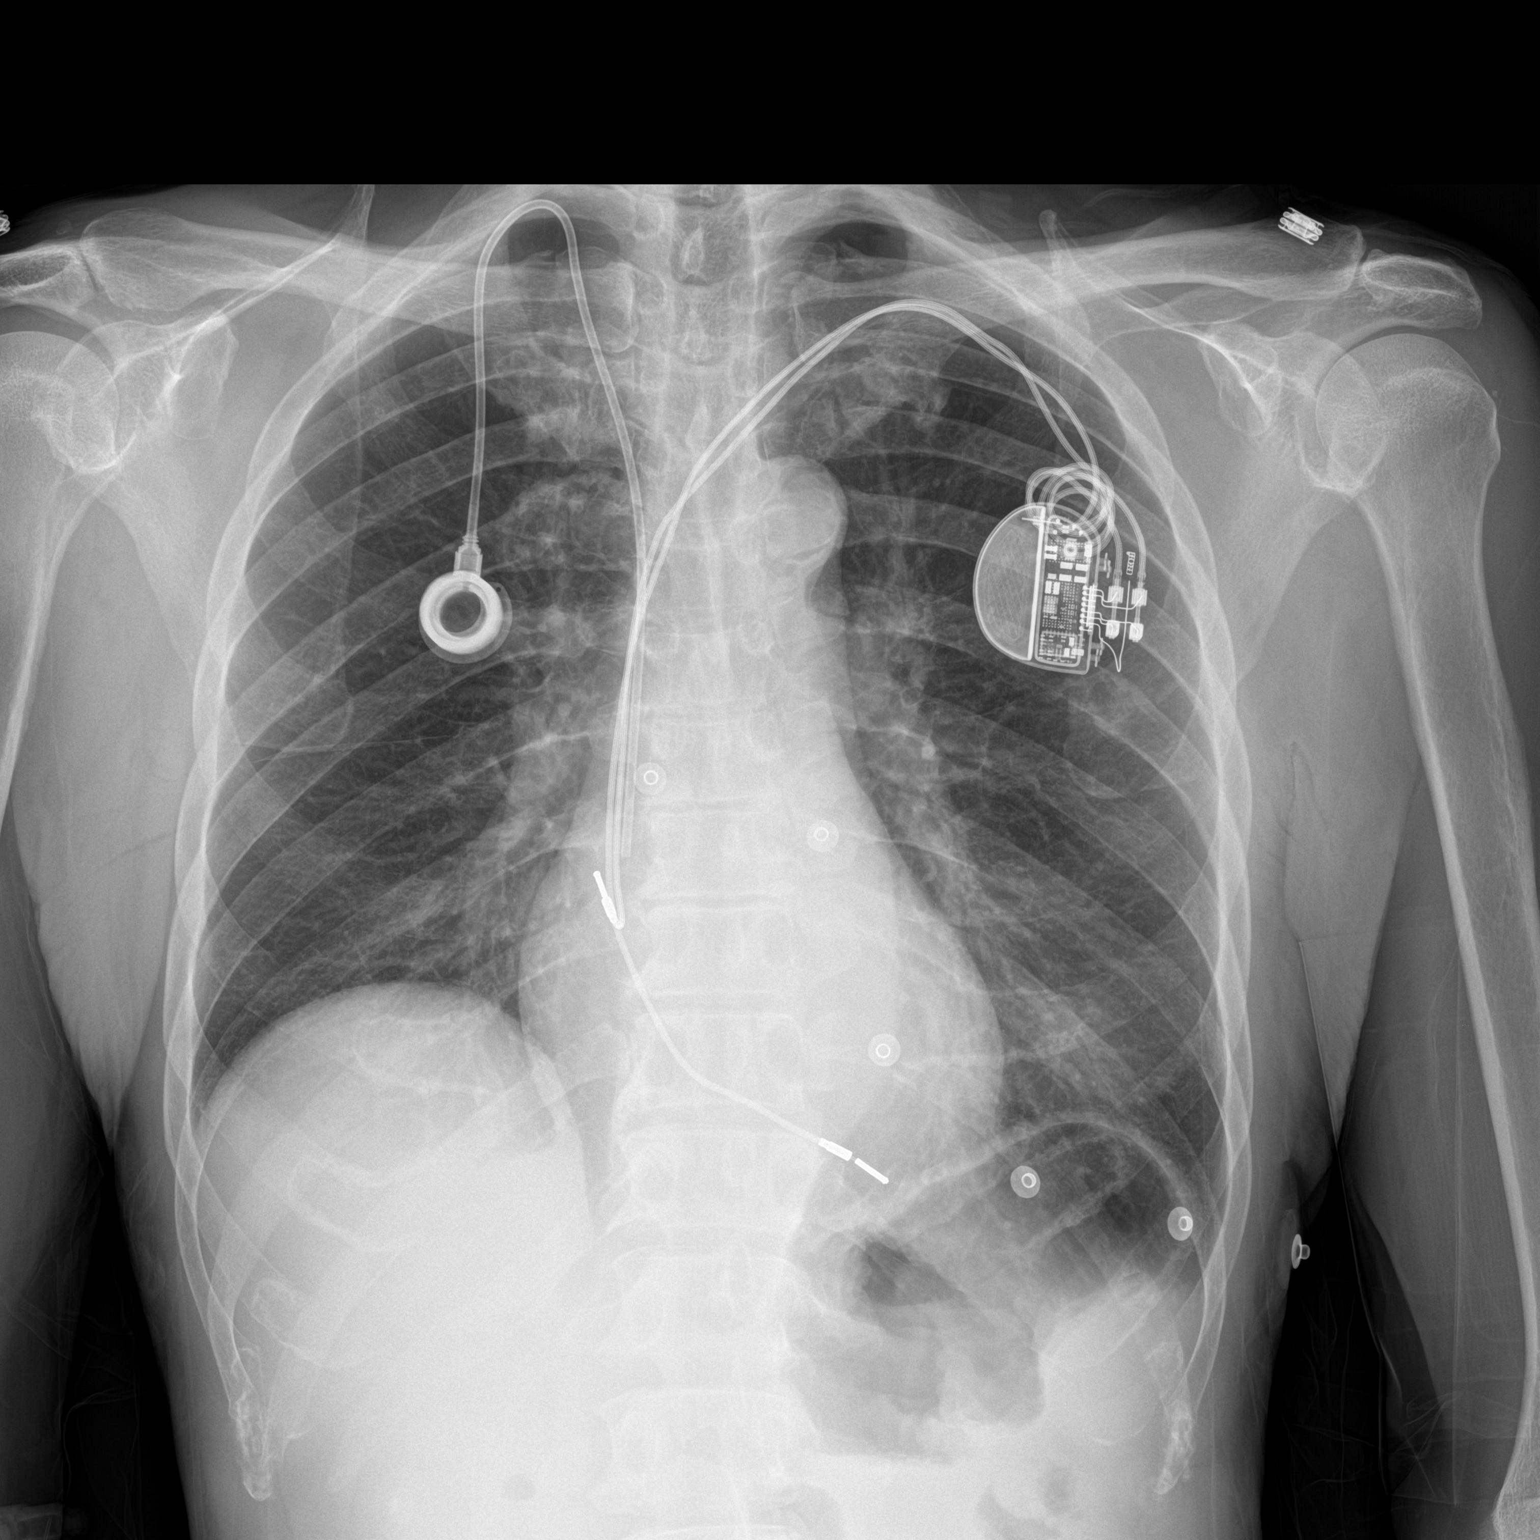

[chest lat]
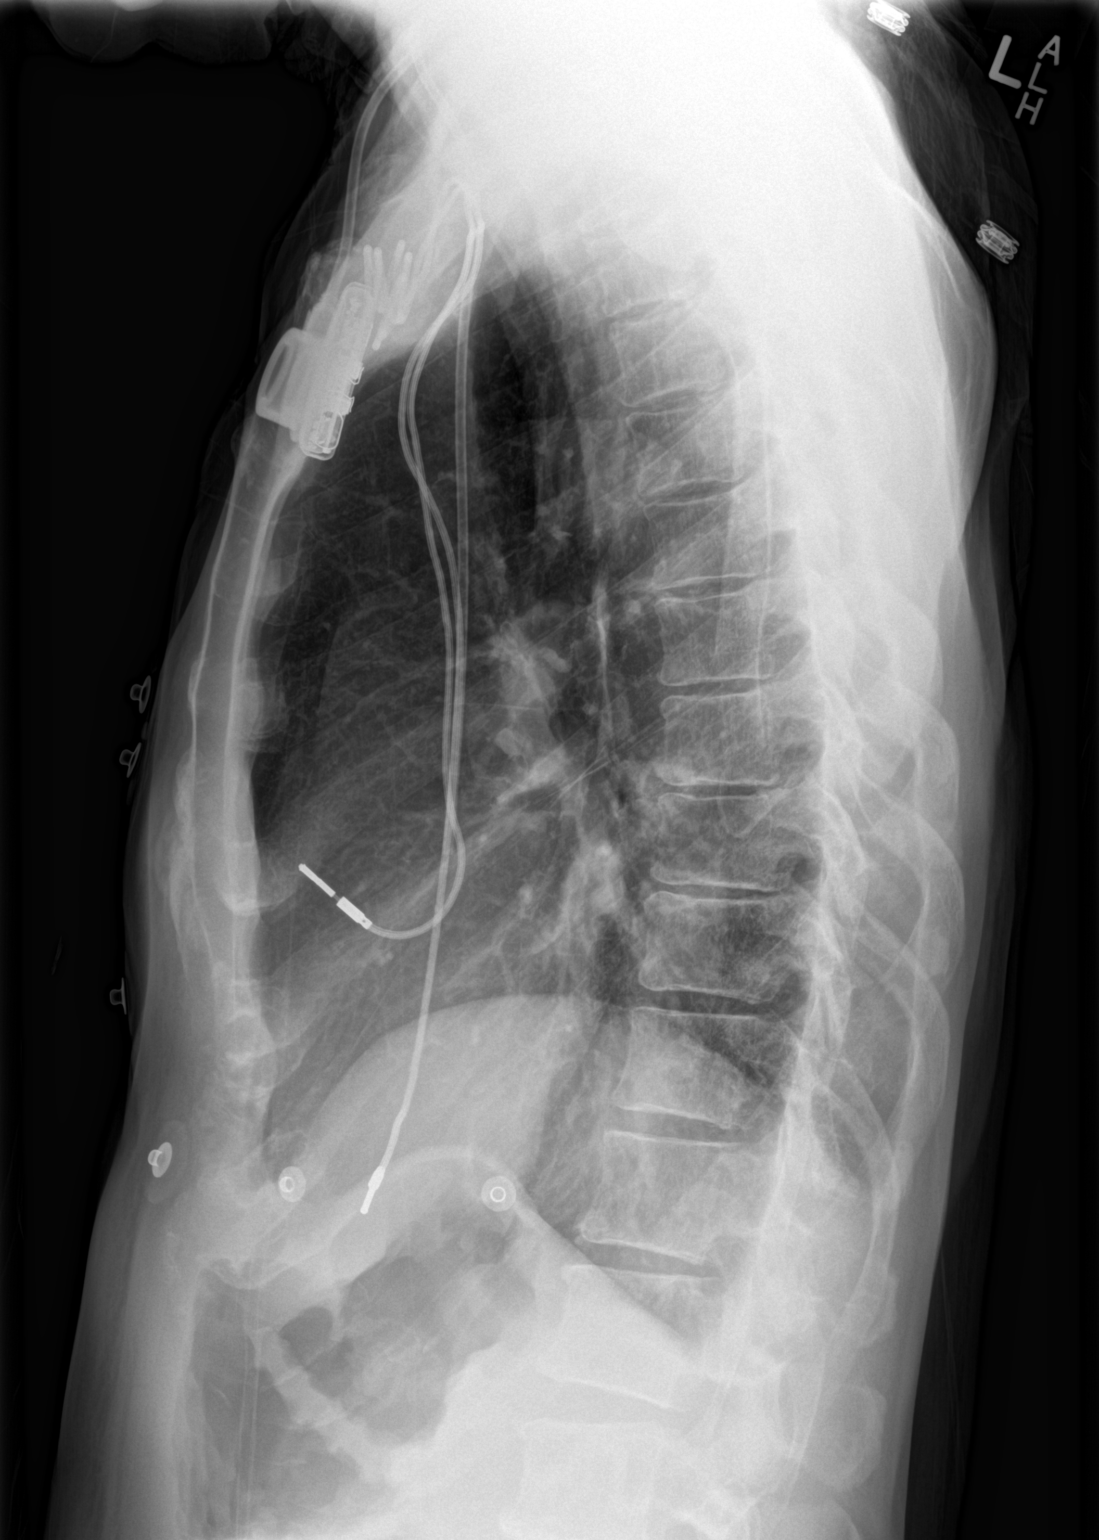

[2 of 2 positions shown; findings below may reference images not displayed]

FINDINGS: The lungs are well-aerated and clear. There is no evidence of focal
opacification, pleural effusion or pneumothorax. A right-sided chest
port is noted ending about the distal SVC.

The heart is normal in size; a pacemaker is noted at the left chest
wall, with leads ending at the right atrium and right ventricle. No
acute osseous abnormalities are seen. Cervical spinal fusion
hardware is partially imaged.
IMPRESSION: No acute cardiopulmonary process seen.

## 2015-04-03 IMAGING — NM NM HEPATO W/GB/PHARM/[PERSON_NAME]
3 series · 13 of 13 positions shown · non-contrast
Comparison: 02/07/2015 ultrasound

CLINICAL DATA: 68-year-old male with mid abdominal pain for 7
weeks.

EXAM:
NUCLEAR MEDICINE HEPATOBILIARY IMAGING WITH GALLBLADDER EF
TECHNIQUE: Sequential images of the abdomen were obtained [DATE] minutes
following intravenous administration of radiopharmaceutical. After
ingestion of 8 oz of Ensure, gallbladder ejection fraction was
determined.
RADIOPHARMACEUTICALS:  5.0 Millicurie Hc-MMm Choletec

[he hepatobiliary · 3.43mm/px · 6 of 60 frames shown (1 of 3)]
[frame 6/60]
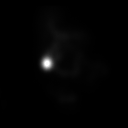
[frame 16/60]
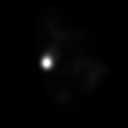
[frame 26/60]
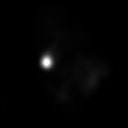
[frame 36/60]
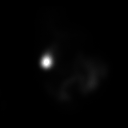
[frame 46/60]
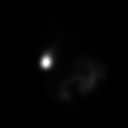
[frame 56/60]
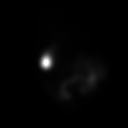

[he hepatobiliary · 3.43mm/px · 6 of 51 frames shown (2 of 3)]
[frame 5/51]
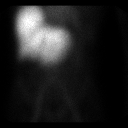
[frame 13/51]
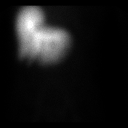
[frame 22/51]
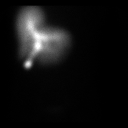
[frame 30/51]
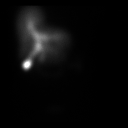
[frame 39/51]
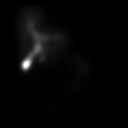
[frame 47/51]
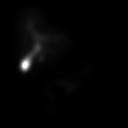

[he hepatobiliary · 1 of 1 slices shown (3 of 3)]
[im 1/1]
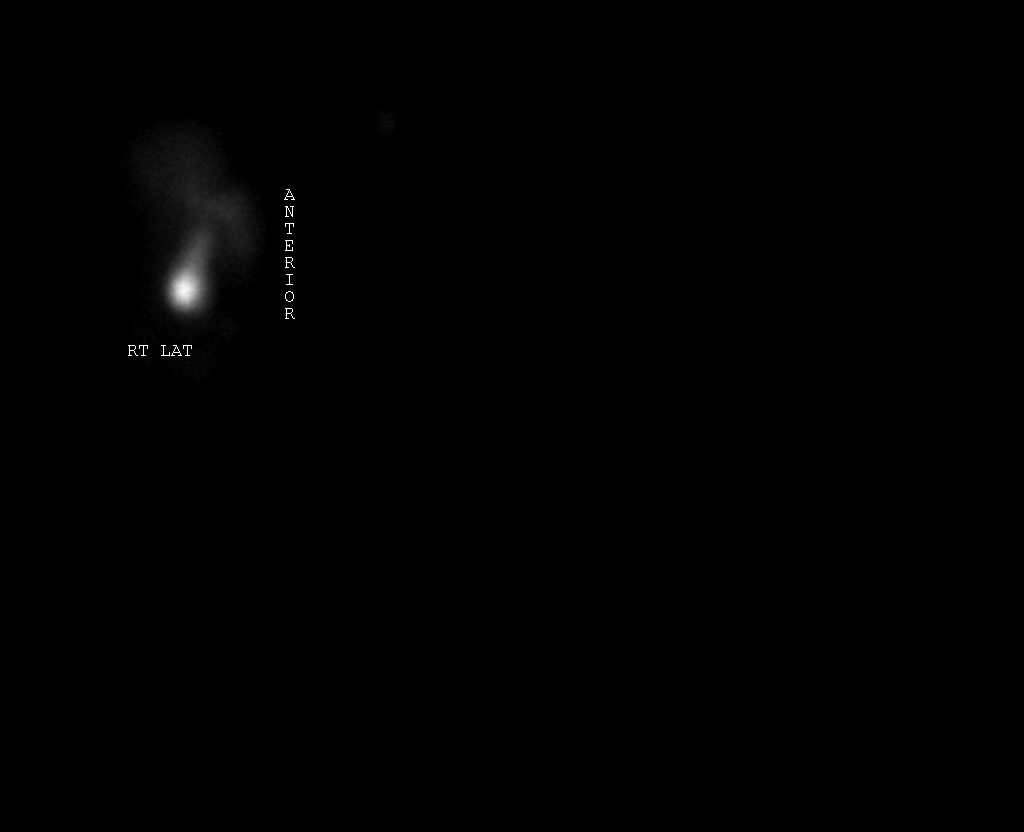

[13 of 13 positions shown; findings below may reference images not displayed]

FINDINGS: Prompt homogeneous hepatic activity is identified.

Gallbladder activity is identified at 20 min.

Small bowel activity is identified at 35 min.

After the administration of Ensure, gallbladder ejection fraction is
calculated 6.4%.. At 60 min, normal ejection fraction is greater
than 33%.

The patient did not symptoms following ingestion of Ensure.
IMPRESSION: Low gallbladder ejection fraction (6.4%), which can be seen with
chronic cholecystitis/ biliary dysfunction.

No evidence of cystic duct obstruction/ acute cholecystitis.

## 2015-04-07 ENCOUNTER — Ambulatory Visit: Payer: Medicare Other | Admitting: Gastroenterology

## 2015-04-08 ENCOUNTER — Telehealth: Payer: Self-pay | Admitting: *Deleted

## 2015-04-08 ENCOUNTER — Other Ambulatory Visit: Payer: Self-pay | Admitting: *Deleted

## 2015-04-08 DIAGNOSIS — C9002 Multiple myeloma in relapse: Secondary | ICD-10-CM

## 2015-04-08 DIAGNOSIS — C9000 Multiple myeloma not having achieved remission: Secondary | ICD-10-CM

## 2015-04-08 MED ORDER — OXYCODONE-ACETAMINOPHEN 5-325 MG PO TABS: 1.0000 | ORAL_TABLET | ORAL | Status: DC | PRN

## 2015-04-08 MED ORDER — POMALIDOMIDE 2 MG PO CAPS: ORAL_CAPSULE | ORAL | Status: DC

## 2015-04-08 NOTE — Telephone Encounter (Signed)
This RN called and left a message on patient's home phone that he needs to take his patient survey for Pomalyst.

## 2015-04-08 NOTE — Telephone Encounter (Signed)
Pt left message stating he needs a refill on hydrocodone

## 2015-04-09 ENCOUNTER — Telehealth: Payer: Self-pay | Admitting: *Deleted

## 2015-04-09 NOTE — Telephone Encounter (Signed)
Received call from Lanny Hurst, pharmacist @ [facility] on [address] re:  Pt was given Percocet rx by Dr. Alen Blew, and also  Hydrocodone - APAP  10-325 by Dr. Ashby Dawes.  Lanny Hurst stated pt had already picked up Hydrocodone meds today.  Lanny Hurst questioned about Oxycodone and if Dr. Alen Blew would want to give this med anyway.  Asked Lanny Hurst to fax pt's medication list to triage nurse for review along with copy of Oxycodone rx. Dr. Alen Blew notified.  Per Dr. Alen Blew, discontinue Oxycodone rx for now. Called Lanny Hurst back , and informed pharmacist of Dr. Hazeline Junker decision. Lanny Hurst, pharmacist  Phone    [phone].

## 2015-04-11 ENCOUNTER — Other Ambulatory Visit: Payer: Medicare Other

## 2015-04-11 ENCOUNTER — Ambulatory Visit: Payer: Medicare Other | Admitting: Oncology

## 2015-04-14 ENCOUNTER — Emergency Department (HOSPITAL_COMMUNITY): Payer: Medicare Other

## 2015-04-14 ENCOUNTER — Emergency Department (HOSPITAL_COMMUNITY)
Admission: EM | Admit: 2015-04-14 | Discharge: 2015-04-15 | Disposition: A | Discharge status: Home / Self Care | Payer: Medicare Other | Attending: Emergency Medicine | Admitting: Emergency Medicine

## 2015-04-14 ENCOUNTER — Encounter (HOSPITAL_COMMUNITY): Payer: Self-pay | Admitting: Emergency Medicine

## 2015-04-14 DIAGNOSIS — Z8673 Personal history of transient ischemic attack (TIA), and cerebral infarction without residual deficits: Secondary | ICD-10-CM | POA: Insufficient documentation

## 2015-04-14 DIAGNOSIS — Z950 Presence of cardiac pacemaker: Secondary | ICD-10-CM | POA: Insufficient documentation

## 2015-04-14 DIAGNOSIS — Z7902 Long term (current) use of antithrombotics/antiplatelets: Secondary | ICD-10-CM | POA: Diagnosis not present

## 2015-04-14 DIAGNOSIS — G8929 Other chronic pain: Secondary | ICD-10-CM | POA: Diagnosis not present

## 2015-04-14 DIAGNOSIS — Z79899 Other long term (current) drug therapy: Secondary | ICD-10-CM | POA: Diagnosis not present

## 2015-04-14 DIAGNOSIS — Z7982 Long term (current) use of aspirin: Secondary | ICD-10-CM | POA: Diagnosis not present

## 2015-04-14 DIAGNOSIS — Z21 Asymptomatic human immunodeficiency virus [HIV] infection status: Secondary | ICD-10-CM | POA: Insufficient documentation

## 2015-04-14 DIAGNOSIS — Z8639 Personal history of other endocrine, nutritional and metabolic disease: Secondary | ICD-10-CM | POA: Insufficient documentation

## 2015-04-14 DIAGNOSIS — Z87891 Personal history of nicotine dependence: Secondary | ICD-10-CM | POA: Diagnosis not present

## 2015-04-14 DIAGNOSIS — Z8579 Personal history of other malignant neoplasms of lymphoid, hematopoietic and related tissues: Secondary | ICD-10-CM | POA: Insufficient documentation

## 2015-04-14 DIAGNOSIS — R079 Chest pain, unspecified: Secondary | ICD-10-CM | POA: Insufficient documentation

## 2015-04-14 LAB — CBC WITH DIFFERENTIAL/PLATELET
Basophils Absolute: 0 10*3/uL (ref 0.0–0.1)
Basophils Relative: 1 % (ref 0–1)
Eosinophils Absolute: 0 10*3/uL (ref 0.0–0.7)
Eosinophils Relative: 1 % (ref 0–5)
HCT: 36.6 % — ABNORMAL LOW (ref 39.0–52.0)
Hemoglobin: 12.3 g/dL — ABNORMAL LOW (ref 13.0–17.0)
Lymphocytes Relative: 55 % — ABNORMAL HIGH (ref 12–46)
Lymphs Abs: 1.7 10*3/uL (ref 0.7–4.0)
MCH: 28.7 pg (ref 26.0–34.0)
MCHC: 33.6 g/dL (ref 30.0–36.0)
MCV: 85.5 fL (ref 78.0–100.0)
Monocytes Absolute: 0.3 10*3/uL (ref 0.1–1.0)
Monocytes Relative: 11 % (ref 3–12)
Neutro Abs: 1 10*3/uL — ABNORMAL LOW (ref 1.7–7.7)
Neutrophils Relative %: 32 % — ABNORMAL LOW (ref 43–77)
Platelets: 165 10*3/uL (ref 150–400)
RBC: 4.28 MIL/uL (ref 4.22–5.81)
RDW: 12.9 % (ref 11.5–15.5)
WBC: 3 10*3/uL — ABNORMAL LOW (ref 4.0–10.5)

## 2015-04-14 LAB — BASIC METABOLIC PANEL
Anion gap: 11 (ref 5–15)
BUN: 13 mg/dL (ref 6–20)
CO2: 25 mmol/L (ref 22–32)
Calcium: 10.2 mg/dL (ref 8.9–10.3)
Chloride: 105 mmol/L (ref 101–111)
Creatinine, Ser: 1.08 mg/dL (ref 0.61–1.24)
GFR calc Af Amer: >60 mL/min (ref >60)
GFR calc non Af Amer: >60 mL/min (ref >60)
Glucose, Bld: 103 mg/dL — ABNORMAL HIGH (ref 70–99)
Potassium: 3.8 mmol/L (ref 3.5–5.1)
Sodium: 141 mmol/L (ref 135–145)

## 2015-04-14 LAB — TROPONIN I: Troponin I: 0.47 ng/mL — ABNORMAL HIGH (ref <0.031)

## 2015-04-14 MED ORDER — MORPHINE SULFATE 4 MG/ML IJ SOLN
4.0000 mg | Freq: Once | INTRAMUSCULAR | Status: AC
  Administered 2015-04-14: 4 mg via INTRAVENOUS
  Filled 2015-04-14: qty 1

## 2015-04-14 NOTE — ED Notes (Signed)
Per EMS- pt has chronic chest pain, substernal radiating to back. Hx of chronic chest pain, has been treated in the past with narcotic medication, per pt over last 2 months pain has gotten worse. Pt had cath done in March, which was negative. Now over past month, increase in sob and chest pain, as well as increase in fatigue and generalized weakness. Alert and oriented. Hx of multiple myeloma, not currently on chemo. PCP thinks his symptoms are r/t cancer.

## 2015-04-14 NOTE — ED Provider Notes (Signed)
Medical screening examination/treatment/procedure(s) were conducted as a shared visit with non-physician practitioner(s) and myself.  I personally evaluated the patient during the encounter.   EKG Interpretation   Date/Time:  Monday Apr 14 2015 17:35:40 EDT Ventricular Rate:  102 PR Interval:  163 QRS Duration: 156 QT Interval:  401 QTC Calculation: 522 R Axis:   -93 Text Interpretation:  Sinus tachycardia RBBB and LAFB No significant  change since last tracing Confirmed by Carinna Newhart  MD, Jayson Waterhouse [phone]) on  04/14/2015 5:49:15 PM      Results for orders placed or performed during the hospital encounter of 04/14/15  Troponin I  Result Value Ref Range   Troponin I 0.47 (H) <0.031 ng/mL  CBC with Differential  Result Value Ref Range   WBC 3.0 (L) 4.0 - 10.5 K/uL   RBC 4.28 4.22 - 5.81 MIL/uL   Hemoglobin 12.3 (L) 13.0 - 17.0 g/dL   HCT 36.6 (L) 39.0 - 52.0 %   MCV 85.5 78.0 - 100.0 fL   MCH 28.7 26.0 - 34.0 pg   MCHC 33.6 30.0 - 36.0 g/dL   RDW 12.9 11.5 - 15.5 %   Platelets 165 150 - 400 K/uL   Neutrophils Relative % 32 (L) 43 - 77 %   Lymphocytes Relative 55 (H) 12 - 46 %   Monocytes Relative 11 3 - 12 %   Eosinophils Relative 1 0 - 5 %   Basophils Relative 1 0 - 1 %   Neutro Abs 1.0 (L) 1.7 - 7.7 K/uL   Lymphs Abs 1.7 0.7 - 4.0 K/uL   Monocytes Absolute 0.3 0.1 - 1.0 K/uL   Eosinophils Absolute 0.0 0.0 - 0.7 K/uL   Basophils Absolute 0.0 0.0 - 0.1 K/uL   WBC Morphology ATYPICAL LYMPHOCYTES   Basic metabolic panel  Result Value Ref Range   Sodium 141 135 - 145 mmol/L   Potassium 3.8 3.5 - 5.1 mmol/L   Chloride 105 101 - 111 mmol/L   CO2 25 22 - 32 mmol/L   Glucose, Bld 103 (H) 70 - 99 mg/dL   BUN 13 6 - 20 mg/dL   Creatinine, Ser 1.08 0.61 - 1.24 mg/dL   Calcium 10.2 8.9 - 10.3 mg/dL   GFR calc non Af Amer >60 >60 mL/min   GFR calc Af Amer >60 >60 mL/min   Anion gap 11 5 - 15   Dg Chest 2 View  04/14/2015   CLINICAL DATA:  Chronic chest pain, with worsening  dyspnea and pain over the past month.  EXAM: CHEST  2 VIEW  COMPARISON:  02/03/2015  FINDINGS: There is a right jugular Port-A-Cath with tip in the low SVC. There are intact appearances of the transvenous pacing leads.  There is unchanged moderate right hemidiaphragm elevation. The lungs are clear. The pulmonary vasculature is normal. There are no pleural effusions. Hilar, mediastinal and cardiac contours are unremarkable and unchanged.  IMPRESSION: No active cardiopulmonary disease.   Electronically Signed   By: Andreas Newport M.D.   On: 04/14/2015 19:53    Patient seen by me. Patient with chest pain on and off really since January. This resulted in a cardiac cath being done on March 1 by Dr. [place]. Showed only mild coronary artery disease. Patient has had somewhat chronically elevated troponins. But they've always been less than 1. Patient has not had anything new or acute or worse today. 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 Patient does stable for discharge home and the follow-up of with cardiology and also primary care physician.  Lungs are clear bilaterally. The heart to the regular rate and rhythm no distinct murmur. Rate upper range of normal but less than 100. Chest x-ray here today without any acute findings. Troponin is at 0.47.  Fredia Sorrow, MD 04/14/15 [phone]

## 2015-04-14 NOTE — ED Notes (Signed)
Pt report fatigue and loss of appetite

## 2015-04-15 MED ORDER — TRAMADOL-ACETAMINOPHEN 37.5-325 MG PO TABS: 1.0000 | ORAL_TABLET | Freq: Four times a day (QID) | ORAL | Status: DC | PRN

## 2015-04-15 NOTE — Discharge Instructions (Signed)
Return here as needed.  Follow-up with your primary care doctor and cardiologist

## 2015-04-15 NOTE — ED Provider Notes (Signed)
CSN: [id number]     Arrival date & time 04/14/15  22 History   First MD Initiated Contact with Patient 04/14/15 1731     Chief Complaint  Patient presents with  . Chest Pain     (Consider location/radiation/quality/duration/timing/severity/associated sxs/prior Treatment) HPI Patient presents to the emergency department with a 4 month history of constant chest pain.  The patient states that the chest pain never totally leaves it does get better or worse at times.  The patient states that he has taken the medications prescribed from his multiple ER visits in the past.  Patient states that he has been admitted for this chest pain and they have.there are workup without any definitive diagnosis.  The patient had a recent heart catheterization that did not show any abnormality other than mild nonocclusive disease.  Patient states that he does not have any shortness of breath, diaphoresis, weakness, dizziness, headache, blurred vision, back pain, neck pain, cough, runny nose, sore throat, fever, near syncope or syncope. Past Medical History  Diagnosis Date  . HIV infection dx'd 1990    Viral load undetectable in January with CD4 count of 340  . Hyperlipidemia     Patient denies this hx on 02/04/2015  . Presence of permanent cardiac pacemaker   . TIA (transient ischemic attack) ~ 2005  . Borderline type 2 diabetes mellitus     "Borderline" under surveillance, no medical therapy  . Multiple myeloma dx'd 2011   Past Surgical History  Procedure Laterality Date  . Cervical laminectomy  ~ 2012  . Insert / replace / remove pacemaker  ~ 2003  . Inguinal hernia repair Bilateral ~ 1999  . Hernia repair  ~ 1999    UHR  . Umbilical hernia repair    . Mediport insertion, single Right ~ 2012  . Left heart catheterization with coronary angiogram N/A 02/04/2015    Procedure: LEFT HEART CATHETERIZATION WITH CORONARY ANGIOGRAM;  Surgeon: Peter M Martinique, MD;  Location: [facility] CATH LAB;  Service: Cardiovascular;   Laterality: N/A;  . Esophagogastroduodenoscopy N/A 02/07/2015    Procedure: ESOPHAGOGASTRODUODENOSCOPY (EGD);  Surgeon: Inda Castle, MD;  Location: [facility];  Service: Endoscopy;  Laterality: N/A;   Family History  Problem Relation Age of Onset  . Stroke Mother   . Cancer Sister   . Diabetes Brother   . Alcohol abuse Brother    History  Substance Use Topics  . Smoking status: Former Smoker -- 0.50 packs/day for 35 years    Types: Cigarettes    Quit date: 12/06/1997  . Smokeless tobacco: Never Used  . Alcohol Use: No    Review of Systems All other systems negative except as documented in the HPI. All pertinent positives and negatives as reviewed in the HPI.   Allergies  Review of patient's allergies indicates no known allergies.  Home Medications   Prior to Admission medications   Medication Sig Start Date End Date Taking? Authorizing Provider  atazanavir-cobicistat (EVOTAZ) 300-150 MG per tablet Take 1 tablet by mouth daily. Swallow whole. Do NOT crush, cut or chew tablet. Take with food.   Yes Historical Provider, MD  clopidogrel (PLAVIX) 75 MG tablet Take 75 mg by mouth daily.   Yes Historical Provider, MD  emtricitabine-tenofovir (TRUVADA) 200-300 MG per tablet Take 1 tablet by mouth daily. 09/02/14  Yes Carlyle Basques, MD  HYDROcodone-acetaminophen [facility]) 10-325 MG per tablet Take 1 tablet by mouth 2 (two) times daily as needed for moderate pain.   Yes Historical Provider, MD  acetaminophen (TYLENOL) 325 MG tablet Take 2 tablets (650 mg total) by mouth every 4 (four) hours as needed for headache or mild pain. Patient not taking: Reported on 04/14/2015 02/07/15   Elmarie Shiley, MD  aspirin 81 MG chewable tablet Chew 1 tablet (81 mg total) by mouth daily. Patient not taking: Reported on 04/14/2015 02/07/15   Belkys A Regalado, MD  darunavir-cobicistat (PREZCOBIX) 800-150 MG per tablet Take 1 tablet by mouth daily. Swallow whole. Do NOT crush, break or chew tablets. Take  with food. Patient not taking: Reported on 04/14/2015 12/09/14   Carlyle Basques, MD  dexamethasone (DECADRON) 4 MG tablet Take 5 tablets weekly with chemotherapy. Patient not taking: Reported on 04/14/2015 12/24/14   Wyatt Portela, MD  ibuprofen (ADVIL,MOTRIN) 400 MG tablet Take 1 tablet (400 mg total) by mouth 3 (three) times daily. Patient not taking: Reported on 04/14/2015 02/07/15   Belkys A Regalado, MD  oxyCODONE-acetaminophen (PERCOCET/ROXICET) 5-325 MG per tablet Take 1 tablet by mouth every 4 (four) hours as needed. Patient not taking: Reported on 04/14/2015 04/08/15   Wyatt Portela, MD  pomalidomide (POMALYST) 2 MG capsule Take 1 capsule by mouth daily with water on days 1-21. Repeat every 28 days. Patient not taking: Reported on 04/14/2015 04/08/15   Wyatt Portela, MD   BP 109/77 mmHg  Pulse 99  Temp(Src) 97.9 F (36.6 C) (Oral)  Resp 18  Ht 5' 9" (1.753 m)  Wt 140 lb (63.504 kg)  BMI 20.67 kg/m2  SpO2 99% Physical Exam  Constitutional: He is oriented to person, place, and time. He appears well-developed and well-nourished. No distress.  HENT:  Head: Normocephalic and atraumatic.  Mouth/Throat: Oropharynx is clear and moist.  Eyes: Pupils are equal, round, and reactive to light.  Neck: Normal range of motion. Neck supple.  Cardiovascular: Normal rate, regular rhythm and normal heart sounds.  Exam reveals no gallop and no friction rub.   No murmur heard. Pulmonary/Chest: Effort normal and breath sounds normal. No respiratory distress.  Abdominal: Soft. Bowel sounds are normal. He exhibits no distension.  Musculoskeletal: He exhibits edema.  Neurological: He is alert and oriented to person, place, and time. He exhibits normal muscle tone. Coordination normal.  Skin: Skin is warm and dry. No rash noted. No erythema.  Psychiatric: He has a normal mood and affect. His behavior is normal.  Nursing note and vitals reviewed.   ED Course  Procedures (including critical care time) Labs  Review Labs Reviewed  TROPONIN I - Abnormal; Notable for the following:    Troponin I 0.47 (*)    All other components within normal limits  CBC WITH DIFFERENTIAL/PLATELET - Abnormal; Notable for the following:    WBC 3.0 (*)    Hemoglobin 12.3 (*)    HCT 36.6 (*)    Neutrophils Relative % 32 (*)    Lymphocytes Relative 55 (*)    Neutro Abs 1.0 (*)    All other components within normal limits  BASIC METABOLIC PANEL - Abnormal; Notable for the following:    Glucose, Bld 103 (*)    All other components within normal limits  URINALYSIS, ROUTINE W REFLEX MICROSCOPIC    Imaging Review Dg Chest 2 View  04/14/2015   CLINICAL DATA:  Chronic chest pain, with worsening dyspnea and pain over the past month.  EXAM: CHEST  2 VIEW  COMPARISON:  02/03/2015  FINDINGS: There is a right jugular Port-A-Cath with tip in the low SVC. There are intact appearances of  the transvenous pacing leads.  There is unchanged moderate right hemidiaphragm elevation. The lungs are clear. The pulmonary vasculature is normal. There are no pleural effusions. Hilar, mediastinal and cardiac contours are unremarkable and unchanged.  IMPRESSION: No active cardiopulmonary disease.   Electronically Signed   By: Andreas Newport M.D.   On: 04/14/2015 19:53     EKG Interpretation   Date/Time:  Monday Apr 14 2015 17:35:40 EDT Ventricular Rate:  102 PR Interval:  163 QRS Duration: 156 QT Interval:  401 QTC Calculation: 522 R Axis:   -93 Text Interpretation:  Sinus tachycardia RBBB and LAFB No significant  change since last tracing Confirmed by [email or web address]  MD, SCOTT [phone]) on  04/14/2015 5:49:15 PM       The patient has chronically elevated troponins and a recent heart catheterization does not show any significant disease.  There is no change in his current presentation from previous.  A is advised follow-up with his cardiologist.  Told to return here as needed  Dalia Heading, PA-C 04/17/15 [phone]

## 2015-04-24 ENCOUNTER — Telehealth: Payer: Self-pay | Admitting: *Deleted

## 2015-04-24 NOTE — Telephone Encounter (Signed)
PRESCRIPTION HAS BEEN DISCONTINUED.

## 2015-05-06 ENCOUNTER — Encounter (HOSPITAL_COMMUNITY): Payer: Self-pay | Admitting: Emergency Medicine

## 2015-05-06 DIAGNOSIS — Z79899 Other long term (current) drug therapy: Secondary | ICD-10-CM

## 2015-05-06 DIAGNOSIS — I440 Atrioventricular block, first degree: Secondary | ICD-10-CM | POA: Diagnosis present

## 2015-05-06 DIAGNOSIS — Z79891 Long term (current) use of opiate analgesic: Secondary | ICD-10-CM

## 2015-05-06 DIAGNOSIS — I5023 Acute on chronic systolic (congestive) heart failure: Secondary | ICD-10-CM | POA: Diagnosis not present

## 2015-05-06 DIAGNOSIS — I9589 Other hypotension: Secondary | ICD-10-CM | POA: Diagnosis present

## 2015-05-06 DIAGNOSIS — Z950 Presence of cardiac pacemaker: Secondary | ICD-10-CM

## 2015-05-06 DIAGNOSIS — I429 Cardiomyopathy, unspecified: Secondary | ICD-10-CM | POA: Diagnosis present

## 2015-05-06 DIAGNOSIS — N179 Acute kidney failure, unspecified: Secondary | ICD-10-CM | POA: Diagnosis not present

## 2015-05-06 DIAGNOSIS — Z681 Body mass index (BMI) 19 or less, adult: Secondary | ICD-10-CM

## 2015-05-06 DIAGNOSIS — B20 Human immunodeficiency virus [HIV] disease: Secondary | ICD-10-CM | POA: Diagnosis present

## 2015-05-06 DIAGNOSIS — Z515 Encounter for palliative care: Secondary | ICD-10-CM

## 2015-05-06 DIAGNOSIS — E43 Unspecified severe protein-calorie malnutrition: Secondary | ICD-10-CM | POA: Diagnosis present

## 2015-05-06 DIAGNOSIS — Z87891 Personal history of nicotine dependence: Secondary | ICD-10-CM

## 2015-05-06 DIAGNOSIS — Z7902 Long term (current) use of antithrombotics/antiplatelets: Secondary | ICD-10-CM

## 2015-05-06 DIAGNOSIS — I071 Rheumatic tricuspid insufficiency: Secondary | ICD-10-CM | POA: Diagnosis present

## 2015-05-06 DIAGNOSIS — I2510 Atherosclerotic heart disease of native coronary artery without angina pectoris: Secondary | ICD-10-CM | POA: Diagnosis present

## 2015-05-06 DIAGNOSIS — C9002 Multiple myeloma in relapse: Secondary | ICD-10-CM | POA: Diagnosis present

## 2015-05-06 DIAGNOSIS — Z833 Family history of diabetes mellitus: Secondary | ICD-10-CM

## 2015-05-06 DIAGNOSIS — E854 Organ-limited amyloidosis: Secondary | ICD-10-CM | POA: Diagnosis present

## 2015-05-06 DIAGNOSIS — I252 Old myocardial infarction: Secondary | ICD-10-CM

## 2015-05-06 DIAGNOSIS — R42 Dizziness and giddiness: Secondary | ICD-10-CM | POA: Diagnosis present

## 2015-05-06 DIAGNOSIS — Z809 Family history of malignant neoplasm, unspecified: Secondary | ICD-10-CM

## 2015-05-06 DIAGNOSIS — Z8673 Personal history of transient ischemic attack (TIA), and cerebral infarction without residual deficits: Secondary | ICD-10-CM

## 2015-05-06 DIAGNOSIS — I248 Other forms of acute ischemic heart disease: Secondary | ICD-10-CM | POA: Diagnosis present

## 2015-05-06 DIAGNOSIS — Z66 Do not resuscitate: Secondary | ICD-10-CM | POA: Diagnosis present

## 2015-05-06 DIAGNOSIS — R0609 Other forms of dyspnea: Secondary | ICD-10-CM | POA: Diagnosis present

## 2015-05-06 DIAGNOSIS — R0602 Shortness of breath: Secondary | ICD-10-CM | POA: Diagnosis not present

## 2015-05-06 NOTE — ED Notes (Signed)
Pt. reports exertional dyspnea onset this week worse this evening with occasional dry cough , and chest tightness , denies nausea or diaphoresis . No fever or chills.

## 2015-05-07 ENCOUNTER — Inpatient Hospital Stay (HOSPITAL_COMMUNITY)
Admission: EM | Admit: 2015-05-07 | Discharge: 2015-05-16 | DRG: 291 | Disposition: A | Discharge status: Hospice | Payer: Medicare Other | Attending: Cardiology | Admitting: Cardiology

## 2015-05-07 ENCOUNTER — Emergency Department (HOSPITAL_COMMUNITY): Payer: Medicare Other

## 2015-05-07 DIAGNOSIS — I959 Hypotension, unspecified: Secondary | ICD-10-CM

## 2015-05-07 DIAGNOSIS — E43 Unspecified severe protein-calorie malnutrition: Secondary | ICD-10-CM | POA: Diagnosis present

## 2015-05-07 DIAGNOSIS — I5021 Acute systolic (congestive) heart failure: Secondary | ICD-10-CM | POA: Insufficient documentation

## 2015-05-07 DIAGNOSIS — C9002 Multiple myeloma in relapse: Secondary | ICD-10-CM | POA: Diagnosis present

## 2015-05-07 DIAGNOSIS — I2510 Atherosclerotic heart disease of native coronary artery without angina pectoris: Secondary | ICD-10-CM | POA: Diagnosis present

## 2015-05-07 DIAGNOSIS — Z87891 Personal history of nicotine dependence: Secondary | ICD-10-CM | POA: Diagnosis not present

## 2015-05-07 DIAGNOSIS — N179 Acute kidney failure, unspecified: Secondary | ICD-10-CM | POA: Diagnosis not present

## 2015-05-07 DIAGNOSIS — R0609 Other forms of dyspnea: Secondary | ICD-10-CM | POA: Diagnosis present

## 2015-05-07 DIAGNOSIS — D61818 Other pancytopenia: Secondary | ICD-10-CM | POA: Diagnosis not present

## 2015-05-07 DIAGNOSIS — R570 Cardiogenic shock: Secondary | ICD-10-CM | POA: Insufficient documentation

## 2015-05-07 DIAGNOSIS — R630 Anorexia: Secondary | ICD-10-CM | POA: Diagnosis not present

## 2015-05-07 DIAGNOSIS — B20 Human immunodeficiency virus [HIV] disease: Secondary | ICD-10-CM | POA: Diagnosis present

## 2015-05-07 DIAGNOSIS — I9589 Other hypotension: Secondary | ICD-10-CM | POA: Insufficient documentation

## 2015-05-07 DIAGNOSIS — I509 Heart failure, unspecified: Secondary | ICD-10-CM

## 2015-05-07 DIAGNOSIS — I5023 Acute on chronic systolic (congestive) heart failure: Secondary | ICD-10-CM | POA: Diagnosis present

## 2015-05-07 DIAGNOSIS — Z950 Presence of cardiac pacemaker: Secondary | ICD-10-CM | POA: Diagnosis not present

## 2015-05-07 DIAGNOSIS — E854 Organ-limited amyloidosis: Secondary | ICD-10-CM | POA: Diagnosis present

## 2015-05-07 DIAGNOSIS — Z7902 Long term (current) use of antithrombotics/antiplatelets: Secondary | ICD-10-CM | POA: Diagnosis not present

## 2015-05-07 DIAGNOSIS — I43 Cardiomyopathy in diseases classified elsewhere: Secondary | ICD-10-CM | POA: Diagnosis not present

## 2015-05-07 DIAGNOSIS — I252 Old myocardial infarction: Secondary | ICD-10-CM | POA: Diagnosis not present

## 2015-05-07 DIAGNOSIS — Z681 Body mass index (BMI) 19 or less, adult: Secondary | ICD-10-CM | POA: Diagnosis not present

## 2015-05-07 DIAGNOSIS — I071 Rheumatic tricuspid insufficiency: Secondary | ICD-10-CM | POA: Diagnosis present

## 2015-05-07 DIAGNOSIS — I248 Other forms of acute ischemic heart disease: Secondary | ICD-10-CM | POA: Diagnosis present

## 2015-05-07 DIAGNOSIS — Z515 Encounter for palliative care: Secondary | ICD-10-CM | POA: Insufficient documentation

## 2015-05-07 DIAGNOSIS — R7989 Other specified abnormal findings of blood chemistry: Secondary | ICD-10-CM | POA: Diagnosis present

## 2015-05-07 DIAGNOSIS — R0602 Shortness of breath: Secondary | ICD-10-CM | POA: Diagnosis present

## 2015-05-07 DIAGNOSIS — R609 Edema, unspecified: Secondary | ICD-10-CM | POA: Diagnosis not present

## 2015-05-07 DIAGNOSIS — R5383 Other fatigue: Secondary | ICD-10-CM

## 2015-05-07 DIAGNOSIS — R42 Dizziness and giddiness: Secondary | ICD-10-CM | POA: Diagnosis present

## 2015-05-07 DIAGNOSIS — Z809 Family history of malignant neoplasm, unspecified: Secondary | ICD-10-CM | POA: Diagnosis not present

## 2015-05-07 DIAGNOSIS — I440 Atrioventricular block, first degree: Secondary | ICD-10-CM | POA: Diagnosis present

## 2015-05-07 DIAGNOSIS — I5031 Acute diastolic (congestive) heart failure: Secondary | ICD-10-CM | POA: Diagnosis not present

## 2015-05-07 DIAGNOSIS — Z833 Family history of diabetes mellitus: Secondary | ICD-10-CM | POA: Diagnosis not present

## 2015-05-07 DIAGNOSIS — R0600 Dyspnea, unspecified: Secondary | ICD-10-CM | POA: Insufficient documentation

## 2015-05-07 DIAGNOSIS — R778 Other specified abnormalities of plasma proteins: Secondary | ICD-10-CM | POA: Diagnosis present

## 2015-05-07 DIAGNOSIS — Z79899 Other long term (current) drug therapy: Secondary | ICD-10-CM | POA: Diagnosis not present

## 2015-05-07 DIAGNOSIS — Z79891 Long term (current) use of opiate analgesic: Secondary | ICD-10-CM | POA: Diagnosis not present

## 2015-05-07 DIAGNOSIS — Z8673 Personal history of transient ischemic attack (TIA), and cerebral infarction without residual deficits: Secondary | ICD-10-CM | POA: Diagnosis not present

## 2015-05-07 DIAGNOSIS — Z66 Do not resuscitate: Secondary | ICD-10-CM | POA: Diagnosis present

## 2015-05-07 DIAGNOSIS — I429 Cardiomyopathy, unspecified: Secondary | ICD-10-CM | POA: Diagnosis present

## 2015-05-07 LAB — URINALYSIS, ROUTINE W REFLEX MICROSCOPIC
Glucose, UA: NEGATIVE mg/dL
KETONES UR: 15 mg/dL — AB
LEUKOCYTES UA: NEGATIVE
NITRITE: NEGATIVE
Protein, ur: 100 mg/dL — AB
Specific Gravity, Urine: 1.026 (ref 1.005–1.030)
Urobilinogen, UA: 1 mg/dL (ref 0.0–1.0)
pH: 5 (ref 5.0–8.0)

## 2015-05-07 LAB — CBC
HEMATOCRIT: 33.4 % — AB (ref 39.0–52.0)
HEMOGLOBIN: 10.9 g/dL — AB (ref 13.0–17.0)
MCH: 28.6 pg (ref 26.0–34.0)
MCHC: 32.6 g/dL (ref 30.0–36.0)
MCV: 87.7 fL (ref 78.0–100.0)
Platelets: 108 10*3/uL — ABNORMAL LOW (ref 150–400)
RBC: 3.81 MIL/uL — ABNORMAL LOW (ref 4.22–5.81)
RDW: 14.2 % (ref 11.5–15.5)
WBC: 3.1 10*3/uL — ABNORMAL LOW (ref 4.0–10.5)

## 2015-05-07 LAB — I-STAT TROPONIN, ED: TROPONIN I, POC: 0.52 ng/mL — AB (ref 0.00–0.08)

## 2015-05-07 LAB — BASIC METABOLIC PANEL
Anion gap: 13 (ref 5–15)
BUN: 12 mg/dL (ref 6–20)
CALCIUM: 9.7 mg/dL (ref 8.9–10.3)
CO2: 23 mmol/L (ref 22–32)
Chloride: 102 mmol/L (ref 101–111)
Creatinine, Ser: 1.06 mg/dL (ref 0.61–1.24)
GFR calc Af Amer: >60 mL/min (ref >60)
GFR calc non Af Amer: >60 mL/min (ref >60)
Glucose, Bld: 119 mg/dL — ABNORMAL HIGH (ref 65–99)
Potassium: 3.9 mmol/L (ref 3.5–5.1)
Sodium: 138 mmol/L (ref 135–145)

## 2015-05-07 LAB — HEPATIC FUNCTION PANEL
ALBUMIN: 3.9 g/dL (ref 3.5–5.0)
ALK PHOS: 76 U/L (ref 38–126)
ALT: 39 U/L (ref 17–63)
AST: 48 U/L — AB (ref 15–41)
BILIRUBIN INDIRECT: 0.8 mg/dL (ref 0.3–0.9)
BILIRUBIN TOTAL: 1.3 mg/dL — AB (ref 0.3–1.2)
Bilirubin, Direct: 0.5 mg/dL (ref 0.1–0.5)
Total Protein: 5.9 g/dL — ABNORMAL LOW (ref 6.5–8.1)

## 2015-05-07 LAB — TROPONIN I
TROPONIN I: 0.41 ng/mL — AB (ref <0.031)
TROPONIN I: 0.39 ng/mL — AB (ref <0.031)
Troponin I: 0.35 ng/mL — ABNORMAL HIGH (ref <0.031)

## 2015-05-07 LAB — URINE MICROSCOPIC-ADD ON

## 2015-05-07 LAB — CORTISOL: CORTISOL PLASMA: 11.6 ug/dL

## 2015-05-07 LAB — BRAIN NATRIURETIC PEPTIDE: B Natriuretic Peptide: 1028.4 pg/mL — ABNORMAL HIGH (ref 0.0–100.0)

## 2015-05-07 LAB — TSH: TSH: 2.601 u[IU]/mL (ref 0.350–4.500)

## 2015-05-07 LAB — PREALBUMIN: PREALBUMIN: 24.1 mg/dL (ref 18–38)

## 2015-05-07 LAB — MRSA PCR SCREENING: MRSA by PCR: NEGATIVE

## 2015-05-07 MED ORDER — ATAZANAVIR-COBICISTAT 300-150 MG PO TABS
1.0000 | ORAL_TABLET | Freq: Every day | ORAL | Status: DC
  Administered 2015-05-07 – 2015-05-08 (×2): 1 via ORAL
  Filled 2015-05-07 (×2): qty 1

## 2015-05-07 MED ORDER — HYDROMORPHONE HCL 1 MG/ML IJ SOLN: 0.5000 mg | Freq: Once | INTRAMUSCULAR | Status: DC

## 2015-05-07 MED ORDER — SODIUM CHLORIDE 0.9 % IV BOLUS (SEPSIS)
500.0000 mL | Freq: Once | INTRAVENOUS | Status: AC
  Administered 2015-05-07: 500 mL via INTRAVENOUS

## 2015-05-07 MED ORDER — SODIUM CHLORIDE 0.9 % IV BOLUS (SEPSIS)
250.0000 mL | Freq: Once | INTRAVENOUS | Status: AC
  Administered 2015-05-07: 250 mL via INTRAVENOUS

## 2015-05-07 MED ORDER — FUROSEMIDE 10 MG/ML IJ SOLN: 40.0000 mg | Freq: Once | INTRAMUSCULAR | Status: DC

## 2015-05-07 MED ORDER — SODIUM CHLORIDE 0.9 % IJ SOLN
3.0000 mL | Freq: Two times a day (BID) | INTRAMUSCULAR | Status: DC
  Administered 2015-05-07 – 2015-05-16 (×14): 3 mL via INTRAVENOUS
  Filled 2015-05-07: qty 3

## 2015-05-07 MED ORDER — FUROSEMIDE 10 MG/ML IJ SOLN
40.0000 mg | Freq: Once | INTRAMUSCULAR | Status: AC
  Administered 2015-05-07: 40 mg via INTRAVENOUS
  Filled 2015-05-07: qty 4

## 2015-05-07 MED ORDER — HEPARIN SODIUM (PORCINE) 5000 UNIT/ML IJ SOLN
5000.0000 [IU] | Freq: Three times a day (TID) | INTRAMUSCULAR | Status: DC
  Administered 2015-05-07 – 2015-05-08 (×4): 5000 [IU] via SUBCUTANEOUS
  Filled 2015-05-07 (×5): qty 1

## 2015-05-07 MED ORDER — EMTRICITABINE-TENOFOVIR DF 200-300 MG PO TABS
1.0000 | ORAL_TABLET | Freq: Every day | ORAL | Status: DC
  Administered 2015-05-07 – 2015-05-14 (×8): 1 via ORAL
  Filled 2015-05-07 (×10): qty 1

## 2015-05-07 MED ORDER — CLOPIDOGREL BISULFATE 75 MG PO TABS
75.0000 mg | ORAL_TABLET | Freq: Every day | ORAL | Status: DC
  Administered 2015-05-07 – 2015-05-16 (×10): 75 mg via ORAL
  Filled 2015-05-07 (×10): qty 1

## 2015-05-07 MED ORDER — HYDROCODONE-ACETAMINOPHEN 10-325 MG PO TABS
1.0000 | ORAL_TABLET | Freq: Two times a day (BID) | ORAL | Status: DC | PRN
  Administered 2015-05-07 – 2015-05-12 (×10): 1 via ORAL
  Filled 2015-05-07 (×10): qty 1

## 2015-05-07 MED ORDER — GI COCKTAIL ~~LOC~~
30.0000 mL | Freq: Once | ORAL | Status: AC
  Administered 2015-05-07: 30 mL via ORAL
  Filled 2015-05-07: qty 30

## 2015-05-07 MED ORDER — FENTANYL CITRATE (PF) 100 MCG/2ML IJ SOLN
50.0000 ug | Freq: Once | INTRAMUSCULAR | Status: AC
  Administered 2015-05-07: 50 ug via INTRAVENOUS
  Filled 2015-05-07: qty 2

## 2015-05-07 NOTE — ED Notes (Signed)
Placed patient on O2 [address] at 2lpm.

## 2015-05-07 NOTE — Care Management Note (Signed)
Case Management Note  Patient Details  Name: Rick Potter MRN: [id number] Date of Birth: 1946/05/25  Subjective/Objective:     Adm w shortness of breath               Action/Plan: lives at home   Expected Discharge Date:                  Expected Discharge Plan:  Home/Self Care  In-House Referral:     Discharge planning Services     Post Acute Care Choice:    Choice offered to:     DME Arranged:    DME Agency:     HH Arranged:    [place] Agency:     Status of Service:     Medicare Important Message Given:    Date Medicare IM Given:    Medicare IM give by:    Date Additional Medicare IM Given:    Additional Medicare Important Message give by:     If discussed at [place] of Stay Meetings, dates discussed:    Additional Comments: ur review done  Lacretia Leigh, RN 05/07/2015, 3:48 PM

## 2015-05-07 NOTE — Progress Notes (Signed)
Pt arrived to stepdown 2H22, NAD VSS. No c/o pain at this time.

## 2015-05-07 NOTE — ED Notes (Signed)
Patient ambulated in hallway. Patient became unsteady outside of room and returned.   HR 110.  O2 maintained at 100% on RA. Patient notes shortness of breath and fatigue.

## 2015-05-07 NOTE — H&P (Signed)
[facility] History and Physical  Rick Potter TSV:[id number] DOB: 30-Oct-1946 DOA: 05/07/2015  Referring physician: EDP PCP: [name], MD   Chief Complaint: DOE   HPI: Rick Potter is a 69 y.o. male with h/o HIV, chronic chest pain, multiple myeloma in relapse.  Patient presents to ED with c/o SOB primarily on any exertion.  It is so severe he is barely able to get up and walk around the room.  It has been ongoing for over a month, but has been especially worse these past 3 days.  His dull central CP is 3/10 in intensity, exacerbated by movement or eating, and lasts for 15-20 mins when it occurs, this has been ongoing for over the past month.  He has had chronically elevated troponin since March, in March he had a heart cath which showed non-occlusive disease.  Echo done at that time showed normal EF, but with "abnormal basal strain".  Diastolic function couldn't be assessed with that study.  Review of Systems: Systems reviewed.  As above, otherwise negative  Past Medical History  Diagnosis Date  . HIV infection dx'd 1990    Viral load undetectable in January with CD4 count of 340  . Hyperlipidemia     Patient denies this hx on 02/04/2015  . Presence of permanent cardiac pacemaker   . TIA (transient ischemic attack) ~ 2005  . Borderline type 2 diabetes mellitus     "Borderline" under surveillance, no medical therapy  . Multiple myeloma dx'd 2011   Past Surgical History  Procedure Laterality Date  . Cervical laminectomy  ~ 2012  . Insert / replace / remove pacemaker  ~ 2003  . Inguinal hernia repair Bilateral ~ 1999  . Hernia repair  ~ 1999    UHR  . Umbilical hernia repair    . Mediport insertion, single Right ~ 2012  . Left heart catheterization with coronary angiogram N/A 02/04/2015    Procedure: LEFT HEART CATHETERIZATION WITH CORONARY ANGIOGRAM;  Surgeon: Peter M Martinique, MD;  Location: [facility] CATH LAB;  Service: Cardiovascular;  Laterality: N/A;  .  Esophagogastroduodenoscopy N/A 02/07/2015    Procedure: ESOPHAGOGASTRODUODENOSCOPY (EGD);  Surgeon: Inda Castle, MD;  Location: [place];  Service: Endoscopy;  Laterality: N/A;   Social History:  reports that he quit smoking about 17 years ago. His smoking use included Cigarettes. He has a 17.5 pack-year smoking history. He has never used smokeless tobacco. He reports that he uses illicit drugs (Marijuana). He reports that he does not drink alcohol.  No Known Allergies  Family History  Problem Relation Age of Onset  . Stroke Mother   . Cancer Sister   . Diabetes Brother   . Alcohol abuse Brother      Prior to Admission medications   Medication Sig Start Date End Date Taking? Authorizing Provider  atazanavir-cobicistat (EVOTAZ) 300-150 MG per tablet Take 1 tablet by mouth daily. Swallow whole. Do NOT crush, cut or chew tablet. Take with food.   Yes Historical Provider, MD  clopidogrel (PLAVIX) 75 MG tablet Take 75 mg by mouth daily.   Yes Historical Provider, MD  emtricitabine-tenofovir (TRUVADA) 200-300 MG per tablet Take 1 tablet by mouth daily. 09/02/14  Yes Carlyle Basques, MD  HYDROcodone-acetaminophen [facility]) 10-325 MG per tablet Take 1 tablet by mouth 2 (two) times daily as needed for moderate pain.   Yes Historical Provider, MD   Physical Exam: Filed Vitals:   05/07/15 0245  BP: 84/62  Pulse: 98  Temp:   Resp:  BP 84/62 mmHg  Pulse 98  Temp(Src) 97.5 F (36.4 C) (Oral)  Resp 23  SpO2 99%  General Appearance:    Alert, oriented, no distress, appears stated age, malnourished  Head:    Normocephalic, atraumatic  Eyes:    PERRL, EOMI, sclera non-icteric        Nose:   Nares without drainage or epistaxis. Mucosa, turbinates normal  Throat:   Moist mucous membranes. Oropharynx without erythema or exudate.  Neck:   Supple. No carotid bruits.  No thyromegaly.  No lymphadenopathy.   Back:     No CVA tenderness, no spinal tenderness  Lungs:     Clear to auscultation  bilaterally, without wheezes, rhonchi or rales  Chest wall:    No tenderness to palpitation  Heart:    Regular rate and rhythm without murmurs, gallops, rubs  Abdomen:     Soft, non-tender, nondistended, normal bowel sounds, no organomegaly  Genitalia:    deferred  Rectal:    deferred  Extremities:   No clubbing, cyanosis, has 2+ pitting edema BLE  Pulses:   2+ and symmetric all extremities  Skin:   Skin color, texture, turgor normal, no rashes or lesions  Lymph nodes:   Cervical, supraclavicular, and axillary nodes normal  Neurologic:   CNII-XII intact. Normal strength, sensation and reflexes      throughout    Labs on Admission:  Basic Metabolic Panel:  Recent Labs Lab 05/07/15 0002  NA 138  K 3.9  CL 102  CO2 23  GLUCOSE 119*  BUN 12  CREATININE 1.06  CALCIUM 9.7   Liver Function Tests: No results for input(s): AST, ALT, ALKPHOS, BILITOT, PROT, ALBUMIN in the last 168 hours. No results for input(s): LIPASE, AMYLASE in the last 168 hours. No results for input(s): AMMONIA in the last 168 hours. CBC:  Recent Labs Lab 05/07/15 0002  WBC 3.1*  HGB 10.9*  HCT 33.4*  MCV 87.7  PLT 108*   Cardiac Enzymes: No results for input(s): CKTOTAL, CKMB, CKMBINDEX, TROPONINI in the last 168 hours.  BNP (last 3 results) No results for input(s): PROBNP in the last 8760 hours. CBG: No results for input(s): GLUCAP in the last 168 hours.  Radiological Exams on Admission: Dg Chest 2 View  05/07/2015   CLINICAL DATA:  Shortness of breath for 3 days.  EXAM: CHEST  2 VIEW  COMPARISON:  04/14/2015.  CT 02/03/2015  FINDINGS: Tip of the right chest port remains in the SVC. Unchanged dual lead left-sided pacemaker. There is unchanged elevation of right hemidiaphragm. Minimal linear atelectasis at the right lung base. Cardiomediastinal contours are unchanged. No confluent consolidation, large pleural effusion or pneumothorax. Postsurgical change in the lower cervical spine palm partially  included. No acute osseous abnormality. The lytic lesions on prior CT are not appreciated radiographically.  IMPRESSION: No acute pulmonary process.   Electronically Signed   By: Jeb Levering M.D.   On: 05/07/2015 01:16    EKG: Independently reviewed.  Assessment/Plan Active Problems:   HIV disease   Multiple myeloma in relapse   Elevated troponin   Protein-calorie malnutrition, severe   SOB (shortness of breath)   DOE (dyspnea on exertion)   1. DOE - unclear cause for patients dyspnea on exertion: 1. Check TSH 2. Recheck 2d echo given the development of peripheral edema and the "abnormal basal strain" pattern noted on previous echo. 3. Check cortisol level given previous steroid usage with chemo 4. Check prealbumin 2. Peripheral edema - has been  ongoing per patient, could be due to heart failure especially given issue #1 above; however, could also be due to  1. 2d echo 2. Checking UA for protein (nephrotic syndrome with his known MM) 3. Checking protein in serum 3. Multiple Myeloma - not on chemotherapy currently 4. HIV - CD4 count is 340, viral load undetectable 1. Continue HAART 5. Elevated troponin - chronically elevated troponin since March 1. 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 Will trend troponin    Code Status: Full Code  Family Communication: No family in room Disposition Plan: Admit to obs   Time spent: 70 min  GARDNER, JARED M. [facility] Pager [phone]  If 7AM-7PM, please contact the day team taking care of the patient [email or web address] Password [email or web address] 05/07/2015, 4:21 AM

## 2015-05-07 NOTE — ED Notes (Signed)
Hospitalist at bedside 

## 2015-05-07 NOTE — Progress Notes (Signed)
PROGRESS NOTE    Rick Potter QIH:[id number] DOB: 06/03/1946 DOA: 05/07/2015 PCP: [name], MD  HPI/Brief narrative 69 year old male patient with history of HIV, multiple myeloma, chronic chest pain, chronically elevated troponin since March, cardiac cath in March showed nonocclusive disease and echo showed normal EF, chronic dyspnea presented to the ED on 05/07/15 with 4-5 days history of worsening dyspnea, especially with exertion.  Assessment/Plan:  Dyspnea on exertion/suspect acute diastolic CHF - Patient has significant bilateral lower extremity edema of at least a month duration. He complains of PND/orthopnea. - Chest x-ray reported as no acute findings. BNP markedly elevated. - Pulmonary consultation appreciated. - IV Lasix as blood pressure tolerates. Supportive treatment with oxygen. Follow repeat 2-D echo. - Recent cardiac cath: Nonocclusive disease. No PE on CT chest in March. - Low index of suspicion for opportunistic lung infections.  Bilateral leg edema - Possibly from CHF. Management as above  Multiple myeloma - Currently not on chemotherapy  HIV - Recent CD4 count 340. Viral load undetectable - Continue antiretroviral treatment - Informed patient's primary infectious disease M.D.  Chronically elevated troponin - Nonocclusive CAD by cath in March  Soft blood pressures - Patient however asymptomatic.  Pancytopenia - ? Secondary to multiple myeloma. Follow CBCs   DVT prophylaxis: Subcutaneous heparin Code Status: Full Family Communication: None at bedside Disposition Plan: DC home when stable   Consultants:  Pulmonology  Procedures:  None  Antibiotics:  None   Subjective: Continued DOE on minimal exertion. No chest pain reported this morning.  Objective: Filed Vitals:   05/07/15 1540 05/07/15 1600 05/07/15 1700 05/07/15 1800  BP:  _0  Pulse:  93 91 98  Temp: 97.6 F (36.4 C) 97.4 F (36.3 C)    TempSrc: Oral  Oral    Resp:  0 18 24  Height: _1  (1.753 m)     Weight: 60.8 kg (134 lb 0.6 oz)     SpO2:  100% 100% 98%    Intake/Output Summary (Last 24 hours) at 05/07/15 1856 Last data filed at 05/07/15 1649  Gross per 24 hour  Intake    623 ml  Output     80 ml  Net    543 ml   Filed Weights   05/07/15 1540  Weight: 60.8 kg (134 lb 0.6 oz)     Exam:  General exam: Moderately built and frail middle-aged male sitting at edge of gurney in the ED without distress Respiratory system: Occasional basal crackles but otherwise clear to auscultation. No increased work of breathing. Cardiovascular system: S1 & S2 heard, RRR. No JVD, murmurs, gallops, clicks. 2+ pitting bilateral leg edema. Gastrointestinal system: Abdomen is nondistended, soft and nontender. Normal bowel sounds heard. Central nervous system: Alert and oriented. No focal neurological deficits. Extremities: Symmetric 5 x 5 power.   Data Reviewed: Basic Metabolic Panel:  Recent Labs Lab 05/07/15 0002  NA 138  K 3.9  CL 102  CO2 23  GLUCOSE 119*  BUN 12  CREATININE 1.06  CALCIUM 9.7   Liver Function Tests:  Recent Labs Lab 05/07/15 0330  AST 48*  ALT 39  ALKPHOS 76  BILITOT 1.3*  PROT 5.9*  ALBUMIN 3.9   No results for input(s): LIPASE, AMYLASE in the last 168 hours. No results for input(s): AMMONIA in the last 168 hours. CBC:  Recent Labs Lab 05/07/15 0002  WBC 3.1*  HGB 10.9*  HCT 33.4*  MCV 87.7  PLT 108*   Cardiac Enzymes:  Recent Labs  Lab 05/07/15 0420 05/07/15 1305 05/07/15 1630  TROPONINI 0.41* 0.39* 0.35*   BNP (last 3 results) No results for input(s): PROBNP in the last 8760 hours. CBG: No results for input(s): GLUCAP in the last 168 hours.  Recent Results (from the past 240 hour(s))  MRSA PCR Screening     Status: None   Collection Time: 05/07/15  3:49 PM  Result Value Ref Range Status   MRSA by PCR NEGATIVE NEGATIVE Final    Comment:        The GeneXpert MRSA Assay  (FDA approved for NASAL specimens only), is one component of a comprehensive MRSA colonization surveillance program. It is not intended to diagnose MRSA infection nor to guide or monitor treatment for MRSA infections.            Studies: Dg Chest 2 View  05/07/2015   CLINICAL DATA:  Shortness of breath for 3 days.  EXAM: CHEST  2 VIEW  COMPARISON:  04/14/2015.  CT 02/03/2015  FINDINGS: Tip of the right chest port remains in the SVC. Unchanged dual lead left-sided pacemaker. There is unchanged elevation of right hemidiaphragm. Minimal linear atelectasis at the right lung base. Cardiomediastinal contours are unchanged. No confluent consolidation, large pleural effusion or pneumothorax. Postsurgical change in the lower cervical spine palm partially included. No acute osseous abnormality. The lytic lesions on prior CT are not appreciated radiographically.  IMPRESSION: No acute pulmonary process.   Electronically Signed   By: Jeb Levering M.D.   On: 05/07/2015 01:16        Scheduled Meds: . atazanavir-cobicistat  1 tablet Oral Daily  . clopidogrel  75 mg Oral Daily  . emtricitabine-tenofovir  1 tablet Oral Daily  . heparin  5,000 Units Subcutaneous 3 times per day  . sodium chloride  3 mL Intravenous Q12H   Continuous Infusions:   Active Problems:   HIV disease   Multiple myeloma in relapse   Elevated troponin   Protein-calorie malnutrition, severe   SOB (shortness of breath)   DOE (dyspnea on exertion)    Time spent: 30 minutes.    Vernell Leep, MD, FACP, FHM. [facility] Pager [phone]  If 7PM-7AM, please contact night-coverage [email or web address] Password [email or web address] 05/07/2015, 6:56 PM    LOS: 0 days

## 2015-05-07 NOTE — ED Notes (Signed)
Pt denying chest pain; report SOB upon any exertion. Reports he has not been told in past BP low. RN to talk with MD regarding POC.

## 2015-05-07 NOTE — ED Notes (Signed)
Pt is sitting on side of bed with this RN, pt states that when his chest starts hurting it helps to sit or stand.

## 2015-05-07 NOTE — ED Provider Notes (Signed)
CSN: [id number]     Arrival date & time 05/06/15  2327 History  This chart was scribed for Pamella Pert, MD by Meriel Pica, ED Scribe. This patient was seen in room A03C/A03C and the patient's care was started 1:04 AM.    Chief Complaint  Patient presents with  . Shortness of Breath   Patient is a 69 y.o. male presenting with shortness of breath. The history is provided by the patient. No language interpreter was used.  Shortness of Breath Severity:  Moderate Onset quality:  Gradual Timing:  Constant Progression:  Worsening Chronicity:  Recurrent Relieved by:  Nothing Worsened by:  Nothing tried Associated symptoms: chest pain   Associated symptoms: no abdominal pain, no cough, no fever, no headaches, no neck pain, no rash, no sore throat and no vomiting     HPI Comments: Rick Potter is a 69 y.o. male, with a PMhx of HIV, who presents to the Emergency Department complaining of gradually worsening SOB that is exacerbated on exertion. Pt notes that the SOB has been ongoing for the past month but that it has worsened over the past 3 days. He reports associated intermittent, dull, 6/10 central CP that is exacerbated when he eats or by movement but only last for 15-20 minutes. He also reports associated bilateral lower extremity swelling onset the past month. Pt notes that this pain is consistent with the symptoms he has been experiencing over the past month. Pt notes he was here in March for an EGD. He was also seen on 5/09 for chronic chest pain and similar complaints. He denies associated cough, fevers, vomiting, diarrhea. He also denies smoking tobacco products.   Past Medical History  Diagnosis Date  . HIV infection dx'd 1990    Viral load undetectable in January with CD4 count of 340  . Hyperlipidemia     Patient denies this hx on 02/04/2015  . Presence of permanent cardiac pacemaker   . TIA (transient ischemic attack) ~ 2005  . Borderline type 2 diabetes mellitus      "Borderline" under surveillance, no medical therapy  . Multiple myeloma dx'd 2011   Past Surgical History  Procedure Laterality Date  . Cervical laminectomy  ~ 2012  . Insert / replace / remove pacemaker  ~ 2003  . Inguinal hernia repair Bilateral ~ 1999  . Hernia repair  ~ 1999    UHR  . Umbilical hernia repair    . Mediport insertion, single Right ~ 2012  . Left heart catheterization with coronary angiogram N/A 02/04/2015    Procedure: LEFT HEART CATHETERIZATION WITH CORONARY ANGIOGRAM;  Surgeon: Peter M Martinique, MD;  Location: [facility] CATH LAB;  Service: Cardiovascular;  Laterality: N/A;  . Esophagogastroduodenoscopy N/A 02/07/2015    Procedure: ESOPHAGOGASTRODUODENOSCOPY (EGD);  Surgeon: Inda Castle, MD;  Location: [place];  Service: Endoscopy;  Laterality: N/A;   Family History  Problem Relation Age of Onset  . Stroke Mother   . Cancer Sister   . Diabetes Brother   . Alcohol abuse Brother    History  Substance Use Topics  . Smoking status: Former Smoker -- 0.50 packs/day for 35 years    Types: Cigarettes    Quit date: 12/06/1997  . Smokeless tobacco: Never Used  . Alcohol Use: No    Review of Systems  Constitutional: Negative for fever.  HENT: Negative for congestion, rhinorrhea and sore throat.   Eyes: Negative for visual disturbance.  Respiratory: Positive for shortness of breath. Negative for cough.  Cardiovascular: Positive for chest pain and leg swelling.  Gastrointestinal: Negative for nausea, vomiting, abdominal pain and diarrhea.  Genitourinary: Negative for dysuria.  Musculoskeletal: Negative for back pain and neck pain.  Skin: Negative for rash.  Neurological: Negative for headaches.  Hematological: Does not bruise/bleed easily.  Psychiatric/Behavioral: Negative for confusion.  All other systems reviewed and are negative.     Allergies  Review of patient's allergies indicates no known allergies.  Home Medications   Prior to Admission medications    Medication Sig Start Date End Date Taking? Authorizing Provider  atazanavir-cobicistat (EVOTAZ) 300-150 MG per tablet Take 1 tablet by mouth daily. Swallow whole. Do NOT crush, cut or chew tablet. Take with food.   Yes Historical Provider, MD  clopidogrel (PLAVIX) 75 MG tablet Take 75 mg by mouth daily.   Yes Historical Provider, MD  emtricitabine-tenofovir (TRUVADA) 200-300 MG per tablet Take 1 tablet by mouth daily. 09/02/14  Yes Carlyle Basques, MD  HYDROcodone-acetaminophen [facility]) 10-325 MG per tablet Take 1 tablet by mouth 2 (two) times daily as needed for moderate pain.   Yes Historical Provider, MD  acetaminophen (TYLENOL) 325 MG tablet Take 2 tablets (650 mg total) by mouth every 4 (four) hours as needed for headache or mild pain. Patient not taking: Reported on 04/14/2015 02/07/15   Elmarie Shiley, MD  aspirin 81 MG chewable tablet Chew 1 tablet (81 mg total) by mouth daily. Patient not taking: Reported on 04/14/2015 02/07/15   Belkys A Regalado, MD  darunavir-cobicistat (PREZCOBIX) 800-150 MG per tablet Take 1 tablet by mouth daily. Swallow whole. Do NOT crush, break or chew tablets. Take with food. Patient not taking: Reported on 04/14/2015 12/09/14   Carlyle Basques, MD  dexamethasone (DECADRON) 4 MG tablet Take 5 tablets weekly with chemotherapy. Patient not taking: Reported on 04/14/2015 12/24/14   Wyatt Portela, MD  ibuprofen (ADVIL,MOTRIN) 400 MG tablet Take 1 tablet (400 mg total) by mouth 3 (three) times daily. Patient not taking: Reported on 04/14/2015 02/07/15   Belkys A Regalado, MD  oxyCODONE-acetaminophen (PERCOCET/ROXICET) 5-325 MG per tablet Take 1 tablet by mouth every 4 (four) hours as needed. Patient not taking: Reported on 04/14/2015 04/08/15   Wyatt Portela, MD  pomalidomide (POMALYST) 2 MG capsule Take 1 capsule by mouth daily with water on days 1-21. Repeat every 28 days. Patient not taking: Reported on 04/14/2015 04/08/15   Wyatt Portela, MD  traMADol-acetaminophen (ULTRACET) 37.5-325  MG per tablet Take 1 tablet by mouth every 6 (six) hours as needed. Patient taking differently: Take 1 tablet by mouth every 6 (six) hours as needed for moderate pain.  04/15/15   Christopher Lawyer, PA-C   BP 88/64 mmHg  Pulse 69  Temp(Src) 97.5 F (36.4 C) (Oral)  Resp 22  SpO2 99%   Physical Exam  Constitutional: He is oriented to person, place, and time. He appears well-developed and well-nourished. No distress.  HENT:  Head: Normocephalic and atraumatic.  Mouth/Throat: Oropharynx is clear and moist.  Eyes: Conjunctivae and EOM are normal. Pupils are equal, round, and reactive to light.  Neck: Normal range of motion. Neck supple. No tracheal deviation present.  Cardiovascular: Normal rate and normal heart sounds.  Exam reveals no gallop and no friction rub.   No murmur heard. Pulmonary/Chest: Breath sounds normal. No respiratory distress.  Abdominal: Soft. He exhibits no distension. There is no tenderness. There is no rebound and no guarding.  Musculoskeletal: Normal range of motion. He exhibits edema. He exhibits no  tenderness.  Mild to moderate pitting edema in bilateral distal lower extremities.   Neurological: He is alert and oriented to person, place, and time.  Skin: Skin is warm and dry.  Psychiatric: He has a normal mood and affect. His behavior is normal.  Nursing note and vitals reviewed.   ED Course  Procedures  DIAGNOSTIC STUDIES: Oxygen Saturation is 99% on RA, normal by my interpretation.    COORDINATION OF CARE: 1:10 AM Discussed treatment plan which includes to give fluids and pain medication and get diagnostic tests  with pt. Pt acknowledges and agrees to plan.   Labs Review Labs Reviewed  BASIC METABOLIC PANEL - Abnormal; Notable for the following:    Glucose, Bld 119 (*)    All other components within normal limits  CBC - Abnormal; Notable for the following:    WBC 3.1 (*)    RBC 3.81 (*)    Hemoglobin 10.9 (*)    HCT 33.4 (*)    Platelets 108 (*)     All other components within normal limits  BRAIN NATRIURETIC PEPTIDE - Abnormal; Notable for the following:    B Natriuretic Peptide 1028.4 (*)    All other components within normal limits  I-STAT TROPOININ, ED - Abnormal; Notable for the following:    Troponin i, poc 0.52 (*)    All other components within normal limits    Imaging Review Dg Chest 2 View  05/07/2015   CLINICAL DATA:  Shortness of breath for 3 days.  EXAM: CHEST  2 VIEW  COMPARISON:  04/14/2015.  CT 02/03/2015  FINDINGS: Tip of the right chest port remains in the SVC. Unchanged dual lead left-sided pacemaker. There is unchanged elevation of right hemidiaphragm. Minimal linear atelectasis at the right lung base. Cardiomediastinal contours are unchanged. No confluent consolidation, large pleural effusion or pneumothorax. Postsurgical change in the lower cervical spine palm partially included. No acute osseous abnormality. The lytic lesions on prior CT are not appreciated radiographically.  IMPRESSION: No acute pulmonary process.   Electronically Signed   By: Jeb Levering M.D.   On: 05/07/2015 01:16     EKG Interpretation   Date/Time:  Tuesday May 06 2015 23:38:38 EDT Ventricular Rate:  103 PR Interval:  174 QRS Duration: 156 QT Interval:  412 QTC Calculation: 539 R Axis:   -79 Text Interpretation:  Sinus tachycardia with frequent Premature  ventricular complexes Left axis deviation Right bundle branch block  Anterior infarct , age undetermined No significant change since last  tracing Confirmed by Lucky Alverson  MD, Gerome Kokesh ([id number]) on 05/07/2015 1:01:51 AM      MDM   Final diagnoses:  SOB (shortness of breath)  Other fatigue  Edema  Elevated brain natriuretic peptide (BNP) level    7:58 AM 69 y.o. male w hx of HIV, TIA, multiple myeloma who presents with worsening fatigue, shortness of breath, and lower extremity swelling. He also notes chest pain which seems to be related to eating and consistent with his chronic  chest pain. He does have history of chronically elevated troponin and his troponin is elevated today. BNP is also elevated and patient became very short of breath and fatigued with minimal ambulation. The hospitalist was consult is for evaluation and admission.    I personally performed the services described in this documentation, which was scribed in my presence. The recorded information has been reviewed and is accurate.       Pamella Pert, MD 05/07/15 [phone]

## 2015-05-07 NOTE — Consult Note (Signed)
Name: Peace Jost MRN: [id number] DOB: 11/07/1946    ADMISSION DATE:  05/07/2015 CONSULTATION DATE:  6/1  REFERRING MD :  Hongalgi  CHIEF COMPLAINT:  SOB  BRIEF PATIENT DESCRIPTION: 69 year old male with hx HIV, multiple myeloma admitted 6/1 complaining of SOB with exertion and intermittent atypical chest pain. He is without hypoxemia on RA, however SOB persists. PCCM to see.   SIGNIFICANT EVENTS    STUDIES:  Admission CXR > no acute cardiopulmonary process. Chronic R hemidiaphragm elevation. Port a cath, Pacemaker in place.    HISTORY OF PRESENT ILLNESS:  69 year old male with PMH as below, which includes HIV on HAART (CD4 count 340, undetectable viral load) , multiple myeloma (not currently on chemo, was previously on Velcade and decadron), TIA, and borderline DM, and pacemaker status. He was admitted in 01/2015 with chest pain and SOB. Pain clinically sounded like GERD, however prior trials of treating this did not help his symptoms. He also had troponin elevation at the time of admission. Had cath which showed mild non-obstructive CAD, no PE found, echo with normal systolic function (diastolic could not be evaluated on study), negative endoscopy, Hidascan negative for chole. He was discharged on low dose lasix and pericarditis treatment.   6/1 he presents again to [facility] ED with complaints of profound SOB and intermittent, atypical chest pain. In ED he did not have hypoxia, but reported that SOB is primarily exertional, but severely limits his activity. Other complaints include orthopnea and PND. These have been going on for greater than one month. Chest pain was described as dull intermittent substernal 3/10 exacerbated by eating and moving. Duration 20 mins. Troponin has been chronically elevated since 02/2015 which is consistent, and somewhat improved today. SOB persists so PCCM has been asked to eval.  PAST MEDICAL HISTORY :   has a past medical history of HIV infection (dx'd 1990);  Hyperlipidemia; Presence of permanent cardiac pacemaker; TIA (transient ischemic attack) (~ 2005); Borderline type 2 diabetes mellitus; and Multiple myeloma (dx'd 2011).  has past surgical history that includes Cervical laminectomy (~ 2012); Insert / replace / remove pacemaker (~ 2003); Inguinal hernia repair (Bilateral, ~ 1999); Hernia repair (~ 1999); Umbilical hernia repair; Mediport insertion, single (Right, ~ 2012); left heart catheterization with coronary angiogram (N/A, 02/04/2015); and Esophagogastroduodenoscopy (N/A, 02/07/2015). Prior to Admission medications   Medication Sig Start Date End Date Taking? Authorizing Provider  atazanavir-cobicistat (EVOTAZ) 300-150 MG per tablet Take 1 tablet by mouth daily. Swallow whole. Do NOT crush, cut or chew tablet. Take with food.   Yes Historical Provider, MD  clopidogrel (PLAVIX) 75 MG tablet Take 75 mg by mouth daily.   Yes Historical Provider, MD  emtricitabine-tenofovir (TRUVADA) 200-300 MG per tablet Take 1 tablet by mouth daily. 09/02/14  Yes Carlyle Basques, MD  HYDROcodone-acetaminophen [facility]) 10-325 MG per tablet Take 1 tablet by mouth 2 (two) times daily as needed for moderate pain.   Yes Historical Provider, MD   No Known Allergies  FAMILY HISTORY:  family history includes Alcohol abuse in his brother; Cancer in his sister; Diabetes in his brother; Stroke in his mother. SOCIAL HISTORY:  reports that he quit smoking about 17 years ago. His smoking use included Cigarettes. He has a 17.5 pack-year smoking history. He has never used smokeless tobacco. He reports that he uses illicit drugs (Marijuana). He reports that he does not drink alcohol.  REVIEW OF SYSTEMS:   Bolds are positive  Constitutional: weight loss, gain, night sweats, Fevers,  chills, fatigue .  HEENT: headaches, Sore throat, sneezing, nasal congestion, post nasal drip, Difficulty swallowing, Tooth/dental problems, visual complaints visual changes, ear ache CV:  chest pain,  radiates: ,Orthopnea, PND, swelling in lower extremities, dizziness, palpitations, syncope.  GI  heartburn, indigestion, abdominal pain, nausea, vomiting, diarrhea, change in bowel habits, loss of appetite, bloody stools.  Resp: cough, productive: , hemoptysis, DOE, chest pain, pleuritic.  Skin: rash or itching or icterus GU: dysuria, change in color of urine, urgency or frequency. flank pain, hematuria  MS: joint pain or swelling. decreased range of motion  Psych: change in mood or affect. depression or anxiety.  Neuro: difficulty with speech, weakness, numbness, ataxia    SUBJECTIVE:   VITAL SIGNS: Temp:  [97.4 F (36.3 C)-98.6 F (37 C)] 97.4 F (36.3 C) (06/01 1123) Pulse Rate:  [69-108] 94 (06/01 1045) Resp:  [10-29] 29 (06/01 1123) BP: (75-98)/(54-72) 98/71 mmHg (06/01 1123) SpO2:  [98 %-100 %] 100 % (06/01 1123)  PHYSICAL EXAMINATION: General:  Male of normal body habitus in NAD at rest Neuro:  Alert, oriented, non-focal HEENT:  [place]/AT, PERRL, no JVD noted Cardiovascular:  RRR, no MRG Lungs:  Bibasilar crackles Abdomen:  Soft, non-tender, non-distended Musculoskeletal:  No acute deformity or ROM limitations Skin:  Impressive 2+ pitting edema BLE   Recent Labs Lab 05/07/15 0002  NA 138  K 3.9  CL 102  CO2 23  BUN 12  CREATININE 1.06  GLUCOSE 119*    Recent Labs Lab 05/07/15 0002  HGB 10.9*  HCT 33.4*  WBC 3.1*  PLT 108*   Dg Chest 2 View  05/07/2015   CLINICAL DATA:  Shortness of breath for 3 days.  EXAM: CHEST  2 VIEW  COMPARISON:  04/14/2015.  CT 02/03/2015  FINDINGS: Tip of the right chest port remains in the SVC. Unchanged dual lead left-sided pacemaker. There is unchanged elevation of right hemidiaphragm. Minimal linear atelectasis at the right lung base. Cardiomediastinal contours are unchanged. No confluent consolidation, large pleural effusion or pneumothorax. Postsurgical change in the lower cervical spine palm partially included. No acute osseous  abnormality. The lytic lesions on prior CT are not appreciated radiographically.  IMPRESSION: No acute pulmonary process.   Electronically Signed   By: Jeb Levering M.D.   On: 05/07/2015 01:16    ASSESSMENT / PLAN:  Dyspnea on exertion - given lower extremity edema and PND/orthopena complaints suspect that this is due to congestive heart failure despite CXR without acute findings. Previous echo showed no systolic failure, but was unable to determine whether or not a degree of diastolic failure was present. Of note, BNP markedly elevated. Doubt COPD with absence of wheeze. No PE on CT scan in March. Chronic troponin elevation with no ischemia or acute change on EKG Suspect acute diastolic CHF R Hemidiaphragm elevation  Recommendations:  - Supplemental O2 as needed to maintain SpO2 greater than 92% - Repeat CXR in AM - Diurese as BP tolerates - F/u repeat Echo - IS - Telemetry  Georgann Housekeeper, AGACNP-BC [place] [facility] Pager [phone] or [phone]  Attending Note:  69 year old male with HIV history, last CD4 count >300 with none detectable viral load who has not been feeling well for the past 2 months.  Has had multiple admissions.  Had an echo done that I reviewed and shows normal EF but there maybe evidence of diastolic dysfunction and a cardiac cath that showed clean coronaries.  The patient reports orthopnea and paroxysmal nocturnal dyspnea and bilateral  pedal edema.  CXR that I reviewed myself however showed very little pulmonary edema.  O2 sat is 100% on RA.  I suspect the dyspnea is subjective in nature and suspect is related to CHF rather than COPD as I hear no wheezing on exam.  DOE: likely cardiac in nature.  PE is not a consideration here as sats are normal on RA.  - Will perform an ambulatory desaturation study to evaluate if patient desats with activity.  - Needs be evaluated from a cardiac standpoint.  - CXR in AM.  - IS per RT  protocol.  Bilateral lower ext edema: cardiac is the most likely cause and I suspect diastolic dysfunction.  - Will repeat echo.  - Lasix as ordered.  Orthopnea: suspect related to pulmonary edema.  - F/U on echo.  - Diureses as ordered.  Discussed with PCCM-NP and Dr. Algis Liming.  Patient seen and examined, agree with above note.  I dictated the care and orders written for this patient under my direction.  Rush Farmer, MD [phone]  05/07/2015 12:39 PM

## 2015-05-07 NOTE — Progress Notes (Signed)
Phoned ED to get report.  No answer.

## 2015-05-08 ENCOUNTER — Inpatient Hospital Stay (HOSPITAL_COMMUNITY): Payer: Medicare Other

## 2015-05-08 DIAGNOSIS — I5031 Acute diastolic (congestive) heart failure: Secondary | ICD-10-CM

## 2015-05-08 DIAGNOSIS — B20 Human immunodeficiency virus [HIV] disease: Secondary | ICD-10-CM

## 2015-05-08 DIAGNOSIS — I5021 Acute systolic (congestive) heart failure: Secondary | ICD-10-CM | POA: Insufficient documentation

## 2015-05-08 DIAGNOSIS — I43 Cardiomyopathy in diseases classified elsewhere: Secondary | ICD-10-CM

## 2015-05-08 DIAGNOSIS — R0600 Dyspnea, unspecified: Secondary | ICD-10-CM

## 2015-05-08 DIAGNOSIS — D61818 Other pancytopenia: Secondary | ICD-10-CM

## 2015-05-08 DIAGNOSIS — I959 Hypotension, unspecified: Secondary | ICD-10-CM

## 2015-05-08 DIAGNOSIS — R0602 Shortness of breath: Secondary | ICD-10-CM

## 2015-05-08 DIAGNOSIS — E854 Organ-limited amyloidosis: Secondary | ICD-10-CM | POA: Insufficient documentation

## 2015-05-08 LAB — BASIC METABOLIC PANEL
Anion gap: 8 (ref 5–15)
BUN: 12 mg/dL (ref 6–20)
CALCIUM: 9 mg/dL (ref 8.9–10.3)
CHLORIDE: 101 mmol/L (ref 101–111)
CO2: 27 mmol/L (ref 22–32)
CREATININE: 0.95 mg/dL (ref 0.61–1.24)
GFR calc Af Amer: >60 mL/min (ref >60)
GFR calc non Af Amer: >60 mL/min (ref >60)
GLUCOSE: 104 mg/dL — AB (ref 65–99)
Potassium: 3.5 mmol/L (ref 3.5–5.1)
Sodium: 136 mmol/L (ref 135–145)

## 2015-05-08 LAB — CBC
HEMATOCRIT: 32.9 % — AB (ref 39.0–52.0)
HEMOGLOBIN: 10.8 g/dL — AB (ref 13.0–17.0)
MCH: 28.2 pg (ref 26.0–34.0)
MCHC: 32.8 g/dL (ref 30.0–36.0)
MCV: 85.9 fL (ref 78.0–100.0)
PLATELETS: 134 10*3/uL — AB (ref 150–400)
RBC: 3.83 MIL/uL — AB (ref 4.22–5.81)
RDW: 14.1 % (ref 11.5–15.5)
WBC: 2.3 10*3/uL — ABNORMAL LOW (ref 4.0–10.5)

## 2015-05-08 MED ORDER — DARUNAVIR-COBICISTAT 800-150 MG PO TABS
1.0000 | ORAL_TABLET | Freq: Every day | ORAL | Status: DC
  Administered 2015-05-09 – 2015-05-15 (×7): 1 via ORAL
  Filled 2015-05-08 (×8): qty 1

## 2015-05-08 MED ORDER — POTASSIUM CHLORIDE CRYS ER 20 MEQ PO TBCR
40.0000 meq | EXTENDED_RELEASE_TABLET | Freq: Two times a day (BID) | ORAL | Status: DC
  Administered 2015-05-08 – 2015-05-13 (×11): 40 meq via ORAL
  Filled 2015-05-08 (×14): qty 2

## 2015-05-08 MED ORDER — HEPARIN SODIUM (PORCINE) 5000 UNIT/ML IJ SOLN
5000.0000 [IU] | Freq: Three times a day (TID) | INTRAMUSCULAR | Status: DC
  Administered 2015-05-08 – 2015-05-11 (×7): 5000 [IU] via SUBCUTANEOUS
  Filled 2015-05-08 (×12): qty 1

## 2015-05-08 MED ORDER — FUROSEMIDE 10 MG/ML IJ SOLN
40.0000 mg | Freq: Three times a day (TID) | INTRAMUSCULAR | Status: DC
Start: 2015-05-08 — End: 2015-05-10
  Administered 2015-05-08 – 2015-05-09 (×3): 40 mg via INTRAVENOUS
  Filled 2015-05-08 (×8): qty 4

## 2015-05-08 MED ORDER — ONDANSETRON HCL 4 MG/2ML IJ SOLN
4.0000 mg | Freq: Four times a day (QID) | INTRAMUSCULAR | Status: DC | PRN
Start: 2015-05-08 — End: 2015-05-16

## 2015-05-08 MED ORDER — BOOST / RESOURCE BREEZE PO LIQD
1.0000 | Freq: Three times a day (TID) | ORAL | Status: DC
Start: 2015-05-08 — End: 2015-05-16
  Administered 2015-05-08 – 2015-05-16 (×18): 1 via ORAL

## 2015-05-08 NOTE — Progress Notes (Signed)
  Echocardiogram 2D Echocardiogram has been performed.  Darlina Sicilian M 05/08/2015, 12:11 PM

## 2015-05-08 NOTE — Progress Notes (Signed)
PROGRESS NOTE    Rick Potter ALP:[id number] DOB: [birth year] DOA: 05/07/2015 PCP: [name], MD  HPI/Brief narrative 69 year old male patient with history of HIV, multiple myeloma, chronic chest pain, chronically elevated troponin since March, cardiac cath in March showed nonocclusive disease and echo showed normal EF, chronic dyspnea, PPM presented to the ED on 05/07/15 with 4-5 days history of worsening dyspnea, especially with exertion.  Assessment/Plan:  Dyspnea on exertion/suspect acute diastolic CHF - Patient has significant bilateral lower extremity edema of at least a month duration. He complains of PND/orthopnea. - Chest x-ray reported as no acute findings. BNP markedly elevated. - Pulmonary consultation appreciated. - IV Lasix as blood pressure tolerates. Supportive treatment with oxygen. Follow repeat 2-D echo. - Recent cardiac cath: Nonocclusive disease. No PE on CT chest in March. - Low index of suspicion for opportunistic lung infections. - No significant diuresis by a dose of Lasix 40 mg IV 1 on 6/1. Patient's chronic low blood pressures (states that for the last few months he's been having low blood pressures in the 80s and informed of same by nurse at his PCPs office) limits aggressive diuresis. - Requested cardiology assistance on 6/1.  Bilateral leg edema - Possibly from CHF. Management as above - Serum albumin normal - Urine microscopy showed 100 mg per DL protein. Check 24-hour urinary protein.  Multiple myeloma - Currently not on chemotherapy - Outpatient follow-up with oncology  HIV - Recent CD4 count 340. Viral load undetectable - Continue antiretroviral treatment - Informed patient's primary infectious disease M.D on 6/1.  Chronically elevated troponin - Nonocclusive CAD by cath in March  Soft blood pressures/chronic hypotension - Patient states that at times he does feel dizzy and lightheaded but no syncopal episodes or  falls.  Pancytopenia - ? Secondary to multiple myeloma. Follow CBCs - Stable.   DVT prophylaxis: Subcutaneous heparin Code Status: Full Family Communication: None at bedside Disposition Plan: Not medically stable for discharge. Continue management in the stepdown unit.   Consultants:  Pulmonology  Cardiology  Procedures:  None  Antibiotics:  None   Subjective: Patient states that he urinated twice after a dose of Lasix last night. DOE unchanged.   Objective: Filed Vitals:   05/08/15 0349 05/08/15 0400 05/08/15 0600 05/08/15 0826  BP:  _0  Pulse: 87 86 90 96  Temp: 97.5 F (36.4 C)   98.3 F (36.8 C)  TempSrc: Oral   Oral  Resp: _1 Height:      Weight:      SpO2: 98% 98% 99% 100%    Intake/Output Summary (Last 24 hours) at 05/08/15 1138 Last data filed at 05/08/15 0700  Gross per 24 hour  Intake    603 ml  Output   1125 ml  Net   -522 ml   Filed Weights   05/07/15 1540  Weight: 60.8 kg (134 lb 0.6 oz)     Exam:  General exam: Moderately built and frail middle-aged male sitting at edge bed without distress Respiratory system: Occasional basal crackles but otherwise clear to auscultation. No increased work of breathing. Cardiovascular system: S1 & S2 heard, RRR. No JVD, murmurs, gallops, clicks. 2+ pitting bilateral leg edema. telemetry: Sinus rhythm in the 90s with bundle branch morphology  Gastrointestinal system: Abdomen is nondistended, soft and nontender. Normal bowel sounds heard. Central nervous system: Alert and oriented. No focal neurological deficits. Extremities: Symmetric 5 x 5 power.   Data Reviewed: Basic Metabolic Panel:  Recent Labs Lab  05/07/15 0002 05/08/15 0500  NA 138 136  K 3.9 3.5  CL 102 101  CO2 23 27  GLUCOSE 119* 104*  BUN 12 12  CREATININE 1.06 0.95  CALCIUM 9.7 9.0   Liver Function Tests:  Recent Labs Lab 05/07/15 0330  AST 48*  ALT 39  ALKPHOS 76  BILITOT 1.3*  PROT 5.9*   ALBUMIN 3.9   No results for input(s): LIPASE, AMYLASE in the last 168 hours. No results for input(s): AMMONIA in the last 168 hours. CBC:  Recent Labs Lab 05/07/15 0002 05/08/15 0500  WBC 3.1* 2.3*  HGB 10.9* 10.8*  HCT 33.4* 32.9*  MCV 87.7 85.9  PLT 108* 134*   Cardiac Enzymes:  Recent Labs Lab 05/07/15 0420 05/07/15 1305 05/07/15 1630  TROPONINI 0.41* 0.39* 0.35*   BNP (last 3 results) No results for input(s): PROBNP in the last 8760 hours. CBG: No results for input(s): GLUCAP in the last 168 hours.  Recent Results (from the past 240 hour(s))  MRSA PCR Screening     Status: None   Collection Time: 05/07/15  3:49 PM  Result Value Ref Range Status   MRSA by PCR NEGATIVE NEGATIVE Final    Comment:        The GeneXpert MRSA Assay (FDA approved for NASAL specimens only), is one component of a comprehensive MRSA colonization surveillance program. It is not intended to diagnose MRSA infection nor to guide or monitor treatment for MRSA infections.            Studies: Dg Chest 2 View  05/07/2015   CLINICAL DATA:  Shortness of breath for 3 days.  EXAM: CHEST  2 VIEW  COMPARISON:  04/14/2015.  CT 02/03/2015  FINDINGS: Tip of the right chest port remains in the SVC. Unchanged dual lead left-sided pacemaker. There is unchanged elevation of right hemidiaphragm. Minimal linear atelectasis at the right lung base. Cardiomediastinal contours are unchanged. No confluent consolidation, large pleural effusion or pneumothorax. Postsurgical change in the lower cervical spine palm partially included. No acute osseous abnormality. The lytic lesions on prior CT are not appreciated radiographically.  IMPRESSION: No acute pulmonary process.   Electronically Signed   By: Jeb Levering M.D.   On: 05/07/2015 01:16        Scheduled Meds: . atazanavir-cobicistat  1 tablet Oral Daily  . clopidogrel  75 mg Oral Daily  . emtricitabine-tenofovir  1 tablet Oral Daily  . feeding  supplement (RESOURCE BREEZE)  1 Container Oral TID BM  . heparin  5,000 Units Subcutaneous 3 times per day  . sodium chloride  3 mL Intravenous Q12H   Continuous Infusions:   Principal Problem:   SOB (shortness of breath) Active Problems:   HIV disease   Multiple myeloma in relapse   Elevated troponin   Protein-calorie malnutrition, severe   DOE (dyspnea on exertion)   Hypotension    Time spent: 20 minutes.    Vernell Leep, MD, FACP, FHM. [facility] Pager [phone]  If 7PM-7AM, please contact night-coverage [email or web address] Password [email or web address] 05/08/2015, 11:38 AM    LOS: 1 day

## 2015-05-08 NOTE — Consult Note (Addendum)
   Name: Rick Potter MRN: [id number] DOB: 06-Aug-1946    ADMISSION DATE:  05/07/2015 CONSULTATION DATE:  6/1  REFERRING MD :  Hongalgi  CHIEF COMPLAINT:  SOB  BRIEF PATIENT DESCRIPTION: 69 year old male with hx HIV, multiple myeloma admitted 6/1 complaining of SOB with exertion and intermittent atypical chest pain. He is without hypoxemia on RA, however SOB persists. PCCM to see.   SIGNIFICANT EVENTS    STUDIES:  Admission CXR > no acute cardiopulmonary process. Chronic R hemidiaphragm elevation. Port a cath, Pacemaker in place.    SUBJECTIVE: SOB only with exertion  VITAL SIGNS: Temp:  [97.4 F (36.3 C)-98.3 F (36.8 C)] 98.3 F (36.8 C) (06/02 0826) Pulse Rate:  [86-102] 96 (06/02 0826) Resp:  [0-30] 17 (06/02 0826) BP: (75-105)/(52-72) 81/61 mmHg (06/02 0826) SpO2:  [98 %-100 %] 100 % (06/02 0826) Weight:  [60.8 kg (134 lb 0.6 oz)] 60.8 kg (134 lb 0.6 oz) (06/01 1540)  PHYSICAL EXAMINATION: General:  Male of normal body habitus in No distress Neuro:  Alert, oriented, non-focal HEENT:  jvd wnl PERRL Cardiovascular:  RRR, no MRG Lungs:  reduced Abdomen:  Soft, non-tender, non-distended Musculoskeletal:  No acute deformity or ROM limitations Skin:  3+ pitting edema BLE   Recent Labs Lab 05/07/15 0002 05/08/15 0500  NA 138 136  K 3.9 3.5  CL 102 101  CO2 23 27  BUN 12 12  CREATININE 1.06 0.95  GLUCOSE 119* 104*    Recent Labs Lab 05/07/15 0002 05/08/15 0500  HGB 10.9* 10.8*  HCT 33.4* 32.9*  WBC 3.1* 2.3*  PLT 108* 134*   Dg Chest 2 View  05/07/2015   CLINICAL DATA:  Shortness of breath for 3 days.  EXAM: CHEST  2 VIEW  COMPARISON:  04/14/2015.  CT 02/03/2015  FINDINGS: Tip of the right chest port remains in the SVC. Unchanged dual lead left-sided pacemaker. There is unchanged elevation of right hemidiaphragm. Minimal linear atelectasis at the right lung base. Cardiomediastinal contours are unchanged. No confluent consolidation, large pleural effusion or  pneumothorax. Postsurgical change in the lower cervical spine palm partially included. No acute osseous abnormality. The lytic lesions on prior CT are not appreciated radiographically.  IMPRESSION: No acute pulmonary process.   Electronically Signed   By: Jeb Levering M.D.   On: 05/07/2015 01:16    ASSESSMENT / PLAN:  Dyspnea on exertion -unclear etiology, unimpressive PCXR CT chest feb no acute H/o myeloma Suspect acute diastolic CHF R Hemidiaphragm elevation HIV, noted cd4 19 on 1/4 - at risk PCP, other immunocompromising organisms  He was neg 300 cc, continue to maximize neg balance Cardiac eval on going, neg thus far considered PFT in house, r/o restriction If remains with SOB on exertion, with h/o myeloma, would repeat CT chest , evaluate lymphadenopathy etc Need repeat cd4 count, if remains low, less 200, would consider Bronch BAL - will send cd4 now Will follow up in am  Ensure tsh wnl i see no BB, would avoid also for now Lasix, but agree get cvp  Lavon Paganini. Effie Mould, MD, [place] Pgr: [facility]

## 2015-05-08 NOTE — Progress Notes (Signed)
Spoke with primary RN regarding PICC order.   Pt has a pacemaker on left and and PAC on the right.   Instructed to have them notify Interventional radiology to have PICC inserted.

## 2015-05-08 NOTE — Progress Notes (Signed)
Initial Nutrition Assessment  DOCUMENTATION CODES:  Severe malnutrition in context of chronic illness  INTERVENTION:  Boost Breeze  NUTRITION DIAGNOSIS:  Malnutrition related to chronic illness as evidenced by meal completion < 25%, severe depletion of body fat, severe depletion of muscle mass.   GOAL:  Patient will meet greater than or equal to 90% of their needs   MONITOR:  PO intake, Supplement acceptance, Labs, Weight trends, Skin, I & O's  REASON FOR ASSESSMENT:  Malnutrition Screening Tool    ASSESSMENT: 69 year old male with hx HIV, multiple myeloma admitted 6/1 complaining of SOB with exertion and intermittent atypical chest pain. He is without hypoxemia on RA, however SOB persists.   Pt admitted with suspected acute diastolic CHF.   He endorses ongoing poor appetite and weight loss. He reveals that he has been losing weight for the past 3 years and estimates he has lost approximately 20# within the past 3 months, however, this is not consistent with weight hx. He reports his UBW is around 150#, which he last weighed several years ago.   He reports that he has been progressively weaker over the past months and has not been able to cook for himself as he should. He eats out a lot because he does not have the energy to prepare food on his own. He reports pain with swallowing initially, however, pain is eliminated after he continues to eat. He consumed 20% of breakfast per doc flowsheets- pt reports he ate all of his eggs, bacon, and orange juice.   He reports that he enjoys the [place] and would like to continue with it. He expressed interest in consuming at home, but has had difficulty finding it in stores- discussed ways that pt could obtain supplement where available. Also discussed importance fo good PO intake to promote healing, importance of consuming protein-rich foods, and consuming supplements.   Nutrition-Focused physical exam completed.  Findings are moderate to severe fat depletion, moderate to severe muscle depletion, and moderate edema.   Height:  Ht Readings from Last 1 Encounters:  05/07/15 5' 9"  (1.753 m)    Weight:  Wt Readings from Last 1 Encounters:  05/07/15 134 lb 0.6 oz (60.8 kg)    Ideal Body Weight:  72.7 kg  Wt Readings from Last 10 Encounters:  05/07/15 134 lb 0.6 oz (60.8 kg)  04/14/15 140 lb (63.504 kg)  03/07/15 133 lb 6.4 oz (60.51 kg)  02/03/15 128 lb (58.06 kg)  01/29/15 129 lb 1.6 oz (58.559 kg)  01/14/15 126 lb (57.153 kg)  12/24/14 132 lb 9.6 oz (60.147 kg)  12/09/14 135 lb (61.236 kg)  11/20/14 134 lb 1.6 oz (60.827 kg)  09/20/14 141 lb 8 oz (64.184 kg)    BMI:  Body mass index is 19.79 kg/(m^2).  Estimated Nutritional Needs:  Kcal:  1900-2100  Protein:  85-95 grams  Fluid:  1.9-2.1 L  Skin:  Reviewed, no issues  Diet Order:  Diet NPO time specified  EDUCATION NEEDS:  Education needs addressed   Intake/Output Summary (Last 24 hours) at 05/08/15 1037 Last data filed at 05/08/15 0700  Gross per 24 hour  Intake    603 ml  Output   1125 ml  Net   -522 ml    Last BM:  05/07/15  Rick Potter A. Jimmye Norman, RD, LDN, CDE Pager: [phone] After hours Pager: [phone]

## 2015-05-08 NOTE — Consult Note (Addendum)
Advanced Heart Failure Team Consult Note  Referring Physician: Algis Liming, A Primary Physician: [name], MD with [facility] Primary Cardiologist:  None  Reason for Consultation: CHF  HPI:    Rick Potter is a 69 y.o. male with h/o HIV, chronic chest pain with Echo 02/2015 with EF 25-85 but poor diastolic visualization and LHC 02/04/2015  with mild non-obstructive disease , multiple myeloma in relapse, and Pacemaker. Patient presented to [facility] with c/o SOB primarily on any exertion. He states it has worsened to the point that he becomes SOB with self care such as eating and bathing.  He says he has noticed a change in his breathing x 1 year, with worsening x 1 month, and further worsening x 3 days. He has central CP is 3/10 in intensity, exacerbated by movement or eating, that lasts for 15-20 mins, over the time period of one month. He is currently CP free after pain meds. He denies use of diuretics at home.  He states he has lost weight over the past year, down to the 120s from the 140s.  He has orthopnea, but only sleeps with one pillow.  He does describe the sensation of smothering with nighttime awakenings consistent with PND.  He has peripheral edema with R>L, which has worsened x 1 month.  He denies any recent illnesses. He has had chronically elevated troponin since 02/2015.   He received one dose of lasix 40 mg in the ER. With net I/Os of -100 so far.  + 1L of urine out. CCM has seen and recommends ambulatory desaturation study, IS per RT protocol, repeat echo, and diuresis.  Pertinent labs since admit include troponin trend of 0.41 > 0.39 > 0.35, BNP of 1000. UA was negative. WBC < 2.3  Review of Systems: [y] = yes, [ ]  = no   General: Weight gain [y]; Weight loss [n]; Anorexia [n]; Fatigue [Y]; Fever [n]; Chills [n]; Weakness [Y]  Cardiac: Chest pain/pressure [y]; Resting SOB [n]; Exertional SOB [Y]; Orthopnea [y]; Pedal Edema [y]; Palpitations [n]; Syncope  [n]; Presyncope [n]; Paroxysmal nocturnal dyspnea[y]  Pulmonary: Cough [n]; Wheezing[n]; Hemoptysis[n]; Sputum [n]; Snoring [n]  GI: Vomiting[n]; Dysphagia[n]; Melena[n]; Hematochezia [n]; Heartburn[n]; Abdominal pain [n]; Constipation [n]; Diarrhea [n]; BRBPR [n]  GU: Hematuria[n]; Dysuria [n]; Nocturia[n]  Vascular: Pain in legs with walking [n]; Pain in feet with lying flat [n]; Non-healing sores [n]; Stroke [n]; TIA [n]; Slurred speech [n];  Neuro: Headaches[n]; Vertigo[n]; Seizures[n]; Paresthesias[n];Blurred vision [n]; Diplopia [n]; Vision changes [n]  Ortho/Skin: Arthritis [n]; Joint pain [n]; Muscle pain [n]; Joint swelling [n]; Back Pain [n]; Rash [n]  Psych: Depression[n]; Anxiety[n]  Heme: Bleeding problems [n]; Clotting disorders [n]; Anemia [n]  Endocrine: Diabetes [n]; Thyroid dysfunction[n]   Home Medications Prior to Admission medications   Medication Sig Start Date End Date Taking? Authorizing Provider  atazanavir-cobicistat (EVOTAZ) 300-150 MG per tablet Take 1 tablet by mouth daily. Swallow whole. Do NOT crush, cut or chew tablet. Take with food.   Yes Historical Provider, MD  clopidogrel (PLAVIX) 75 MG tablet Take 75 mg by mouth daily.   Yes Historical Provider, MD  emtricitabine-tenofovir (TRUVADA) 200-300 MG per tablet Take 1 tablet by mouth daily. 09/02/14  Yes Carlyle Basques, MD  HYDROcodone-acetaminophen [facility]) 10-325 MG per tablet Take 1 tablet by mouth 2 (two) times daily as needed for moderate pain.   Yes Historical Provider, MD    Past Medical History: Past Medical History  Diagnosis Date  . HIV infection dx'd 1990  Viral load undetectable in January with CD4 count of 340  . Hyperlipidemia     Patient denies this hx on 02/04/2015  . Presence of permanent cardiac pacemaker   . TIA (transient ischemic attack) ~ 2005  . Borderline type 2 diabetes mellitus     "Borderline" under surveillance, no medical therapy  . Multiple myeloma dx'd 2011    Past  Surgical History: Past Surgical History  Procedure Laterality Date  . Cervical laminectomy  ~ 2012  . Insert / replace / remove pacemaker  ~ 2003  . Inguinal hernia repair Bilateral ~ 1999  . Hernia repair  ~ 1999    UHR  . Umbilical hernia repair    . Mediport insertion, single Right ~ 2012  . Left heart catheterization with coronary angiogram N/A 02/04/2015    Procedure: LEFT HEART CATHETERIZATION WITH CORONARY ANGIOGRAM;  Surgeon: Peter M Martinique, MD;  Location[facility] C[facility] LAB;  Service: Cardiovascular;  Laterality: N/A;  . Esophagogastroduodenoscopy N/A 02/07/2015    Procedure: ESOPHAGOGASTRODUODENOSCOPY (EGD);  Surgeon: Inda Castle, MD;  Location: [place];  Service: Endoscopy;  Laterality: N/A;    Family History: Family History  Problem Relation Age of Onset  . Stroke Mother   . Cancer Sister   . Diabetes Brother   . Alcohol abuse Brother     Social History: History   Social History  . Marital Status: Single    Spouse Name: N/A  . Number of Children: N/A  . 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 Smoking status: Former Smoker -- 0.50 packs/day for 35 years    Types: Cigarettes    Quit date: 12/06/1997  . Smokeless tobacco: Never Used  . Alcohol Use: No  . Drug Use: Yes    Special: Marijuana     Comment: "stopped smoking recreational marijuana ~ 2005"  . Sexual Activity: No     Comment: declined condoms   Other Topics Concern  . None   Social History Narrative   He is not married.  He does not have any children.  Has been in  for about 10 months.  Relocated from [place].    Allergies:  No Known Allergies  Objective:    Vital Signs:   Temp:  [97.4 F (36.3 C)-98.3 F (36.8 C)] 98.3 F (36.8 C) (06/02 0826) Pulse Rate:  [86-102] 96 (06/02 0826) Resp:  [0-30] 17 (06/02 0826) BP: (75-105)/(52-72) 81/61 mmHg (06/02  0826) SpO2:  [98 %-100 %] 100 % (06/02 0826) Weight:  [134 lb 0.6 oz (60.8 kg)] 134 lb 0.6 oz (60.8 kg) (06/01 1540) Last BM Date: 05/07/15  Weight change: Filed Weights   05/07/15 1540  Weight: 134 lb 0.6 oz (60.8 kg)    Intake/Output:   Intake/Output Summary (Last 24 hours) at 05/08/15 1014 Last data filed at 05/08/15 0700  Gross per 24 hour  Intake    603 ml  Output   1125 ml  Net   -522 ml     Physical Exam: General:  Chronically ill appearing, cachectic. NAD HEENT: normal Neck: supple. Dilated EJ, think JVP 12-14 cm. no bruits. No lymphadenopathy or thryomegaly appreciated. Cor: PMI nondisplaced. Regular rate & rhythm. No rubs, gallops or murmurs. Widely split S2.   Lungs: Crackles left base.  Abdomen: soft, nontender, nondistended. No hepatosplenomegaly. No bruits or  masses. Good bowel sounds. Extremities: no cyanosis, clubbing, rash. 3+ edema bilateral LEs. R>L Cool to the touch. Thready radial pulse.  1+ DP and Popliteal pulse Neuro: alert & orientedx3, cranial nerves grossly intact. moves all 4 extremities w/o difficulty. Affect pleasant  Telemetry:  NSR 80-90s  Labs: Basic Metabolic Panel:  Recent Labs Lab 05/07/15 0002 05/08/15 0500  NA 138 136  K 3.9 3.5  CL 102 101  CO2 23 27  GLUCOSE 119* 104*  BUN 12 12  CREATININE 1.06 0.95  CALCIUM 9.7 9.0    Liver Function Tests:  Recent Labs Lab 05/07/15 0330  AST 48*  ALT 39  ALKPHOS 76  BILITOT 1.3*  PROT 5.9*  ALBUMIN 3.9    CBC:  Recent Labs Lab 05/07/15 0002 05/08/15 0500  WBC 3.1* 2.3*  HGB 10.9* 10.8*  HCT 33.4* 32.9*  MCV 87.7 85.9  PLT 108* 134*    Cardiac Enzymes:  Recent Labs Lab 05/07/15 0420 05/07/15 1305 05/07/15 1630  TROPONINI 0.41* 0.39* 0.35*    BNP: BNP (last 3 results)  Recent Labs  12/19/14 1633 02/03/15 1707 05/07/15 0002  BNP 486.7* 621.5* 1028.4*     Other results:  EKG 05/06/2015 - Sinus tach at 103 with PVCs.  LAD, RBBB, inferior Q waves.  Very similar to EKG 02/04/2015   Left Heart Cath, Selective Coronary Angiography, LV angiography 02/04/2015 Indication: 69 yo BM with history of myeloma. Presents with atypical chest pain and elevated troponin.   Procedural Details: The right wrist was prepped, draped, and anesthetized with 1% lidocaine. Using the modified Seldinger technique, a 6 French slender sheath was introduced into the right radial artery. 3 mg of verapamil was administered through the sheath, weight-based unfractionated heparin was administered intravenously. Standard Judkins catheters were used for selective coronary angiography and left ventricular pressures. Catheter exchanges were performed over an exchange length guidewire. There were no immediate procedural complications. A TR band was used for radial hemostasis at the completion of the procedure. The patient was transferred to the post catheterization recovery area for further monitoring. 40 cc of contrast used.  Procedural Findings: Hemodynamics: AO 96/69 mean 83 mm Hg LV 95/18 mm Hg  Coronary angiography: Coronary dominance: right  Left mainstem: normal.  Left anterior descending (LAD): the LAD has diffuse irregularities less than 20%. The first diagonal has 30-40% stenosis in the mid vessel.   Left circumflex (LCx): Normal  Right coronary artery (RCA): mild irregularities in the mid vessel up to 10-20%.   Left ventriculography: Not done  Final Conclusions:  1. Mild nonobstructive CAD   Recommendations: medical management.   Echo 02/04/2015  Study Conclusions  - Left ventricle: The cavity size was normal. Wall thickness was increased in a pattern of moderate LVH. Systolic function was normal. The estimated ejection fraction was in the range of 55% to 60%. Wall motion was normal; there were no regional wall motion abnormalities. Abnormal global basal strain with preserved, if not hyperdynamic apical  shortening. GLPSS is -14%. The study is not technically sufficient to allow evaluation of LV diastolic function. - Mitral valve: Mildly thickened leaflets . There was mild regurgitation. - Left atrium: The atrium was normal in size. - Right ventricle: The cavity size was normal. Wall thickness was normal. Pacer wire or catheter noted in right ventricle. Systolic function was normal. - Right atrium: The atrium was normal in size. Pacer wire or catheter noted in right atrium. - Atrial septum: No defect or patent foramen ovale was identified. -  Tricuspid valve: There was moderate regurgitation. - Pulmonary arteries: PA peak pressure: 41 mm Hg (S). - Pericardium, extracardiac: A trivial pericardial effusion was identified posterior to the heart.  Impressions:  - LVEF 55-60%, moderate LVH, abnormal basal strain, pacer wires noted, moderate TR, RVSP 41 mmHg, trivial posterior pericardial effusion.  Imaging: Dg Chest 2 View  05/07/2015   CLINICAL DATA:  Shortness of breath for 3 days.  EXAM: CHEST  2 VIEW  COMPARISON:  04/14/2015.  CT 02/03/2015  FINDINGS: Tip of the right chest port remains in the SVC. Unchanged dual lead left-sided pacemaker. There is unchanged elevation of right hemidiaphragm. Minimal linear atelectasis at the right lung base. Cardiomediastinal contours are unchanged. No confluent consolidation, large pleural effusion or pneumothorax. Postsurgical change in the lower cervical spine palm partially included. No acute osseous abnormality. The lytic lesions on prior CT are not appreciated radiographically.  IMPRESSION: No acute pulmonary process.   Electronically Signed   By: Jeb Levering M.D.   On: 05/07/2015 01:16     Medications:     Current Medications: . atazanavir-cobicistat  1 tablet Oral Daily  . clopidogrel  75 mg Oral Daily  . emtricitabine-tenofovir  1 tablet Oral Daily  . heparin  5,000 Units Subcutaneous 3 times per day  . sodium  chloride  3 mL Intravenous Q12H    Infusions:     Assessment/ Plan   1. SOB/DOE  - Consulted ref concerns for diastolic HF.  Echo in 02/2015 showed good EF, but was not able to evaluate diastolic function.  - Echo pending  - CXR negative for acute pulmonary process  - BNP - 1028.4  - ?Acute on Chronic CHF   - 1 dose of Lasix 40 mg  - Care is complicated by HYPOtension  - 2 lumen PICC line for CVP and Co-ox  - TED hose  - Very thin but no JVP.  ?fluid status   2. Elevated troponin   -Chronic since March 2016. LHC on 02/04/2015 by Peter Martinique, MD, showed mild non-obstructive disease as above.  - 0.41 > 0.39 > 0.35 this admission  3. Hypotension  - Will make diuresing difficult  - Will discuss medications that may be contributing to this with MD  4. HIV disease  - Per primary team  5. Multiple myeloma in relapse  - Per Primary Team  6. Protein-calorie malnutrition, severe  - Per primary team    Length of Stay: [place] 05/08/2015, 10:14 AM  Advanced Heart Failure Team Pager [phone] (M-F; 7a - 4p)  Please contact [facility] for night-coverage after hours (4p -7a ) and weekends on [email or web address]  Patient seen with PA, agree with the above note.  Patient has history of HIV and multiple myeloma.  He has a dual chamber pacemaker.  He was admitted in 2/16 with ?myopericarditis with mild troponin rise.  LHC showed no significant coronary disease.  He returns with exertional dyspnea, worse x 1 week to the point where he is short of breath with any activity.  He is not hypoxic.  On exam, he is volume overloaded. I reviewed his echo.  EF 30% with diffuse hypokinesis.  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  IVC is very dilated.  1. Acute systolic CHF: EF about 71% by echo, he is volume overloaded on exam.  Dilated IVC on echo.  He has history of multiple myeloma.  I am concerned that he may have co-existing amyloidosis with  cardiac involvement given the appearance of his myocardium and his clinical presentation with CHF, nonischemic cardiomyopathy.  BP is on the low side but he denies lightheadedness.  - Lasix 40 mg IV every 8 hrs.  - Will need workup for amyloidosis => need to discuss with heme/onc who follow him for multiple myeloma (if amyloidosis found, would it change treatment?).  Could consider fat pad biopsy.  Cannot do MRI with pacemaker.  2. HIV: At higher risk for lung infections.  No acute pulmonary process on CXR.  Think PE is less likely.  3. Multiple myeloma: Has been treated per heme/onc, Dr Alen Blew.  See above discussion.  4. Elevated troponin: Suspect demand ischemia from acute CHF.  LHC in 2/16 without significant coronary disease.   Loralie Champagne 05/08/2015 2:47 PM

## 2015-05-09 ENCOUNTER — Inpatient Hospital Stay (HOSPITAL_COMMUNITY): Payer: Medicare Other

## 2015-05-09 ENCOUNTER — Telehealth: Payer: Self-pay | Admitting: *Deleted

## 2015-05-09 DIAGNOSIS — R7989 Other specified abnormal findings of blood chemistry: Secondary | ICD-10-CM

## 2015-05-09 DIAGNOSIS — I9589 Other hypotension: Secondary | ICD-10-CM | POA: Insufficient documentation

## 2015-05-09 LAB — BASIC METABOLIC PANEL
Anion gap: 9 (ref 5–15)
BUN: 15 mg/dL (ref 6–20)
CALCIUM: 9.2 mg/dL (ref 8.9–10.3)
CHLORIDE: 100 mmol/L — AB (ref 101–111)
CO2: 26 mmol/L (ref 22–32)
CREATININE: 1.25 mg/dL — AB (ref 0.61–1.24)
GFR calc Af Amer: >60 mL/min (ref >60)
GFR calc non Af Amer: 58 mL/min — ABNORMAL LOW (ref >60)
Glucose, Bld: 134 mg/dL — ABNORMAL HIGH (ref 65–99)
Potassium: 4.4 mmol/L (ref 3.5–5.1)
Sodium: 135 mmol/L (ref 135–145)

## 2015-05-09 LAB — PULMONARY FUNCTION TEST
DL/VA % PRED: 89 %
DL/VA: 4.08 ml/min/mmHg/L
DLCO COR % PRED: 46 %
DLCO COR: 14.29 ml/min/mmHg
DLCO UNC % PRED: 40 %
DLCO unc: 12.48 ml/min/mmHg
FEF 25-75 Pre: 1.25 L/sec
FEF2575-%Pred-Pre: 49 %
FEV1-%PRED-PRE: 56 %
FEV1-Pre: 1.62 L
FEV1FVC-%Pred-Pre: 96 %
FEV6-%Pred-Pre: 60 %
FEV6-Pre: 2.18 L
FEV6FVC-%PRED-PRE: 105 %
FVC-%PRED-PRE: 58 %
FVC-Pre: 2.18 L
Pre FEV1/FVC ratio: 74 %
Pre FEV6/FVC Ratio: 100 %
RV % PRED: 75 %
RV: 1.76 L
TLC % pred: 59 %
TLC: 4.08 L

## 2015-05-09 LAB — T-HELPER CELLS (CD4) COUNT (NOT AT ARMC)
CD4 % Helper T Cell: 11 % — ABNORMAL LOW (ref 33–55)
CD4 T CELL ABS: 240 /uL — AB (ref 400–2700)

## 2015-05-09 LAB — PROTEIN, URINE, 24 HOUR
Collection Interval-UPROT: 24 hours
PROTEIN, URINE: 291 mg/dL
Protein, 24H Urine: 1455 mg/d — ABNORMAL HIGH (ref 50–100)
URINE TOTAL VOLUME-UPROT: 500 mL

## 2015-05-09 LAB — CARBOXYHEMOGLOBIN
CARBOXYHEMOGLOBIN: 1.1 % (ref 0.5–1.5)
Carboxyhemoglobin: 1 % (ref 0.5–1.5)
Methemoglobin: 1.2 % (ref 0.0–1.5)
Methemoglobin: 0.8 % (ref 0.0–1.5)
O2 Saturation: 44.1 %
O2 Saturation: 50.7 %
Total hemoglobin: 11.8 g/dL — ABNORMAL LOW (ref 13.5–18.0)
Total hemoglobin: 11.5 g/dL — ABNORMAL LOW (ref 13.5–18.0)

## 2015-05-09 MED ORDER — LIDOCAINE HCL 1 % IJ SOLN
INTRAMUSCULAR | Status: AC
  Filled 2015-05-09: qty 20

## 2015-05-09 MED ORDER — DOBUTAMINE IN D5W 4-5 MG/ML-% IV SOLN
2.5000 ug/kg/min | INTRAVENOUS | Status: DC
  Administered 2015-05-09: 2.5 ug/kg/min via INTRAVENOUS
  Administered 2015-05-12 – 2015-05-13 (×2): 5 ug/kg/min via INTRAVENOUS
  Filled 2015-05-09 (×3): qty 250

## 2015-05-09 MED ORDER — ALBUTEROL SULFATE (2.5 MG/3ML) 0.083% IN NEBU
2.5000 mg | INHALATION_SOLUTION | Freq: Once | RESPIRATORY_TRACT | Status: DC
Start: 2015-05-09 — End: 2015-05-16

## 2015-05-09 NOTE — Procedures (Signed)
RUE PICC SVC RA 41 cm Fat pad Bx, 18 g times three No comp

## 2015-05-09 NOTE — Progress Notes (Addendum)
PROGRESS NOTE    Rick Potter VZC:[id number] DOB: 1946-02-22 DOA: 05/07/2015 PCP: [name], MD  Primary ID: Dr. Carlyle Basques Primary Oncologist: Dr. Zola Button.  HPI/Brief narrative 69 year old male patient with history of HIV, multiple myeloma, chronic chest pain, chronically elevated troponin since March, cardiac cath in March showed nonocclusive disease and echo showed normal EF, chronic dyspnea, PPM presented to the ED on 05/07/15 with 4-5 days history of worsening dyspnea, especially with exertion. He was admitted to stepdown unit. Pulmonology and cardiology were consulted. His dyspnea is felt to be most likely from acute systolic CHF related to new cardiomyopathy-concern for cardiac amyloid given history of multiple myeloma. Diuresis being limited by chronic hypotension. Plans for PICC line to check CVP, co-ox and may need inotropes/dobutamine. Will also get fat pad biopsy.  Assessment/Plan:  Dyspnea on exertion/Acute systolic CHF - Patient has significant bilateral lower extremity edema of at least a month duration. He complains of PND/orthopnea. - Chest x-ray reported as no acute findings. BNP markedly elevated. - Recent cardiac cath: Nonocclusive disease. No PE on CT chest in March. - Low index of suspicion for opportunistic lung infections. - Patient's chronic low blood pressures (states that for the last few months he's been having low blood pressures in the 80s and informed of same by nurse at his PCPs office) limits aggressive diuresis. - Pulm & advanced Heart Failure team follow-up appreciated. - Rpt Echo: EF 30% & concern for Cardiac amyloidosis. Will d/w Onc-patient's primary oncologist not in office today. - IV Lasix 40 mg TID. I/O & weights ? Inadequately charted. - No significant diuresis. Cardiology plans PICC line, inotropes. - Creatinine has bumped up slightly. Monitor BMP closely.  Bilateral leg edema - Most likely from decompensated CHF. Management as  above - Serum albumin normal - Urine microscopy showed 100 mg per DL protein. Check 24-hour urinary protein.  Multiple myeloma - Currently not on chemotherapy: Supposedly on Pomalidomide >patient states that he has not taken this for a few weeks secondary to persistent diarrhea. - Outpatient follow-up with oncology  HIV - Recent CD4 count 340. Viral load undetectable. F/U rpt CD4. - Continue antiretroviral treatment-pharmacy has verified and made changes to his medications on 6/2. - Informed patient's primary infectious disease M.D on 6/1.  Chronically elevated troponin - Nonocclusive CAD by cath in March - May be related to demand ischemia.  Chronic hypotension - Patient states that at times he does feel dizzy and lightheaded but no syncopal episodes or falls. - Cortisol 11  Pancytopenia - ? Secondary to multiple myeloma. Follow CBCs - Stable.   DVT prophylaxis: Subcutaneous heparin Code Status: Full Family Communication: None at bedside Disposition Plan: Not medically stable for discharge. Continue management in the stepdown unit.   Consultants:  Pulmonology  Cardiology  Procedures:  2 D Echo 05/08/2015: Study Conclusions  - Left ventricle: The cavity size was normal. Wall thickness was normal. Systolic function was severely reduced. The estimated ejection fraction was in the range of 25% to 30%. Diffuse hypokinesis. Probable akinesis of the apical inferolateral, inferior, and inferoseptal myocardium (versus RV pacing induced artifact). Due to first degree atrioventricular block, there was fusion of early and atrial contributions to ventricular filling. The study is not technically sufficient to allow evaluation of LV diastolic function. - Ventricular septum: Septal motion showed abnormal function and dyssynergy. These changes are consistent with right ventricular pacing. - Mitral valve: There was mild regurgitation directed centrally. -  Right ventricle: Systolic function was moderately reduced. - Tricuspid  valve: There was mild-moderate regurgitation. - Pulmonary arteries: Systolic pressure was mildly to moderately increased. PA peak pressure: 47 mm Hg (S). - Pericardium, extracardiac: A trivial pericardial effusion was identified.  Impressions:  - Compared to March 2016 there has been a marked reduction in LV systolic function.  Antibiotics:  None   Subjective: No significant increase in urine output after IV Lasix. Unable to complete PFTs this morning secondary to nausea. No significant change in dyspnea.  Objective: Filed Vitals:   05/09/15 0407 05/09/15 0409 05/09/15 0613 05/09/15 0839  BP:  76/60 76/58 81/65  Pulse:  89 91   Temp: 97.3 F (36.3 C)   97.3 F (36.3 C)  TempSrc: Oral   Oral  Resp:  _0 Height:      Weight:      SpO2:  100% 100%     Intake/Output Summary (Last 24 hours) at 05/09/15 1048 Last data filed at 05/09/15 0000  Gross per 24 hour  Intake    620 ml  Output    475 ml  Net    145 ml   Filed Weights   05/07/15 1540  Weight: 60.8 kg (134 lb 0.6 oz)     Exam:  General exam: Moderately built and frail middle-aged male lying comfortably supine in bed. Respiratory system: Occasional basal crackles but otherwise clear to auscultation. No increased work of breathing. Cardiovascular system: S1 & S2 heard, RRR. No JVD, murmurs, gallops, clicks. 2+ pitting bilateral leg edema. telemetry: Sinus rhythm  with bundle branch morphology-mild sinus tachycardia in the 100s.  Gastrointestinal system: Abdomen is nondistended, soft and nontender. Normal bowel sounds heard. Central nervous system: Alert and oriented. No focal neurological deficits. Extremities: Symmetric 5 x 5 power. Bilateral lower extremity TED hose   Data Reviewed: Basic Metabolic Panel:  Recent Labs Lab 05/07/15 0002 05/08/15 0500 05/09/15 0445  NA 138 136 135  K 3.9 3.5 4.4  CL 102 101 100*  CO2 _1 GLUCOSE 119* 104* 134*  BUN _2 CREATININE 1.06 0.95 1.25*  CALCIUM 9.7 9.0 9.2   Liver Function Tests:  Recent Labs Lab 05/07/15 0330  AST 48*  ALT 39  ALKPHOS 76  BILITOT 1.3*  PROT 5.9*  ALBUMIN 3.9   No results for input(s): LIPASE, AMYLASE in the last 168 hours. No results for input(s): AMMONIA in the last 168 hours. CBC:  Recent Labs Lab 05/07/15 0002 05/08/15 0500  WBC 3.1* 2.3*  HGB 10.9* 10.8*  HCT 33.4* 32.9*  MCV 87.7 85.9  PLT 108* 134*   Cardiac Enzymes:  Recent Labs Lab 05/07/15 0420 05/07/15 1305 05/07/15 1630  TROPONINI 0.41* 0.39* 0.35*   BNP (last 3 results) No results for input(s): PROBNP in the last 8760 hours. CBG: No results for input(s): GLUCAP in the last 168 hours.  Recent Results (from the past 240 hour(s))  MRSA PCR Screening     Status: None   Collection Time: 05/07/15  3:49 PM  Result Value Ref Range Status   MRSA by PCR NEGATIVE NEGATIVE Final    Comment:        The GeneXpert MRSA Assay (FDA approved for NASAL specimens only), is one component of a comprehensive MRSA colonization surveillance program. It is not intended to diagnose MRSA infection nor to guide or monitor treatment for MRSA infections.            Studies: No results found.      Scheduled Meds: .  albuterol  2.5 mg Nebulization Once  . clopidogrel  75 mg Oral Daily  . darunavir-cobicistat  1 tablet Oral Q breakfast  . emtricitabine-tenofovir  1 tablet Oral Daily  . feeding supplement (RESOURCE BREEZE)  1 Container Oral TID BM  . furosemide  40 mg Intravenous 3 times per day  . heparin  5,000 Units Subcutaneous 3 times per day  . potassium chloride  40 mEq Oral BID  . sodium chloride  3 mL Intravenous Q12H   Continuous Infusions:   Principal Problem:   SOB (shortness of breath) Active Problems:   HIV disease   Multiple myeloma in relapse   Elevated troponin   Protein-calorie malnutrition, severe   DOE (dyspnea on  exertion)   Hypotension   Acute systolic CHF (congestive heart failure)   Cardiac amyloidosis   Dyspnea    Time spent: 20 minutes.    Vernell Leep, MD, FACP, FHM. [facility] Pager [phone]  If 7PM-7AM, please contact night-coverage [email or web address] Password [email or web address] 05/09/2015, 10:48 AM    LOS: 2 days

## 2015-05-09 NOTE — Progress Notes (Signed)
Advanced Heart Failure Rounding Note  PCP: [name], MD Primary Cardiologist: None  Subjective:    Feeling about the same, says he is a little nauseated this morning. Explained to him association of multiple myeloma and our concern for cardiac amyloid.  Very little fluid off overnight, creatinine to 1.25. BP remains very soft.  Co-ox this morning is not useful (from peripheral IV).     Objective:   Weight Range: 134 lb 0.6 oz (60.8 kg)  Vital Signs:   Temp:  [97.3 F (36.3 C)-98.3 F (36.8 C)] 97.3 F (36.3 C) (06/03 0407) Pulse Rate:  [89-106] 91 (06/03 0613) Resp:  [12-25] 19 (06/03 0613) BP: (76-88)/(57-70) 76/58 mmHg (06/03 0613) SpO2:  [100 %] 100 % (06/03 0613) Last BM Date: 05/08/15  Weight change: Filed Weights   05/07/15 1540  Weight: 134 lb 0.6 oz (60.8 kg)    Intake/Output:   Intake/Output Summary (Last 24 hours) at 05/09/15 0705 Last data filed at 05/09/15 0000  Gross per 24 hour  Intake    620 ml  Output    475 ml  Net    145 ml     Physical Exam: General: Chronically ill appearing, cachectic. NAD HEENT: normal Neck: supple. Dilated EJ, think JVP 12-14 cm. no bruits. No lymphadenopathy or thryomegaly appreciated. Cor: PMI nondisplaced. Regular rate & rhythm. No rubs, gallops or murmurs. Widely split S2.  Lungs: Crackles left base.  Abdomen: soft, nontender, nondistended. No hepatosplenomegaly. No bruits or masses. Good bowel sounds. Extremities: no cyanosis, clubbing, rash. 3+ edema bilateral LEs, no improvement from yesterday. R>L Cool to the touch. Thready radial pulse. 1+ DP and Popliteal pulse Neuro: alert & orientedx3, cranial nerves grossly intact. moves all 4 extremities w/o difficulty. Affect pleasant   Telemetry: NSR  Labs: CBC  Recent Labs  05/07/15 0002 05/08/15 0500  WBC 3.1* 2.3*  HGB 10.9* 10.8*  HCT 33.4* 32.9*  MCV 87.7 85.9  PLT 108* 829*   Basic Metabolic Panel  Recent Labs  05/08/15 0500  05/09/15 0445  NA 136 135  K 3.5 4.4  CL 101 100*  CO2 27 26  GLUCOSE 104* 134*  BUN 12 15  CALCIUM 9.0 9.2   Liver Function Tests  Recent Labs  05/07/15 0330  AST 48*  ALT 39  ALKPHOS 76  BILITOT 1.3*  PROT 5.9*  ALBUMIN 3.9   No results for input(s): LIPASE, AMYLASE in the last 72 hours. Cardiac Enzymes  Recent Labs  05/07/15 0420 05/07/15 1305 05/07/15 1630  TROPONINI 0.41* 0.39* 0.35*    BNP: BNP (last 3 results)  Recent Labs  12/19/14 1633 02/03/15 1707 05/07/15 0002  BNP 486.7* 621.5* 1028.4*    ProBNP (last 3 results) No results for input(s): PROBNP in the last 8760 hours.   D-Dimer No results for input(s): DDIMER in the last 72 hours. Hemoglobin A1C No results for input(s): HGBA1C in the last 72 hours. Fasting Lipid Panel No results for input(s): CHOL, HDL, LDLCALC, TRIG, CHOLHDL, LDLDIRECT in the last 72 hours. Thyroid Function Tests  Recent Labs  05/07/15 0420  TSH 2.601    Other results:     Imaging/Studies:   No results found.  Latest Echo  05/08/2015 LV EF: 25% -  30%  ------------------------------------------------------------------- Indications:   Dyspnea 786.09.  ------------------------------------------------------------------- History:  PMH:  Dyspnea and hypotension. Congestive heart failure. Transient ischemic attack.  ------------------------------------------------------------------- Study Conclusions  - Left ventricle: The cavity size was normal. Wall thickness was normal. Systolic function was severely  reduced. The estimated ejection fraction was in the range of 25% to 30%. Diffuse hypokinesis. Probable akinesis of the apical inferolateral, inferior, and inferoseptal myocardium (versus RV pacing induced artifact). Due to first degree atrioventricular block, there was fusion of early and atrial contributions to ventricular filling. The study is not technically sufficient to allow  evaluation of LV diastolic function. - Ventricular septum: Septal motion showed abnormal function and dyssynergy. These changes are consistent with right ventricular pacing. - Mitral valve: There was mild regurgitation directed centrally. - Right ventricle: Systolic function was moderately reduced. - Tricuspid valve: There was mild-moderate regurgitation. - Pulmonary arteries: Systolic pressure was mildly to moderately increased. PA peak pressure: 47 mm Hg (S). - Pericardium, extracardiac: A trivial pericardial effusion was identified.  Impressions:  - Compared to March 2016 there has been a marked reduction in LV systolic function.  Latest Cath  Left Heart Cath, Selective Coronary Angiography, LV angiography 02/04/2015 Indication: 69 yo BM with history of myeloma. Presents with atypical chest pain and elevated troponin.   Procedural Details: The right wrist was prepped, draped, and anesthetized with 1% lidocaine. Using the modified Seldinger technique, a 6 French slender sheath was introduced into the right radial artery. 3 mg of verapamil was administered through the sheath, weight-based unfractionated heparin was administered intravenously. Standard Judkins catheters were used for selective coronary angiography and left ventricular pressures. Catheter exchanges were performed over an exchange length guidewire. There were no immediate procedural complications. A TR band was used for radial hemostasis at the completion of the procedure. The patient was transferred to the post catheterization recovery area for further monitoring. 40 cc of contrast used.  Procedural Findings: Hemodynamics: AO 96/69 mean 83 mm Hg LV 95/18 mm Hg  Coronary angiography: Coronary dominance: right  Left mainstem: normal.  Left anterior descending (LAD): the LAD has diffuse irregularities less than 20%. The first diagonal has 30-40% stenosis in the mid vessel.    Left circumflex (LCx): Normal  Right coronary artery (RCA): mild irregularities in the mid vessel up to 10-20%.   Left ventriculography: Not done  Final Conclusions:  1. Mild nonobstructive CAD   Recommendations: medical management.   Medications:     Scheduled Medications: . clopidogrel  75 mg Oral Daily  . darunavir-cobicistat  1 tablet Oral Q breakfast  . emtricitabine-tenofovir  1 tablet Oral Daily  . feeding supplement (RESOURCE BREEZE)  1 Container Oral TID BM  . furosemide  40 mg Intravenous 3 times per day  . heparin  5,000 Units Subcutaneous 3 times per day  . potassium chloride  40 mEq Oral BID  . sodium chloride  3 mL Intravenous Q12H     Infusions:     PRN Medications:  HYDROcodone-acetaminophen, ondansetron (ZOFRAN) IV   Assessment:   Principal Problem:   SOB (shortness of breath) Active Problems:   DOE (dyspnea on exertion)   Hypotension   HIV disease   Multiple myeloma in relapse   Elevated troponin   Protein-calorie malnutrition, severe   Acute systolic CHF (congestive heart failure)   Dyspnea   Plan/Discussion:    1. SOB/DOE - Echo shows 25-30%.  Down from 68-60 in March. - CXR negative for acute pulmonary process - BNP - 1028.4 - Lasix 40 mg IV q 8 hrs  - Very little fluid off overnight, will need to adjust diuresis. - Care is complicated by HYPOtension - Going down now for 2 lumen PICC line for CVP and Co-ox - TED hose  - [place] Fat  pad biopsy pending for ?cardiac amyloid  2. Elevated troponin  -Chronic since March 2016. LHC on 02/04/2015 by Peter Martinique, MD, showed mild non-obstructive disease as above. - 0.41 > 0.39 > 0.35 this admission  3. Hypotension - Will make diuresing difficult - Will discuss medications that may be contributing to this with MD  4. HIV disease -  Per primary team  5. Multiple myeloma in relapse - Per Primary Team  6. Protein-calorie malnutrition, severe - Per primary team  7. Pacemaker - Interrogated, has used very rarely.  Dual chamber, placed in 2014 because of syncope.   Length of Stay: 2   Shirley Friar PA-C 05/09/2015, 7:05 AM  Advanced Heart Failure Team Pager [phone] (M-F; 7a - 4p)  Please contact [facility] for night-coverage after hours (4p -7a ) and weekends on [email or web address]  Patient seen with PA, agree with the above note.  Cool extremities with low BP.  Echo with EF 30%, diffuse hypokinesis.  Given diagnosis of multiple myeloma, I am concerned about co-existing cardiac amyloidosis.  Cath earlier this year without significant coronary disease.   He is going for PICC and for abdominal fat pad biopsy this morning.  Will check CVP and co-ox upon return, suspect he will need an inotrope.  With low BP, would choose dobutamine.   Loralie Champagne 05/09/2015 7:36 AM

## 2015-05-09 NOTE — Consult Note (Signed)
Chief Complaint: Chief Complaint  Patient presents with  . Shortness of Breath  Left ventricular fxn reduction CHF  Referring Physician(s): Dr Loralie Champagne  History of Present Illness: Rick Potter is a 69 y.o. male   Hx HIV Multiple Myeloma--relapse CHF; pacemaker Admitted with increasing shortness of breath over 1-2 months Wt loss New echo does reveal worsening LV fxn Concern for cardiac amyloid Pt also need for PICC for heart pressure measures Now scheduled for fat pad bx and PICC in Radiology   Past Medical History  Diagnosis Date  . HIV infection dx'd 1990    Viral load undetectable in January with CD4 count of 340  . Hyperlipidemia     Patient denies this hx on 02/04/2015  . Presence of permanent cardiac pacemaker   . TIA (transient ischemic attack) ~ 2005  . Borderline type 2 diabetes mellitus     "Borderline" under surveillance, no medical therapy  . Multiple myeloma dx'd 2011    Past Surgical History  Procedure Laterality Date  . Cervical laminectomy  ~ 2012  . Insert / replace / remove pacemaker  ~ 2003  . Inguinal hernia repair Bilateral ~ 1999  . Hernia repair  ~ 1999    UHR  . Umbilical hernia repair    . Mediport insertion, single Right ~ 2012  . Left heart catheterization with coronary angiogram N/A 02/04/2015    Procedure: LEFT HEART CATHETERIZATION WITH CORONARY ANGIOGRAM;  Surgeon: Peter M Martinique, MD;  Location: [facility] CATH LAB;  Service: Cardiovascular;  Laterality: N/A;  . Esophagogastroduodenoscopy N/A 02/07/2015    Procedure: ESOPHAGOGASTRODUODENOSCOPY (EGD);  Surgeon: Inda Castle, MD;  Location: [facility];  Service: Endoscopy;  Laterality: N/A;    Allergies: Review of patient's allergies indicates no known allergies.  Medications: Prior to Admission medications   Medication Sig Start Date End Date Taking? Authorizing Provider  clopidogrel (PLAVIX) 75 MG tablet Take 75 mg by mouth daily.   Yes Historical Provider, MD    darunavir-cobicistat (PREZCOBIX) 800-150 MG per tablet Take 1 tablet by mouth daily. Swallow whole. Do NOT crush, break or chew tablets. Take with food.   Yes Historical Provider, MD  emtricitabine-tenofovir (TRUVADA) 200-300 MG per tablet Take 1 tablet by mouth daily. 09/02/14  Yes Carlyle Basques, MD  HYDROcodone-acetaminophen [facility]) 10-325 MG per tablet Take 1 tablet by mouth 2 (two) times daily as needed for moderate pain.   Yes Historical Provider, MD     Family History  Problem Relation Age of Onset  . Stroke Mother   . Cancer Sister   . Diabetes Brother   . Alcohol abuse Brother     History   Social History  . Marital Status: Single    Spouse Name: N/A  . Number of Children: N/A  . Years of Education: N/A   Occupational History  . 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 Smoking status: Former Smoker -- 0.50 packs/day for 35 years    Types: Cigarettes    Quit date: 12/06/1997  . Smokeless tobacco: Never Used  . Alcohol Use: No  . Drug Use: Yes    Special: Marijuana     Comment: "stopped smoking recreational marijuana ~ 2005"  . Sexual Activity: No     Comment: declined condoms   Other Topics Concern  . None   Social History Narrative   He is not  married.  He does not have any children.  Has been in [place] for about 10 months.  Relocated from [place].     Review of Systems: A 12 point ROS discussed and pertinent positives are indicated in the HPI above.  All other systems are negative.  Review of Systems  Constitutional: Positive for activity change, appetite change, fatigue and unexpected weight change. Negative for fever.  Respiratory: Positive for cough, shortness of breath and wheezing.   Cardiovascular: Negative for chest pain.  Gastrointestinal: Negative for abdominal pain.  Neurological: Positive for weakness. Negative for dizziness.  Psychiatric/Behavioral: Negative for  behavioral problems and confusion.    Vital Signs: BP 76/58 mmHg  Pulse 91  Temp(Src) 97.3 F (36.3 C) (Oral)  Resp 19  Ht 5' 9"  (1.753 m)  Wt 60.8 kg (134 lb 0.6 oz)  BMI 19.79 kg/m2  SpO2 100%  Physical Exam  Constitutional: He is oriented to person, place, and time. He appears well-developed.  Cardiovascular: Normal rate and regular rhythm.   No murmur heard. Pulmonary/Chest: Effort normal. He has wheezes.  Abdominal: Soft. Bowel sounds are normal. There is no tenderness.  Musculoskeletal: Normal range of motion.  Neurological: He is alert and oriented to person, place, and time.  Skin: Skin is warm and dry.  Psychiatric: He has a normal mood and affect. His behavior is normal. Judgment and thought content normal.  Nursing note and vitals reviewed.   Mallampati Score:  MD Evaluation Airway: WNL Heart: WNL Abdomen: WNL Chest/ Lungs: WNL ASA  Classification: 3 Mallampati/Airway Score: One  Imaging: Dg Chest 2 View  05/07/2015   CLINICAL DATA:  Shortness of breath for 3 days.  EXAM: CHEST  2 VIEW  COMPARISON:  04/14/2015.  CT 02/03/2015  FINDINGS: Tip of the right chest port remains in the SVC. Unchanged dual lead left-sided pacemaker. There is unchanged elevation of right hemidiaphragm. Minimal linear atelectasis at the right lung base. Cardiomediastinal contours are unchanged. No confluent consolidation, large pleural effusion or pneumothorax. Postsurgical change in the lower cervical spine palm partially included. No acute osseous abnormality. The lytic lesions on prior CT are not appreciated radiographically.  IMPRESSION: No acute pulmonary process.   Electronically Signed   By: Jeb Levering M.D.   On: 05/07/2015 01:16   Dg Chest 2 View  04/14/2015   CLINICAL DATA:  Chronic chest pain, with worsening dyspnea and pain over the past month.  EXAM: CHEST  2 VIEW  COMPARISON:  02/03/2015  FINDINGS: There is a right jugular Port-A-Cath with tip in the low SVC. There are  intact appearances of the transvenous pacing leads.  There is unchanged moderate right hemidiaphragm elevation. The lungs are clear. The pulmonary vasculature is normal. There are no pleural effusions. Hilar, mediastinal and cardiac contours are unremarkable and unchanged.  IMPRESSION: No active cardiopulmonary disease.   Electronically Signed   By: Andreas Newport M.D.   On: 04/14/2015 19:53    Labs:  CBC:  Recent Labs  02/09/15 0427 04/14/15 1858 05/07/15 0002 05/08/15 0500  WBC 3.0* 3.0* 3.1* 2.3*  HGB 9.8* 12.3* 10.9* 10.8*  HCT 28.9* 36.6* 33.4* 32.9*  PLT 145* 165 108* 134*    COAGS:  Recent Labs  02/03/15 2020 02/04/15 1335  INR 1.28 1.29    BMP:  Recent Labs  04/14/15 1858 05/07/15 0002 05/08/15 0500 05/09/15 0445  NA 141 138 136 135  K 3.8 3.9 3.5 4.4  CL 105 102 101 100*  CO2 25 23 27  26  GLUCOSE 103* 119* 104* 134*  BUN 13 12 12 15   CALCIUM 10.2 9.7 9.0 9.2  CREATININE 1.08 1.06 0.95 1.25*  GFRNONAA >60 >60 >60 58*  GFRAA >60 >60 >60 >60    LIVER FUNCTION TESTS:  Recent Labs  01/21/15 1124 01/29/15 1401 02/03/15 1707 05/07/15 0330  BILITOT 0.81 0.75 0.8 1.3*  AST 32 22 62* 48*  ALT 41 33 54* 39  ALKPHOS 57 55 67 76  PROT 5.8* 5.4* 5.4* 5.9*  ALBUMIN 3.9 3.5 3.5 3.9    TUMOR MARKERS: No results for input(s): AFPTM, CEA, CA199, CHROMGRNA in the last 8760 hours.  Assessment and Plan:  CHF; SOB Worsening LV fxn Concern for cardiac amyloid Scheduled for fat pad bx Risks and Benefits discussed with the patient including, but not limited to bleeding, infection, damage to adjacent structures or low yield requiring additional tests. All of the patient's questions were answered, patient is agreeable to proceed. Consent signed and in chart.  Also scheduled for PICC line placement Aware of procedure benefits and risks including but not limited to: infection , bleeding, vessel damage Agreeable to proceed Consent signed andin  chart  Thank you for this interesting consult.  I greatly enjoyed meeting Lemar Bakos and look forward to participating in their care.  Signed: Genia Perin A 05/09/2015, 8:05 AM   I spent a total of 40 Minutes    in face to face in clinical consultation, greater than 50% of which was counseling/coordinating care for PICC; and fat pad bx

## 2015-05-09 NOTE — Telephone Encounter (Signed)
[facility] called about  Pomalyst refill request.  Patient currently admitted.  Will notify Dr. Alen Blew of this request for review.

## 2015-05-10 DIAGNOSIS — I9589 Other hypotension: Secondary | ICD-10-CM

## 2015-05-10 LAB — BASIC METABOLIC PANEL
Anion gap: 10 (ref 5–15)
BUN: 19 mg/dL (ref 6–20)
CHLORIDE: 101 mmol/L (ref 101–111)
CO2: 25 mmol/L (ref 22–32)
CREATININE: 1.3 mg/dL — AB (ref 0.61–1.24)
Calcium: 9.5 mg/dL (ref 8.9–10.3)
GFR calc non Af Amer: 55 mL/min — ABNORMAL LOW (ref >60)
Glucose, Bld: 100 mg/dL — ABNORMAL HIGH (ref 65–99)
Potassium: 4.2 mmol/L (ref 3.5–5.1)
Sodium: 136 mmol/L (ref 135–145)

## 2015-05-10 LAB — CARBOXYHEMOGLOBIN
CARBOXYHEMOGLOBIN: 0.9 % (ref 0.5–1.5)
Carboxyhemoglobin: 1.2 % (ref 0.5–1.5)
Methemoglobin: 1.2 % (ref 0.0–1.5)
Methemoglobin: 0.9 % (ref 0.0–1.5)
O2 Saturation: 50.3 %
O2 Saturation: 62.4 %
Total hemoglobin: 11.2 g/dL — ABNORMAL LOW (ref 13.5–18.0)
Total hemoglobin: 11 g/dL — ABNORMAL LOW (ref 13.5–18.0)

## 2015-05-10 LAB — CBC
HEMATOCRIT: 32.9 % — AB (ref 39.0–52.0)
HEMOGLOBIN: 10.9 g/dL — AB (ref 13.0–17.0)
MCH: 28.3 pg (ref 26.0–34.0)
MCHC: 33.1 g/dL (ref 30.0–36.0)
MCV: 85.5 fL (ref 78.0–100.0)
PLATELETS: 133 10*3/uL — AB (ref 150–400)
RBC: 3.85 MIL/uL — ABNORMAL LOW (ref 4.22–5.81)
RDW: 14.4 % (ref 11.5–15.5)
WBC: 3.3 10*3/uL — AB (ref 4.0–10.5)

## 2015-05-10 MED ORDER — FUROSEMIDE 10 MG/ML IJ SOLN
80.0000 mg | Freq: Two times a day (BID) | INTRAMUSCULAR | Status: DC
  Administered 2015-05-10 – 2015-05-12 (×4): 80 mg via INTRAVENOUS
  Filled 2015-05-10 (×4): qty 8

## 2015-05-10 MED ORDER — TRAMADOL HCL 50 MG PO TABS
50.0000 mg | ORAL_TABLET | Freq: Four times a day (QID) | ORAL | Status: DC | PRN
  Administered 2015-05-10: 50 mg via ORAL
  Filled 2015-05-10: qty 1

## 2015-05-10 MED ORDER — FUROSEMIDE 10 MG/ML IJ SOLN
INTRAMUSCULAR | Status: AC
  Administered 2015-05-10: 80 mg via INTRAVENOUS
  Filled 2015-05-10: qty 8

## 2015-05-10 NOTE — Progress Notes (Signed)
Patient ID: Rick Potter, male   DOB: 06/29/1946, 69 y.o.   MRN: [id number]   SUBJECTIVE: Patient still short of breath with any movement.  He has on and off mild chest pain.    6/3 had abdominal fat pad biopsy and PICC placement.    Co-ox 44% => 50% with dobutamine gtt 2.5 mcg/kg/min.  BP more stable, SBP now in 80s.  No lightheadedness.  CVP 16 this morning.   UOP not vigorous but has not had all Lasix doses.   Scheduled Meds: . albuterol  2.5 mg Nebulization Once  . clopidogrel  75 mg Oral Daily  . darunavir-cobicistat  1 tablet Oral Q breakfast  . emtricitabine-tenofovir  1 tablet Oral Daily  . feeding supplement (RESOURCE BREEZE)  1 Container Oral TID BM  . furosemide  80 mg Intravenous BID  . heparin  5,000 Units Subcutaneous 3 times per day  . potassium chloride  40 mEq Oral BID  . sodium chloride  3 mL Intravenous Q12H   Continuous Infusions: . DOBUTamine 2.5 mcg/kg/min (05/09/15 1741)   PRN Meds:.HYDROcodone-acetaminophen, ondansetron (ZOFRAN) IV, traMADol    Filed Vitals:   05/10/15 0500 05/10/15 0600 05/10/15 0700 05/10/15 0744  BP:  81/55  80/60  Pulse:    98  Temp:    97.5 F (36.4 C)  TempSrc:    Oral  Resp: 15 18 13 15  Height:      Weight:      SpO2:    98%    Intake/Output Summary (Last 24 hours) at 05/10/15 0812 Last data filed at 05/10/15 0700  Gross per 24 hour  Intake 228.33 ml  Output    800 ml  Net -571.67 ml    LABS: Basic Metabolic Panel:  Recent Labs  05/09/15 0445 05/10/15 0415  NA 135 136  K 4.4 4.2  CL 100* 101  CO2 26 25  GLUCOSE 134* 100*  BUN 15 19  CREATININE 1.25* 1.30*  CALCIUM 9.2 9.5   Liver Function Tests: No results for input(s): AST, ALT, ALKPHOS, BILITOT, PROT, ALBUMIN in the last 72 hours. No results for input(s): LIPASE, AMYLASE in the last 72 hours. CBC:  Recent Labs  05/08/15 0500 05/10/15 0415  WBC 2.3* 3.3*  HGB 10.8* 10.9*  HCT 32.9* 32.9*  MCV 85.9 85.5  PLT 134* 133*   Cardiac  Enzymes:  Recent Labs  05/07/15 1305 05/07/15 1630  TROPONINI 0.39* 0.35*   BNP: Invalid input(s): POCBNP D-Dimer: No results for input(s): DDIMER in the last 72 hours. Hemoglobin A1C: No results for input(s): HGBA1C in the last 72 hours. Fasting Lipid Panel: No results for input(s): CHOL, HDL, LDLCALC, TRIG, CHOLHDL, LDLDIRECT in the last 72 hours. Thyroid Function Tests: No results for input(s): TSH, T4TOTAL, T3FREE, THYROIDAB in the last 72 hours.  Invalid input(s): FREET3 Anemia Panel: No results for input(s): VITAMINB12, FOLATE, FERRITIN, TIBC, IRON, RETICCTPCT in the last 72 hours.  RADIOLOGY: Dg Chest 2 View  05/07/2015   CLINICAL DATA:  Shortness of breath for 3 days.  EXAM: CHEST  2 VIEW  COMPARISON:  04/14/2015.  CT 02/03/2015  FINDINGS: Tip of the right chest port remains in the SVC. Unchanged dual lead left-sided pacemaker. There is unchanged elevation of right hemidiaphragm. Minimal linear atelectasis at the right lung base. Cardiomediastinal contours are unchanged. No confluent consolidation, large pleural effusion or pneumothorax. Postsurgical change in the lower cervical spine palm partially included. No acute osseous abnormality. The lytic lesions on prior CT are not appreciated   radiographically.  IMPRESSION: No acute pulmonary process.   Electronically Signed   By: Melanie  Ehinger M.D.   On: 05/07/2015 01:16   Dg Chest 2 View  04/14/2015   CLINICAL DATA:  Chronic chest pain, with worsening dyspnea and pain over the past month.  EXAM: CHEST  2 VIEW  COMPARISON:  02/03/2015  FINDINGS: There is a right jugular Port-A-Cath with tip in the low SVC. There are intact appearances of the transvenous pacing leads.  There is unchanged moderate right hemidiaphragm elevation. The lungs are clear. The pulmonary vasculature is normal. There are no pleural effusions. Hilar, mediastinal and cardiac contours are unremarkable and unchanged.  IMPRESSION: No active cardiopulmonary disease.    Electronically Signed   By: Daniel R Mitchell M.D.   On: 04/14/2015 19:53    PHYSICAL EXAM General: NAD Neck: JVP 12 cm, no thyromegaly or thyroid nodule.  Lungs: Clear to auscultation bilaterally with normal respiratory effort. CV: Nondisplaced PMI.  Heart regular S1/S2, no S3/S4, no murmur.  1+ edema to knees.  No carotid bruit.   Abdomen: Soft, nontender, no hepatosplenomegaly, no distention.  Neurologic: Alert and oriented x 3.  Psych: Normal affect. Extremities: No clubbing or cyanosis.   TELEMETRY: Reviewed telemetry pt in NSR 90s  ASSESSMENT AND PLAN: 69 yo with history of HIV and multiple myeloma. He has a dual chamber pacemaker ([employer]) for remote syncope. He was admitted in 2/16 with chest pain, ?myopericarditis with mild troponin rise. LHC showed no significant coronary disease. He returns with exertional dyspnea + ongoing chest pain (never resolved), dyspnea worse x 1 week to the point where he is short of breath with any activity. He is not hypoxic.Echo this admission showed EF 30% with diffuse hypokinesis (worse than prior). 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 IVC was very dilated. He has been persistently hypotensive.  1. Acute systolic CHF: EF about 30% by echo, he is volume overloaded on exam. Dilated IVC on echo. He has history of multiple myeloma. I am concerned that he may have co-existing amyloidosis with cardiac involvement given the appearance of his myocardium and his clinical presentation with CHF, nonischemic cardiomyopathy. BP has been low, no lightheadedness and creatinine normal at admission. Co-ox yesterday 44%, increased to 50% with dobutamine 2.5.  CVP 16 this morning.  - Increase dobutamine gtt to 5 mcg/kg/min.  If co-ox/BP remain low, will need to add low dose norepinephrine.  - Lasix 80 mg IV bid today.  - Amyloid workup: Abdominal fat pad biopsy done yesterday. Cannot do MRI with pacemaker.  2.  HIV: CD4 count > 200.  Seen by pulmonary, think no active infection.  On meds.  3. Multiple myeloma: Has been treated per heme/onc, Dr Shadad. He has been offVelcade due to side effects.  He stopped pomalidomide a few weeks ago.   4. Elevated troponin: Suspect demand ischemia from acute CHF. LHC in 2/16 without significant coronary disease. He has had on and off chest discomfort since 2/16.   Dalton McLean 05/10/2015 8:20 AM   

## 2015-05-10 NOTE — Progress Notes (Signed)
PROGRESS NOTE    Rick Potter QXI:[id number] DOB: 04/11/1946 DOA: 05/07/2015 PCP: [name], MD  Primary ID: Dr. Carlyle Basques Primary Oncologist: Dr. Zola Button.  HPI/Brief narrative 69 year old male patient with history of HIV, multiple myeloma, chronic chest pain, chronically elevated troponin since March, cardiac cath in March showed nonocclusive disease and echo showed normal EF, chronic dyspnea, PPM presented to the ED on 05/07/15 with 4-5 days history of worsening dyspnea, especially with exertion. He was admitted to stepdown unit. Pulmonology and cardiology were consulted. His dyspnea is felt to be most likely from acute systolic CHF related to new cardiomyopathy-concern for cardiac amyloid given history of multiple myeloma. Diuresis being limited by chronic hypotension >now PICC line placed, started IV dobutamine and is status post a fat pad biopsy. Discussed with Dr. Aundra Dubin and transferred care over to cardiology service on 05/10/15. TRH will sign off at this time & please call further assistance.  Assessment/Plan:  Dyspnea on exertion/Acute systolic CHF - Patient has significant bilateral lower extremity edema of at least a month duration. He complains of PND/orthopnea. - Chest x-ray reported as no acute findings. BNP markedly elevated. - Recent cardiac cath: Nonocclusive disease. No PE on CT chest in March. - Low index of suspicion for opportunistic lung infections. - Patient's chronic low blood pressures (states that for the last few months he's been having low blood pressures in the 80s and informed of same by nurse at his PCPs office) limits aggressive diuresis. - Pulm & advanced Heart Failure team follow-up appreciated. - Rpt Echo: EF 30% & concern for Cardiac amyloidosis. Will d/w Onc-patient's primary oncologist not in office 6/3. - Patient's diuresis limited by hypotension. PICC line placed 6/3 and started on IV dobutamine drip which was increased from 2.5 to 5 g  per KG per minute today. Lasix dose increased  Bilateral leg edema - Most likely from decompensated CHF. Management as above - Serum albumin normal - Urine microscopy showed 100 mg per DL protein. Check 24-hour urinary protein: 1455 mg per DL (not nephrotic range).  Multiple myeloma - Currently not on chemotherapy: Supposedly on Pomalidomide >patient states that he has not taken this for a few weeks secondary to persistent diarrhea. - Outpatient follow-up with oncology - May consider discussing with his oncologist on 05/12/15  HIV - Recent CD4 count 340. Viral load undetectable. F/U rpt CD4: 240. - Continue antiretroviral treatment-pharmacy has verified and made changes to his medications on 6/2. - Informed patient's primary infectious disease M.D on 6/1.  Chronically elevated troponin - Nonocclusive CAD by cath in March - May be related to demand ischemia.  Chronic hypotension - Patient states that at times he does feel dizzy and lightheaded but no syncopal episodes or falls. - Cortisol 11 - IV dobutamine  Pancytopenia - ? Secondary to multiple myeloma.  - Stable.  Acute kidney injury - Likely secondary to poor perfusion from chronic hypotension and current diuresis - Follow BMP closely   DVT prophylaxis: Subcutaneous heparin Code Status: Full Family Communication: None at bedside. Patient declined MDs offer to discuss his care with family. Disposition Plan: Not medically stable for discharge. Continue management in the stepdown unit. Disposition per cardiology.   Consultants:  Pulmonology  Cardiology  Procedures:  2 D Echo 05/08/2015: Study Conclusions  - Left ventricle: The cavity size was normal. Wall thickness was normal. Systolic function was severely reduced. The estimated ejection fraction was in the range of 25% to 30%. Diffuse hypokinesis. Probable akinesis of the apical inferolateral, inferior,  and inferoseptal myocardium (versus RV pacing  induced artifact). Due to first degree atrioventricular block, there was fusion of early and atrial contributions to ventricular filling. The study is not technically sufficient to allow evaluation of LV diastolic function. - Ventricular septum: Septal motion showed abnormal function and dyssynergy. These changes are consistent with right ventricular pacing. - Mitral valve: There was mild regurgitation directed centrally. - Right ventricle: Systolic function was moderately reduced. - Tricuspid valve: There was mild-moderate regurgitation. - Pulmonary arteries: Systolic pressure was mildly to moderately increased. PA peak pressure: 47 mm Hg (S). - Pericardium, extracardiac: A trivial pericardial effusion was identified.  Impressions:  - Compared to March 2016 there has been a marked reduction in LV systolic function.  Antibiotics:  None   Subjective: DOE without much change.  Objective: Filed Vitals:   05/10/15 1000 05/10/15 1100 05/10/15 1200 05/10/15 1201  BP: 97/76  94/75 94/75  Pulse:    102  Temp:    97.5 F (36.4 C)  TempSrc:    Oral  Resp: _0 Height:      Weight:      SpO2:    97%    Intake/Output Summary (Last 24 hours) at 05/10/15 1437 Last data filed at 05/10/15 1300  Gross per 24 hour  Intake 453.63 ml  Output   2000 ml  Net -1546.37 ml   Filed Weights   05/07/15 1540 05/10/15 0400  Weight: 60.8 kg (134 lb 0.6 oz) 60.464 kg (133 lb 4.8 oz)     Exam:  General exam: Moderately built and frail middle-aged male lying comfortably supine in bed. Respiratory system: Occasional basal crackles but otherwise clear to auscultation. No increased work of breathing. Cardiovascular system: S1 & S2 heard, RRR. No JVD, murmurs, gallops, clicks. 2+ pitting bilateral leg edema. telemetry: Sinus rhythm  with bundle branch morphology.  Gastrointestinal system: Abdomen is nondistended, soft and nontender. Normal bowel sounds heard. Central  nervous system: Alert and oriented. No focal neurological deficits. Extremities: Symmetric 5 x 5 power. Bilateral lower extremity TED hose   Data Reviewed: Basic Metabolic Panel:  Recent Labs Lab 05/07/15 0002 05/08/15 0500 05/09/15 0445 05/10/15 0415  NA 138 136 135 136  K 3.9 3.5 4.4 4.2  CL 102 101 100* 101  CO2 _1 GLUCOSE 119* 104* 134* 100*  BUN _2 CREATININE 1.06 0.95 1.25* 1.30*  CALCIUM 9.7 9.0 9.2 9.5   Liver Function Tests:  Recent Labs Lab 05/07/15 0330  AST 48*  ALT 39  ALKPHOS 76  BILITOT 1.3*  PROT 5.9*  ALBUMIN 3.9   No results for input(s): LIPASE, AMYLASE in the last 168 hours. No results for input(s): AMMONIA in the last 168 hours. CBC:  Recent Labs Lab 05/07/15 0002 05/08/15 0500 05/10/15 0415  WBC 3.1* 2.3* 3.3*  HGB 10.9* 10.8* 10.9*  HCT 33.4* 32.9* 32.9*  MCV 87.7 85.9 85.5  PLT 108* 134* 133*   Cardiac Enzymes:  Recent Labs Lab 05/07/15 0420 05/07/15 1305 05/07/15 1630  TROPONINI 0.41* 0.39* 0.35*   BNP (last 3 results) No results for input(s): PROBNP in the last 8760 hours. CBG: No results for input(s): GLUCAP in the last 168 hours.  Recent Results (from the past 240 hour(s))  MRSA PCR Screening     Status: None   Collection Time: 05/07/15  3:49 PM  Result Value Ref Range Status   MRSA by PCR NEGATIVE NEGATIVE Final    Comment:  The GeneXpert MRSA Assay (FDA approved for NASAL specimens only), is one component of a comprehensive MRSA colonization surveillance program. It is not intended to diagnose MRSA infection nor to guide or monitor treatment for MRSA infections.            Studies: No results found.      Scheduled Meds: . albuterol  2.5 mg Nebulization Once  . clopidogrel  75 mg Oral Daily  . darunavir-cobicistat  1 tablet Oral Q breakfast  . emtricitabine-tenofovir  1 tablet Oral Daily  . feeding supplement (RESOURCE BREEZE)  1 Container Oral TID BM  .  furosemide  80 mg Intravenous BID  . heparin  5,000 Units Subcutaneous 3 times per day  . potassium chloride  40 mEq Oral BID  . sodium chloride  3 mL Intravenous Q12H   Continuous Infusions: . DOBUTamine 5 mcg/kg/min (05/10/15 0800)    Principal Problem:   SOB (shortness of breath) Active Problems:   HIV disease   Multiple myeloma in relapse   Elevated troponin   Protein-calorie malnutrition, severe   DOE (dyspnea on exertion)   Hypotension   Acute systolic CHF (congestive heart failure)   Cardiac amyloidosis   Dyspnea   Chronic hypotension    Time spent: 20 minutes.    Vernell Leep, MD, FACP, FHM. [facility] Pager [phone]  If 7PM-7AM, please contact night-coverage [email or web address] Password [email or web address] 05/10/2015, 2:37 PM    LOS: 3 days

## 2015-05-11 DIAGNOSIS — R570 Cardiogenic shock: Secondary | ICD-10-CM

## 2015-05-11 DIAGNOSIS — C9002 Multiple myeloma in relapse: Secondary | ICD-10-CM

## 2015-05-11 LAB — BASIC METABOLIC PANEL
Anion gap: 10 (ref 5–15)
BUN: 16 mg/dL (ref 6–20)
CALCIUM: 9.5 mg/dL (ref 8.9–10.3)
CO2: 29 mmol/L (ref 22–32)
CREATININE: 1.25 mg/dL — AB (ref 0.61–1.24)
Chloride: 96 mmol/L — ABNORMAL LOW (ref 101–111)
GFR calc Af Amer: >60 mL/min (ref >60)
GFR calc non Af Amer: 58 mL/min — ABNORMAL LOW (ref >60)
Glucose, Bld: 111 mg/dL — ABNORMAL HIGH (ref 65–99)
Potassium: 3.9 mmol/L (ref 3.5–5.1)
Sodium: 135 mmol/L (ref 135–145)

## 2015-05-11 LAB — CBC
HCT: 31.6 % — ABNORMAL LOW (ref 39.0–52.0)
HEMOGLOBIN: 10.6 g/dL — AB (ref 13.0–17.0)
MCH: 28.7 pg (ref 26.0–34.0)
MCHC: 33.5 g/dL (ref 30.0–36.0)
MCV: 85.6 fL (ref 78.0–100.0)
Platelets: 143 10*3/uL — ABNORMAL LOW (ref 150–400)
RBC: 3.69 MIL/uL — ABNORMAL LOW (ref 4.22–5.81)
RDW: 14.4 % (ref 11.5–15.5)
WBC: 2.9 10*3/uL — ABNORMAL LOW (ref 4.0–10.5)

## 2015-05-11 LAB — CARBOXYHEMOGLOBIN
CARBOXYHEMOGLOBIN: 0.7 % (ref 0.5–1.5)
Carboxyhemoglobin: 0.7 % (ref 0.5–1.5)
Methemoglobin: 0.8 % (ref 0.0–1.5)
Methemoglobin: 0.9 % (ref 0.0–1.5)
O2 Saturation: 55.1 %
O2 Saturation: 56.6 %
TOTAL HEMOGLOBIN: 11.3 g/dL — AB (ref 13.5–18.0)
Total hemoglobin: 11 g/dL — ABNORMAL LOW (ref 13.5–18.0)

## 2015-05-11 MED ORDER — SODIUM CHLORIDE 0.9 % IJ SOLN
10.0000 mL | Freq: Two times a day (BID) | INTRAMUSCULAR | Status: DC
  Administered 2015-05-11 – 2015-05-16 (×10): 10 mL

## 2015-05-11 MED ORDER — SODIUM CHLORIDE 0.9 % IJ SOLN
10.0000 mL | INTRAMUSCULAR | Status: DC | PRN
  Administered 2015-05-11: 10 mL
  Filled 2015-05-11: qty 40

## 2015-05-11 MED ORDER — HEPARIN SOD (PORK) LOCK FLUSH 100 UNIT/ML IV SOLN
500.0000 [IU] | INTRAVENOUS | Status: DC
  Administered 2015-05-11: 500 [IU]
  Filled 2015-05-11: qty 5

## 2015-05-11 MED ORDER — NOREPINEPHRINE BITARTRATE 1 MG/ML IV SOLN
2.0000 ug/min | INTRAVENOUS | Status: DC
  Filled 2015-05-11: qty 16

## 2015-05-11 MED ORDER — DEXTROSE 5 % IV SOLN
2.0000 ug/min | INTRAVENOUS | Status: DC
  Filled 2015-05-11: qty 4

## 2015-05-11 MED ORDER — HEPARIN SOD (PORK) LOCK FLUSH 100 UNIT/ML IV SOLN
500.0000 [IU] | INTRAVENOUS | Status: DC | PRN
  Filled 2015-05-11: qty 5

## 2015-05-11 NOTE — Progress Notes (Signed)
Patient ID: Rick Potter, male   DOB: [birth year], 69 y.o.   MRN: [id number]   SUBJECTIVE: Patient still short of breath with any movement.  He has on and off mild chest pain.    6/3 had abdominal fat pad biopsy and PICC placement.    Co-ox 44% => 50%>>55%  with dobutamine gtt 5 mcg/kg/min.  Dobutamine disconnected this am accidentally and SBP 60-70s. Now back up to 90s.    CVP 8-9 this morning.   Feels weak. No energy. Poor appetite. CP improved.     Scheduled Meds: . albuterol  2.5 mg Nebulization Once  . clopidogrel  75 mg Oral Daily  . darunavir-cobicistat  1 tablet Oral Q breakfast  . emtricitabine-tenofovir  1 tablet Oral Daily  . feeding supplement (RESOURCE BREEZE)  1 Container Oral TID BM  . furosemide  80 mg Intravenous BID  . heparin  5,000 Units Subcutaneous 3 times per day  . heparin lock flush  500 Units Intracatheter Q30 days  . potassium chloride  40 mEq Oral BID  . sodium chloride  10-40 mL Intracatheter Q12H  . sodium chloride  3 mL Intravenous Q12H   Continuous Infusions: . DOBUTamine 5 mcg/kg/min (05/11/15 0700)  . norepinephrine (LEVOPHED) Adult infusion     PRN Meds:.heparin lock flush **AND** heparin lock flush, HYDROcodone-acetaminophen, ondansetron (ZOFRAN) IV, sodium chloride, traMADol    Filed Vitals:   05/11/15 0300 05/11/15 0400 05/11/15 0745 05/11/15 0800  BP: 62/46 64/44 77/55  79/56  Pulse:   89   Temp: 97.6 F (36.4 C)  97.4 F (36.3 C)   TempSrc: Oral  Oral   Resp: 14 15 11 13   Height:      Weight:      SpO2: 99%  99%     Intake/Output Summary (Last 24 hours) at 05/11/15 1053 Last data filed at 05/11/15 0800  Gross per 24 hour  Intake  535.5 ml  Output   1600 ml  Net -1064.5 ml    LABS: Basic Metabolic Panel:  Recent Labs  05/10/15 0415 05/11/15 0527  NA 136 135  K 4.2 3.9  CL 101 96*  CO2 25 29  GLUCOSE 100* 111*  BUN 19 16  CREATININE 1.30* 1.25*  CALCIUM 9.5 9.5   Liver Function Tests: No results for  input(s): AST, ALT, ALKPHOS, BILITOT, PROT, ALBUMIN in the last 72 hours. No results for input(s): LIPASE, AMYLASE in the last 72 hours. CBC:  Recent Labs  05/10/15 0415 05/11/15 0527  WBC 3.3* 2.9*  HGB 10.9* 10.6*  HCT 32.9* 31.6*  MCV 85.5 85.6  PLT 133* 143*   Cardiac Enzymes: No results for input(s): CKTOTAL, CKMB, CKMBINDEX, TROPONINI in the last 72 hours. BNP: Invalid input(s): POCBNP D-Dimer: No results for input(s): DDIMER in the last 72 hours. Hemoglobin A1C: No results for input(s): HGBA1C in the last 72 hours. Fasting Lipid Panel: No results for input(s): CHOL, HDL, LDLCALC, TRIG, CHOLHDL, LDLDIRECT in the last 72 hours. Thyroid Function Tests: No results for input(s): TSH, T4TOTAL, T3FREE, THYROIDAB in the last 72 hours.  Invalid input(s): FREET3 Anemia Panel: No results for input(s): VITAMINB12, FOLATE, FERRITIN, TIBC, IRON, RETICCTPCT in the last 72 hours.  RADIOLOGY: Dg Chest 2 View  05/07/2015   CLINICAL DATA:  Shortness of breath for 3 days.  EXAM: CHEST  2 VIEW  COMPARISON:  04/14/2015.  CT 02/03/2015  FINDINGS: Tip of the right chest port remains in the SVC. Unchanged dual lead left-sided pacemaker. There is unchanged elevation of  right hemidiaphragm. Minimal linear atelectasis at the right lung base. Cardiomediastinal contours are unchanged. No confluent consolidation, large pleural effusion or pneumothorax. Postsurgical change in the lower cervical spine palm partially included. No acute osseous abnormality. The lytic lesions on prior CT are not appreciated radiographically.  IMPRESSION: No acute pulmonary process.   Electronically Signed   By: Jeb Levering M.D.   On: 05/07/2015 01:16   Dg Chest 2 View  04/14/2015   CLINICAL DATA:  Chronic chest pain, with worsening dyspnea and pain over the past month.  EXAM: CHEST  2 VIEW  COMPARISON:  02/03/2015  FINDINGS: There is a right jugular Port-A-Cath with tip in the low SVC. There are intact appearances of the  transvenous pacing leads.  There is unchanged moderate right hemidiaphragm elevation. The lungs are clear. The pulmonary vasculature is normal. There are no pleural effusions. Hilar, mediastinal and cardiac contours are unremarkable and unchanged.  IMPRESSION: No active cardiopulmonary disease.   Electronically Signed   By: Andreas Newport M.D.   On: 04/14/2015 19:53    PHYSICAL EXAM General: Cachetic frail appearing + temporal wating. NAD Neck: JVP ~10 cm, no thyromegaly or thyroid nodule.  Lungs: Clear to auscultation bilaterally with normal respiratory effort. CV: Nondisplaced PMI.  Heart regular S1/S2, palpable s3, no murmur.  1-2+ edema to knees. Abdomen: Soft, nontender, + liver edge down no distention.  Neurologic: Alert and oriented x 3.  Psych: Normal affect. Extremities: No clubbing or cyanosis.   TELEMETRY: Reviewed telemetry pt in NSR 90-100s  ASSESSMENT AND PLAN: 69 yo with history of HIV and multiple myeloma. 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 He was admitted in 2/16 with chest pain, ?myopericarditis with mild troponin rise. LHC showed no significant coronary disease.Echo this admission showed EF 30% with diffuse hypokinesis (worse than prior). 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 IVC was very dilated. He has been persistently hypotensive.   1. Acute systolic CHF: EF about 12% by echo. LHC this admit ok. Concerned that he may have co-existing amyloidosis with cardiac involvement given the appearance of his myocardium and his clinical presentation with CHF, nonischemic cardiomyopathy. Fat pad biopsy done 6/3 Cannot do MRI with pacemaker.  2. HIV: CD4 count > 200.  Seen by pulmonary, think no active infection.  On meds.  3. Multiple myeloma: Has been treated per heme/onc, Dr Alen Blew. He has been offVelcade due to side effects.  He stopped pomalidomide a few weeks ago.   4. Elevated troponin: Suspect  demand ischemia from acute CHF. LHC in 2/16 without significant coronary disease.  5. Severe debility 6. Severe protein calorie malnutrition  CLEGG,AMY NP_C  05/11/2015  10:53 AM   Patient seen and examined with Darrick Grinder, NP. We discussed all aspects of the encounter. I agree with the assessment and plan as stated above.   Volume status improving but he remains very tenuous despite dobutamine support. I share Dr. Claris Gladden concern that he has cardiac amyloidosis. I spoke with Mr. Braddy about this at length and also explained that a negative biopsy does not exclude amyloidosis. We also discussed the fact that if this is symptomatic cardiac amyloidosis his prognosis is likely measured in terms of months.   Will continue dobutamine for now. Recheck co-ox. Continue lasix. BP too soft to titrate any HF meds and digoxin contraindicated with amyloid. Will await biopsy. If negative will need to decide on whether or not we will proceed with endomyocardial bx.  Will have PT and nutrition see. I suspect we will need to involve Palliative Care in near future.   Bensimhon, Daniel,MD 1:59 PM

## 2015-05-12 ENCOUNTER — Encounter (HOSPITAL_COMMUNITY): Payer: Self-pay | Admitting: Internal Medicine

## 2015-05-12 DIAGNOSIS — R570 Cardiogenic shock: Secondary | ICD-10-CM | POA: Insufficient documentation

## 2015-05-12 LAB — BASIC METABOLIC PANEL
ANION GAP: 9 (ref 5–15)
BUN: 15 mg/dL (ref 6–20)
CALCIUM: 9.5 mg/dL (ref 8.9–10.3)
CO2: 29 mmol/L (ref 22–32)
CREATININE: 1.21 mg/dL (ref 0.61–1.24)
Chloride: 98 mmol/L — ABNORMAL LOW (ref 101–111)
GFR calc Af Amer: >60 mL/min (ref >60)
Glucose, Bld: 121 mg/dL — ABNORMAL HIGH (ref 65–99)
Potassium: 3.7 mmol/L (ref 3.5–5.1)
SODIUM: 136 mmol/L (ref 135–145)

## 2015-05-12 LAB — CARBOXYHEMOGLOBIN
CARBOXYHEMOGLOBIN: 1.2 % (ref 0.5–1.5)
Methemoglobin: 1.3 % (ref 0.0–1.5)
O2 Saturation: 61.7 %
TOTAL HEMOGLOBIN: 11.1 g/dL — AB (ref 13.5–18.0)

## 2015-05-12 MED ORDER — SENNOSIDES-DOCUSATE SODIUM 8.6-50 MG PO TABS: 1.0000 | ORAL_TABLET | Freq: Every evening | ORAL | Status: DC | PRN

## 2015-05-12 MED ORDER — OXYCODONE HCL 5 MG PO TABS
5.0000 mg | ORAL_TABLET | ORAL | Status: DC | PRN
  Administered 2015-05-12 – 2015-05-15 (×5): 5 mg via ORAL
  Filled 2015-05-12 (×6): qty 1

## 2015-05-12 MED ORDER — ENOXAPARIN SODIUM 40 MG/0.4ML ~~LOC~~ SOLN
40.0000 mg | SUBCUTANEOUS | Status: DC
  Administered 2015-05-13: 40 mg via SUBCUTANEOUS
  Filled 2015-05-12 (×5): qty 0.4

## 2015-05-12 MED ORDER — SODIUM CHLORIDE 0.9 % IV SOLN
INTRAVENOUS | Status: DC
  Administered 2015-05-12: 250 mL via INTRAVENOUS
  Administered 2015-05-14: 10 mL via INTRAVENOUS

## 2015-05-12 NOTE — Progress Notes (Signed)
Nutrition Follow-up  DOCUMENTATION CODES:  Severe malnutrition in context of chronic illness  INTERVENTION:  Resource Breeze po TID, each supplement provides 250 kcal and 9 grams of protein  NUTRITION DIAGNOSIS:  Malnutrition related to chronic illness as evidenced by meal completion < 25%, severe depletion of body fat, severe depletion of muscle mass.  Ongoing  GOAL:  Patient will meet greater than or equal to 90% of their needs  Unmet  MONITOR:  PO intake, Supplement acceptance, Labs, Weight trends, Skin, I & O's  REASON FOR ASSESSMENT:  Malnutrition Screening Tool    ASSESSMENT: 69 year old male with hx HIV, multiple myeloma admitted 6/1 complaining of SOB with exertion and intermittent atypical chest pain. He is without hypoxemia on RA, however SOB persists.   RD received consult to assess nutritional needs and status. Pt met criteria for severe malnutrition in the context of chronic illness- refer to initial assessment completed on 05/08/15 for further details.   Pt s/p fat pad biospy and PICC line placement on 05/09/15. Per cardiology notes, concern for cardiac amyloidosis.   Pt is currently on lasix for fluid overload; wt has been stable since last visit.   Pt sitting in recliner at time of visit. He appears to be less weak from initial visit. He complains that his appetite remains poor and continues to have early satiety. He reports consuming only a half bowl of cereal at breakfast this morning. Meal completion 5-50% per doc flowsheets. He reports that he continues to have pain with swallowing initially, but resolves with continued intake.   Resource Breeze supplement at bedside table. Pt reports he is currently consuming 1-2 per day. Offered more nutrient dense supplements, however, pt reports those hurt his stomach and would like to continue with [employer]. RD encouraged pt to consume supplements and also eat food off meals trays to help promote nutritional  adequacy.   Noted palliative care consult for goals of care pending.   Height:  Ht Readings from Last 1 Encounters:  05/07/15 _0  (1.753 m)    Weight:  Wt Readings from Last 1 Encounters:  05/10/15 133 lb 4.8 oz (60.464 kg)    Ideal Body Weight:  72.7 kg  Wt Readings from Last 10 Encounters:  05/10/15 133 lb 4.8 oz (60.464 kg)  04/14/15 140 lb (63.504 kg)  03/07/15 133 lb 6.4 oz (60.51 kg)  02/03/15 128 lb (58.06 kg)  01/29/15 129 lb 1.6 oz (58.559 kg)  01/14/15 126 lb (57.153 kg)  12/24/14 132 lb 9.6 oz (60.147 kg)  12/09/14 135 lb (61.236 kg)  11/20/14 134 lb 1.6 oz (60.827 kg)  09/20/14 141 lb 8 oz (64.184 kg)    BMI:  Body mass index is 19.68 kg/(m^2).  Estimated Nutritional Needs:  Kcal:  1900-2100  Protein:  85-95 grams  Fluid:  1.9-2.1 L  Skin:  Reviewed, no issues  Diet Order:  Diet Heart Room service appropriate?: Yes; Fluid consistency:: Thin  EDUCATION NEEDS:  Education needs addressed   Intake/Output Summary (Last 24 hours) at 05/12/15 0955 Last data filed at 05/12/15 0916  Gross per 24 hour  Intake  730.3 ml  Output   1200 ml  Net -469.7 ml    Last BM:  05/08/15  Jaloni Davoli A. Jimmye Norman, RD, LDN, CDE Pager: [phone] After hours Pager: [phone]

## 2015-05-12 NOTE — Evaluation (Signed)
Physical Therapy Evaluation Patient Details Name: Rick Potter MRN: [id number] DOB: 07-17-1946 Today's Date: 05/12/2015   History of Present Illness  Rick Potter is a 69 y.o. male with h/o HIV, chronic chest pain, multiple myeloma in relapse, pacemaker. Patient presents to ED with c/o SOB primarily on any exertion. Pt with acute CHF  Clinical Impression  Rick Potter is very pleasant and reports he has had decline in function over the last year due to fatigue and lack of desire stopped cooking for himself and eats out for all meals. Pt educated for energy conservation, activity with CHF and activity modification. Pt with generalized fatigue and weakness with impaired balance with gait today who will benefit from acute therapy to maximize mobility, gait, balance and function to decrease burden of care.    Follow Up Recommendations Home health PT    Equipment Recommendations  Rolling walker with 5" wheels;3in1 (PT)    Recommendations for Other Services OT consult     Precautions / Restrictions Precautions Precautions: Fall      Mobility  Bed Mobility Overal bed mobility: Modified Independent                Transfers Overall transfer level: Modified independent                  Ambulation/Gait Ambulation/Gait assistance: Min guard Ambulation Distance (Feet): 150 Feet Assistive device: None Gait Pattern/deviations: Step-through pattern;Decreased stride length   Gait velocity interpretation: Below normal speed for age/gender General Gait Details: pt with unsteady gait with guarding for safety and balance. 3 standing rest breaks with cues for breathing technique and activity modification  Stairs            Wheelchair Mobility    Modified Rankin (Stroke Patients Only)       Balance Overall balance assessment: Needs assistance   Sitting balance-Leahy Scale: Good       Standing balance-Leahy Scale: Fair                                Pertinent Vitals/Pain Pain Assessment: No/denies pain  HR 105-122 99% RA BP 85/63 after activity    Home Living Family/patient expects to be discharged to:: Private residence Living Arrangements: Alone Available Help at Discharge: Family;Available PRN/intermittently Type of Home: Apartment Home Access: Level entry     Home Layout: One level Home Equipment: None      Prior Function Level of Independence: Needs assistance   Gait / Transfers Assistance Needed: pt reports not using anything to walk with but unable to walk more than 200' for over a month due to SOB and fatigue  ADL's / Homemaking Assistance Needed: niece and nephew help with meals at times and housework. pt with fatigue with aDLs and states it takes 1.5 hours to bathe , mostly drives to restaurants or fast food for meals for the last year        Hand Dominance        Extremity/Trunk Assessment   Upper Extremity Assessment: Generalized weakness           Lower Extremity Assessment: Generalized weakness      Cervical / Trunk Assessment: Normal  Communication   Communication: No difficulties  Cognition Arousal/Alertness: Awake/alert Behavior During Therapy: WFL for tasks assessed/performed Overall Cognitive Status: Within Functional Limits for tasks assessed  General Comments      Exercises General Exercises - Lower Extremity Long Arc Quad: AROM;Seated;Both;10 reps Hip Flexion/Marching: AROM;Seated;Both;10 reps      Assessment/Plan    PT Assessment Patient needs continued PT services  PT Diagnosis Difficulty walking;Generalized weakness   PT Problem List Decreased strength;Decreased activity tolerance;Decreased balance;Decreased mobility;Decreased knowledge of use of DME  PT Treatment Interventions Gait training;DME instruction;Functional mobility training;Therapeutic activities;Therapeutic exercise;Balance training;Patient/family education   PT Goals  (Current goals can be found in the Care Plan section) Acute Rehab PT Goals Patient Stated Goal: be able to care for himself and play scrabble PT Goal Formulation: With patient Time For Goal Achievement: 05/26/15 Potential to Achieve Goals: Good    Frequency Min 3X/week   Barriers to discharge Decreased caregiver support      Co-evaluation               End of Session   Activity Tolerance: Patient limited by fatigue Patient left: in chair;with call bell/phone within reach Nurse Communication: Mobility status         Time: 3437-3578 PT Time Calculation (min) (ACUTE ONLY): 28 min   Charges:   PT Evaluation $Initial PT Evaluation Tier I: 1 Procedure PT Treatments $Therapeutic Activity: 8-22 mins   PT G CodesMelford Aase 05/12/2015, 12:38 PM  Elwyn Reach, [place]

## 2015-05-12 NOTE — Progress Notes (Signed)
Patient ID: Rick Potter, male   DOB: 03-Dec-1946, 69 y.o.   MRN: [id number]   SUBJECTIVE:  6/3 had abdominal fat pad biopsy and PICC placement.    CO-OX 61%   with dobutamine gtt 5 mcg/kg/min.      Complains of mild dyspnea with exertion. Complaining of fatigue.       Scheduled Meds: . albuterol  2.5 mg Nebulization Once  . clopidogrel  75 mg Oral Daily  . darunavir-cobicistat  1 tablet Oral Q breakfast  . emtricitabine-tenofovir  1 tablet Oral Daily  . feeding supplement (RESOURCE BREEZE)  1 Container Oral TID BM  . furosemide  80 mg Intravenous BID  . heparin  5,000 Units Subcutaneous 3 times per day  . heparin lock flush  500 Units Intracatheter Q30 days  . potassium chloride  40 mEq Oral BID  . sodium chloride  10-40 mL Intracatheter Q12H  . sodium chloride  3 mL Intravenous Q12H   Continuous Infusions: . DOBUTamine 5 mcg/kg/min (05/12/15 0224)  . norepinephrine (LEVOPHED) Adult infusion     PRN Meds:.heparin lock flush **AND** heparin lock flush, HYDROcodone-acetaminophen, ondansetron (ZOFRAN) IV, sodium chloride, traMADol    Filed Vitals:   05/11/15 1800 05/11/15 1900 05/11/15 2300 05/12/15 0300  BP: 135/70 98/70 87/68  81/59  Pulse:    114  Temp:  97.6 F (36.4 C) 97.3 F (36.3 C) 98.1 F (36.7 C)  TempSrc:  Oral Oral Oral  Resp: 30 23 18 18   Height:      Weight:      SpO2:  100% 100% 97%    Intake/Output Summary (Last 24 hours) at 05/12/15 0737 Last data filed at 05/12/15 0400  Gross per 24 hour  Intake  826.5 ml  Output   1425 ml  Net -598.5 ml    LABS: Basic Metabolic Panel:  Recent Labs  05/11/15 0527 05/12/15 0608  NA 135 136  K 3.9 3.7  CL 96* 98*  CO2 29 29  GLUCOSE 111* 121*  BUN 16 15  CREATININE 1.25* 1.21  CALCIUM 9.5 9.5   Liver Function Tests: No results for input(s): AST, ALT, ALKPHOS, BILITOT, PROT, ALBUMIN in the last 72 hours. No results for input(s): LIPASE, AMYLASE in the last 72 hours. CBC:  Recent Labs  05/10/15 0415 05/11/15 0527  WBC 3.3* 2.9*  HGB 10.9* 10.6*  HCT 32.9* 31.6*  MCV 85.5 85.6  PLT 133* 143*   Cardiac Enzymes: No results for input(s): CKTOTAL, CKMB, CKMBINDEX, TROPONINI in the last 72 hours. BNP: Invalid input(s): POCBNP D-Dimer: No results for input(s): DDIMER in the last 72 hours. Hemoglobin A1C: No results for input(s): HGBA1C in the last 72 hours. Fasting Lipid Panel: No results for input(s): CHOL, HDL, LDLCALC, TRIG, CHOLHDL, LDLDIRECT in the last 72 hours. Thyroid Function Tests: No results for input(s): TSH, T4TOTAL, T3FREE, THYROIDAB in the last 72 hours.  Invalid input(s): FREET3 Anemia Panel: No results for input(s): VITAMINB12, FOLATE, FERRITIN, TIBC, IRON, RETICCTPCT in the last 72 hours.  RADIOLOGY: Dg Chest 2 View  05/07/2015   CLINICAL DATA:  Shortness of breath for 3 days.  EXAM: CHEST  2 VIEW  COMPARISON:  04/14/2015.  CT 02/03/2015  FINDINGS: Tip of the right chest port remains in the SVC. Unchanged dual lead left-sided pacemaker. There is unchanged elevation of right hemidiaphragm. Minimal linear atelectasis at the right lung base. Cardiomediastinal contours are unchanged. No confluent consolidation, large pleural effusion or pneumothorax. Postsurgical change in the lower cervical spine palm partially included. No  acute osseous abnormality. The lytic lesions on prior CT are not appreciated radiographically.  IMPRESSION: No acute pulmonary process.   Electronically Signed   By: Jeb Levering M.D.   On: 05/07/2015 01:16   Dg Chest 2 View  04/14/2015   CLINICAL DATA:  Chronic chest pain, with worsening dyspnea and pain over the past month.  EXAM: CHEST  2 VIEW  COMPARISON:  02/03/2015  FINDINGS: There is a right jugular Port-A-Cath with tip in the low SVC. There are intact appearances of the transvenous pacing leads.  There is unchanged moderate right hemidiaphragm elevation. The lungs are clear. The pulmonary vasculature is normal. There are no  pleural effusions. Hilar, mediastinal and cardiac contours are unremarkable and unchanged.  IMPRESSION: No active cardiopulmonary disease.   Electronically Signed   By: Andreas Newport M.D.   On: 04/14/2015 19:53    PHYSICAL EXAM CVP 5.  General: Cachetic frail appearing + temporal wating. NAD Sitting on the side of the bed.  Neck: JVP ~5 cm, no thyromegaly or thyroid nodule.  Lungs: Clear to auscultation bilaterally with normal respiratory effort. CV: Nondisplaced PMI.  Heart regular S1/S2, palpable s3, no murmur.  1-2+ edema to knees. Abdomen: Soft, nontender, + liver edge down no distention.  Neurologic: Alert and oriented x 3.  Psych: Normal affect. Extremities: No clubbing or cyanosis.   TELEMETRY: Reviewed telemetry pt in NSR 90-100s  ASSESSMENT AND PLAN: 69 yo with history of HIV and multiple myeloma. 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 He was admitted in 2/16 with chest pain, ?myopericarditis with mild troponin rise. LHC showed no significant coronary disease.Echo this admission showed EF 30% with diffuse hypokinesis (worse than prior). 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 IVC was very dilated. He has been persistently hypotensive.   1. Acute systolic CHF: EF about 89% by echo. LHC this admit ok. Concerned that he may have co-existing amyloidosis with cardiac involvement given the appearance of his myocardium and his clinical presentation with CHF, nonischemic cardiomyopathy. Fat pad biopsy done 6/3. Results pending.  Cannot do MRI with pacemaker.  On dobutamine 5 mcg . Dig contraindicated. CVP improving Continue IV lasix. Renal function stable.  2. HIV: CD4 count > 200.  Seen by pulmonary.  On meds.  3. Multiple myeloma: Has been treated per heme/onc, Dr Alen Blew. He has been offVelcade due to side effects.  He stopped pomalidomide a few weeks ago.   4. Elevated troponin: Suspect demand ischemia from  acute CHF. LHC in 2/16 without significant coronary disease.  5. Severe debility- PT following.  6. Severe protein calorie malnutrition  Consult Plliative Care for goals of care. He is agreeable to consult.   CLEGG,AMY NP_C  05/12/2015  7:37 AM  Patient seen and examined with Darrick Grinder, NP. We discussed all aspects of the encounter. I agree with the assessment and plan as stated above.   Volume status much improved. CVP now 4-5. Will hold lasix tonight. SBP remains soft despite dobutamine.   Fat pad biopsy negative. 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 Endomyocardial biopsy will likely not change management so will not pursue.   Appreciate Palliative Care input. He is now DNR. Will have meeting with nieces and nephews later this week. Hopefully home on Wednesday with home dobutamine.   Kelsea Mousel,MD 5:41 PM

## 2015-05-12 NOTE — Care Management Note (Signed)
Case Management Note  Patient Details  Name: Rick Potter MRN: [id number] Date of Birth: 1946/06/27  Subjective/Objective:                    Action/Plan:   Expected Discharge Date:                  Expected Discharge Plan:  Home/Self Care  In-House Referral:     Discharge planning Services     Post Acute Care Choice:    Choice offered to:     DME Arranged:    DME Agency:     HH Arranged:    [place] Agency:     Status of Service:     Medicare Important Message Given:  Yes Date Medicare IM Given:  05/12/15 Medicare IM give by:  debbie Kerryann Allaire rn,bsn Date Additional Medicare IM Given:    Additional Medicare Important Message give by:     If discussed at [place] of Stay Meetings, dates discussed:  05/13/15  Additional Comments:  Lacretia Leigh, RN 05/12/2015, 11:41 AM

## 2015-05-12 NOTE — Consult Note (Addendum)
Consultation Note Date: 05/12/2015   Patient Name: Rick Potter  DOB: [birth year]  MRN: [id number]  Age / Sex: 69 y.o., male   PCP: [name], MD Referring Physician: Larey Dresser, MD  Reason for Consultation: Establishing goals of care  Palliative Care Assessment and Plan Summary of Established Goals of Care and Medical Treatment Preferences   Clinical Assessment/Narrative:  69 yo gentleman with HIV, multiple myeloma, severe protein calorie malnutrition, admitted with chest pain, dysnpea, acute systolic CHF exacerbation. Fat pad biopsy negative for amyloidosis. Patient with ongoing fatigue, dyspnea while talking and chest pain. Patient on dobutamine infusion. Palliative consulted for code status and goals of care discussions.    Contacts/Participants in Discussion: Primary Decision Maker: patient enlists his nephew Rick Potter as his HCPOA agent, unknown status of actual document, Nason Conradt is not listed in patient's contacts, patient also mentioned his niece Rick Potter as co HCPOA agent, unable to reach [place] by phone. Patient given information on how to contact the palliative team for setting up a family meeting with the patient and his family for further goals of care discussions.   HCPOA: yes   unknown status of actual document.   Code Status/Advance Care Planning:  Discussed code status with patient. He states that he is at peace with his conditions and with the possibility of high risk of dying within the next few weeks-months. He would not want to be placed on ventilator, he would not want to undergo CPR or defibrillation. He is in full consent to establish code status of DNR DNI.   Patient states he has a living will. Will ask for copy, will discuss in family meeting.   Symptom Management:   Pain: chest pain, dyspnea: PRN Opioids.   Constipation: bowel regimen.   Dyspnea: PRN opioids.   Additional Recommendations (Limitations, Scope,  Preferences):  Discussed with Dr Haroldine Laws, family meeting with patient, his niece, nephews and his sisters for further discussions regarding goals of care for current hospitalization as well as for when he is discharged.  Psycho-social/Spiritual:   Support System: niece and nephew who live locally. Patient moved from [place], [place] 1 year ago to be closer to his family.   Desire for further Chaplaincy support:no  Prognosis: weeks-months  Discharge Planning:  Under further discussions. Did address initiation of Hospice services and discharge home with hospice option with the patient. All questions answered.    Domains of Care: - Physical: chest pain, dyspnea, fatigue, anorexia, constipation.  - Psychological: mood congruent, at peace with his condition and possible prognosis of weeks-months.  - Social: lives alone in [place], [place]. Moved from [place], [place] 1 yr ago to be closer to family, has niece and nephew who are involved in his care.  - Spiritual: christian faith, no acute issues noted, patient at peace with his condition and potential limited prognosis. " We are all just passing through, so I know there's a time for me to go on." - Cultural: no spouse, no kids. 6 brothers are deceased. Has nieces and nephews, has 1 brother and 2 sisters. Was born in [place]. Lived in [place], [place] for close to 41 years, worked in Insurance underwriter.  - Imminently dying: no - Ethical/Legal: code status after discussions has been established as DNR DNI  Values: Patient values being independent for as long as possible and being out of pain at the end of life.  Life limiting illness: heart failure, systolic, acute in the setting of possible cardiac amyloidosis, underlying myeloma with diffuse  skeletal metastases.       Chief Complaint/History of Present Illness:  69 yo male with HIV, myeloma admitted with dyspnea, failure to thrive, weakness, weight loss. Concern for possible cardiac amyloidosis. Patient on  dopamine infusion. Palliative consulted for goals of care and code status discussions.   Primary Diagnoses  Present on Admission:  . Multiple myeloma in relapse . HIV disease . Elevated troponin . SOB (shortness of breath) . Protein-calorie malnutrition, severe . DOE (dyspnea on exertion)  Palliative Review of Systems:  1. Patient complaining of fatigue.    Advised pacing himself through the day, conservation of energy, light movements as he can tolerate, symptom management of pain and dyspnea. Discussed scope of fatigue in the setting of his myeloma and CHF.  2. Constipation: this is a new problem, monitor bowel regimen.  3. Chest pain , dyspnea: add low dose PRN opioids for symptom management.  4. Lack of appetite:  Advised small meals, patient working on his Boost, discussed cachexia anorexia in the setting of chronic illnesses and multiple co morbidities.   I have reviewed the medical record, interviewed the patient and family, and examined the patient. The following aspects are pertinent.  Past Medical History  Diagnosis Date  . HIV infection dx'd 1990    Viral load undetectable in January with CD4 count of 340  . Hyperlipidemia     Patient denies this hx on 02/04/2015  . Presence of permanent cardiac pacemaker   . TIA (transient ischemic attack) ~ 2005  . Borderline type 2 diabetes mellitus     "Borderline" under surveillance, no medical therapy  . Multiple myeloma dx'd 2011   History   Social History  . Marital Status: Single    Spouse Name: N/A  . Number of Children: N/A  . Years of Education: N/A   Occupational History  . 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 Smoking status: Former Smoker -- 0.50 packs/day for 35 years    Types: Cigarettes    Quit date: 12/06/1997  . Smokeless tobacco: Never Used  . Alcohol Use: No  . Drug Use: Yes    Special: Marijuana     Comment:  "stopped smoking recreational marijuana ~ 2005"  . Sexual Activity: No     Comment: declined condoms   Other Topics Concern  . None   Social History Narrative   He is not married.  He does not have any children.  Has been in [place] for about 10 months.  Relocated from [place].   Family History  Problem Relation Age of Onset  . Stroke Mother   . Cancer Sister   . Diabetes Brother   . Alcohol abuse Brother    Scheduled Meds: . albuterol  2.5 mg Nebulization Once  . clopidogrel  75 mg Oral Daily  . darunavir-cobicistat  1 tablet Oral Q breakfast  . emtricitabine-tenofovir  1 tablet Oral Daily  . feeding supplement (RESOURCE BREEZE)  1 Container Oral TID BM  . furosemide  80 mg Intravenous BID  . heparin  5,000 Units Subcutaneous 3 times per day  . heparin lock flush  500 Units Intracatheter Q30 days  . potassium chloride  40 mEq Oral BID  . sodium chloride  10-40 mL Intracatheter Q12H  . sodium chloride  3 mL Intravenous Q12H   Continuous Infusions: . DOBUTamine 5 mcg/kg/min (05/12/15 0700)  . norepinephrine (  LEVOPHED) Adult infusion     PRN Meds:.heparin lock flush **AND** heparin lock flush, HYDROcodone-acetaminophen, ondansetron (ZOFRAN) IV, sodium chloride, traMADol Medications Prior to Admission:  Prior to Admission medications   Medication Sig Start Date End Date Taking? Authorizing Provider  clopidogrel (PLAVIX) 75 MG tablet Take 75 mg by mouth daily.   Yes Historical Provider, MD  darunavir-cobicistat (PREZCOBIX) 800-150 MG per tablet Take 1 tablet by mouth daily. Swallow whole. Do NOT crush, break or chew tablets. Take with food.   Yes Historical Provider, MD  emtricitabine-tenofovir (TRUVADA) 200-300 MG per tablet Take 1 tablet by mouth daily. 09/02/14  Yes Carlyle Basques, MD  HYDROcodone-acetaminophen [facility]) 10-325 MG per tablet Take 1 tablet by mouth 2 (two) times daily as needed for moderate pain.   Yes Historical Provider, MD   No Known Allergies CBC:    Component  Value Date/Time   WBC 2.9* 05/11/2015 0527   WBC 3.1* 01/29/2015 1401   HGB 10.6* 05/11/2015 0527   HGB 11.0* 01/29/2015 1401   HCT 31.6* 05/11/2015 0527   HCT 34.1* 01/29/2015 1401   PLT 143* 05/11/2015 0527   PLT 137* 01/29/2015 1401   MCV 85.6 05/11/2015 0527   MCV 90.3 01/29/2015 1401   NEUTROABS 1.0* 04/14/2015 1858   NEUTROABS 0.9* 01/29/2015 1401   LYMPHSABS 1.7 04/14/2015 1858   LYMPHSABS 1.7 01/29/2015 1401   MONOABS 0.3 04/14/2015 1858   MONOABS 0.4 01/29/2015 1401   EOSABS 0.0 04/14/2015 1858   EOSABS 0.0 01/29/2015 1401   BASOSABS 0.0 04/14/2015 1858   BASOSABS 0.0 01/29/2015 1401   Comprehensive Metabolic Panel:    Component Value Date/Time   NA 136 05/12/2015 0608   NA 140 01/29/2015 1401   K 3.7 05/12/2015 0608   K 3.8 01/29/2015 1401   CL 98* 05/12/2015 0608   CO2 29 05/12/2015 0608   CO2 24 01/29/2015 1401   BUN 15 05/12/2015 0608   BUN 8.1 01/29/2015 1401   CREATININE 1.21 05/12/2015 0608   CREATININE 0.7 01/29/2015 1401   CREATININE 0.91 12/09/2014 1100   GLUCOSE 121* 05/12/2015 0608   GLUCOSE 91 01/29/2015 1401   CALCIUM 9.5 05/12/2015 0608   CALCIUM 9.0 01/29/2015 1401   AST 48* 05/07/2015 0330   AST 22 01/29/2015 1401   ALT 39 05/07/2015 0330   ALT 33 01/29/2015 1401   ALKPHOS 76 05/07/2015 0330   ALKPHOS 55 01/29/2015 1401   BILITOT 1.3* 05/07/2015 0330   BILITOT 0.75 01/29/2015 1401   PROT 5.9* 05/07/2015 0330   PROT 5.4* 01/29/2015 1401   ALBUMIN 3.9 05/07/2015 0330   ALBUMIN 3.5 01/29/2015 1401    Physical Exam: Vital Signs: BP 79/63 mmHg  Pulse 114  Temp(Src) 97.5 F (36.4 C) (Oral)  Resp 13  Ht 5' 9"  (1.753 m)  Wt 60.464 kg (133 lb 4.8 oz)  BMI 19.68 kg/m2  SpO2 98% SpO2: SpO2: 98 % O2 Device: O2 Device: Not Delivered O2 Flow Rate: O2 Flow Rate (L/min): 2 L/min Intake/output summary:  Intake/Output Summary (Last 24 hours) at 05/12/15 1427 Last data filed at 05/12/15 1400  Gross per 24 hour  Intake  830.3 ml  Output    1600 ml  Net -769.7 ml   LBM: Last BM Date: 05/08/15 Baseline Weight: Weight: 60.8 kg (134 lb 0.6 oz) Most recent weight: Weight: 60.464 kg (133 lb 4.8 oz) Past 24 hour pain scores= 3-5/10, chest pain    Exam Findings:  Elderly appearing cachectic AA gentleman resting in chair, NAD as  long as he is resting in chair, dyspneic with prolonged conversations.  Clear anterior diminished bases S1 S2 Abdomen soft non tender Extremities with wasting trace edema. Awake alert oriented.             Palliative Performance Scale: 30%              Additional Data Reviewed: Recent Labs     05/10/15  0415  05/11/15  0527  05/12/15  0608  WBC  3.3*  2.9*   --   HGB  10.9*  10.6*   --   PLT  133*  143*   --   NA  136  135  136  BUN  19  16  15   CREATININE  1.30*  1.25*  1.21     Time In: 1400 Time Out: 1530 Time Total: 90 minutes.  Greater than 50%  of this time was spent counseling and coordinating care related to the above assessment and plan.  Signed by: Loistine Chance, MD  Loistine Chance, MD  05/12/2015, 2:27 PM  Please contact Palliative Medicine Team phone at [phone] for questions and concerns.

## 2015-05-13 DIAGNOSIS — Z515 Encounter for palliative care: Secondary | ICD-10-CM | POA: Insufficient documentation

## 2015-05-13 LAB — BASIC METABOLIC PANEL
Anion gap: 9 (ref 5–15)
BUN: 11 mg/dL (ref 6–20)
CO2: 29 mmol/L (ref 22–32)
CREATININE: 1.13 mg/dL (ref 0.61–1.24)
Calcium: 9.7 mg/dL (ref 8.9–10.3)
Chloride: 96 mmol/L — ABNORMAL LOW (ref 101–111)
Glucose, Bld: 123 mg/dL — ABNORMAL HIGH (ref 65–99)
Potassium: 3.7 mmol/L (ref 3.5–5.1)
SODIUM: 134 mmol/L — AB (ref 135–145)

## 2015-05-13 LAB — CARBOXYHEMOGLOBIN
Carboxyhemoglobin: 0.7 % (ref 0.5–1.5)
Methemoglobin: 1 % (ref 0.0–1.5)
O2 SAT: 49.3 %
Total hemoglobin: 11.2 g/dL — ABNORMAL LOW (ref 13.5–18.0)

## 2015-05-13 MED ORDER — SENNOSIDES-DOCUSATE SODIUM 8.6-50 MG PO TABS
1.0000 | ORAL_TABLET | Freq: Two times a day (BID) | ORAL | Status: DC
Start: 2015-05-13 — End: 2015-05-16
  Administered 2015-05-13 – 2015-05-16 (×7): 1 via ORAL
  Filled 2015-05-13 (×7): qty 1

## 2015-05-13 MED ORDER — FUROSEMIDE 10 MG/ML IJ SOLN
80.0000 mg | Freq: Once | INTRAMUSCULAR | Status: AC
  Administered 2015-05-13: 80 mg via INTRAVENOUS

## 2015-05-13 NOTE — Progress Notes (Signed)
Spoke with Darrick Grinder about patient BP. Will continue monitor BP closely at this time.

## 2015-05-13 NOTE — Progress Notes (Addendum)
Progress Note from the Palliative Medicine Team at [facility]: 69 yo gentleman with HIV, multiple myeloma, severe protein calorie malnutrition, admitted with chest pain, dysnpea, acute systolic CHF exacerbation. Fat pad biopsy negative for amyloidosis. Patient with ongoing fatigue, dyspnea while talking and chest pain. Patient on dobutamine infusion. Palliative consulted for code status and goals of care discussions.   Initial palliative consult completed on 05-12-15. Code status changed to DNR.   05-13-15: Patient resting in chair, continues to appear weak, fatigued. Anorexia continues, unable to eat much. Ongoing chest discomfort episodic as well as ongoing dyspnea.   The patient remains with low BP, on drip. Cardiology is following. The patient wishes for current gentle treatment measures to continue, to attempt for some degree of medical stabilization to what ever extent possible.   He is arranging for his loved ones to be present for a family meeting with palliative providers within the next 24 hours. Information given to the patient on how to contact the team. We will continue to assist.      Objective: No Known Allergies Scheduled Meds: . albuterol  2.5 mg Nebulization Once  . clopidogrel  75 mg Oral Daily  . darunavir-cobicistat  1 tablet Oral Q breakfast  . emtricitabine-tenofovir  1 tablet Oral Daily  . enoxaparin (LOVENOX) injection  40 mg Subcutaneous Q24H  . feeding supplement (RESOURCE BREEZE)  1 Container Oral TID BM  . heparin lock flush  500 Units Intracatheter Q30 days  . potassium chloride  40 mEq Oral BID  . sodium chloride  10-40 mL Intracatheter Q12H  . sodium chloride  3 mL Intravenous Q12H   Continuous Infusions: . sodium chloride 6 mL/hr at 05/13/15 0800  . DOBUTamine 5 mcg/kg/min (05/13/15 0800)  . norepinephrine (LEVOPHED) Adult infusion     PRN Meds:.heparin lock flush **AND** heparin lock flush, ondansetron (ZOFRAN) IV, oxyCODONE,  senna-docusate, sodium chloride, traMADol  BP 78/50 mmHg  Pulse 101  Temp(Src) 97.8 F (36.6 C) (Oral)  Resp 13  Ht 5' 9"  (1.753 m)  Wt 60.464 kg (133 lb 4.8 oz)  BMI 19.68 kg/m2  SpO2 99%   PPS: 30%  Pain Score: 4-5/10 Pain Location: chest, both arms.    Intake/Output Summary (Last 24 hours) at 05/13/15 1309 Last data filed at 05/13/15 1200  Gross per 24 hour  Intake  345.8 ml  Output    700 ml  Net -354.2 ml      LBM: 3 days ago   Stool Softener: yes, will adjust bowel regimen.   Physical Exam:  General: weak fatigue evident.  HEENT: moist oral mucosa Chest:   clear CVS: S1 S2 Abdomen: soft non tender Ext: no edema Neuro: AAO  Labs: CBC    Component Value Date/Time   WBC 2.9* 05/11/2015 0527   WBC 3.1* 01/29/2015 1401   RBC 3.69* 05/11/2015 0527   RBC 3.77* 01/29/2015 1401   HGB 10.6* 05/11/2015 0527   HGB 11.0* 01/29/2015 1401   HCT 31.6* 05/11/2015 0527   HCT 34.1* 01/29/2015 1401   PLT 143* 05/11/2015 0527   PLT 137* 01/29/2015 1401   MCV 85.6 05/11/2015 0527   MCV 90.3 01/29/2015 1401   MCH 28.7 05/11/2015 0527   MCH 29.0 01/29/2015 1401   MCHC 33.5 05/11/2015 0527   MCHC 32.2 01/29/2015 1401   RDW 14.4 05/11/2015 0527   RDW 13.8 01/29/2015 1401   LYMPHSABS 1.7 04/14/2015 1858   LYMPHSABS 1.7 01/29/2015 1401   MONOABS 0.3 04/14/2015 1858  MONOABS 0.4 01/29/2015 1401   EOSABS 0.0 04/14/2015 1858   EOSABS 0.0 01/29/2015 1401   BASOSABS 0.0 04/14/2015 1858   BASOSABS 0.0 01/29/2015 1401    BMET    Component Value Date/Time   NA 134* 05/13/2015 0520   NA 140 01/29/2015 1401   K 3.7 05/13/2015 0520   K 3.8 01/29/2015 1401   CL 96* 05/13/2015 0520   CO2 29 05/13/2015 0520   CO2 24 01/29/2015 1401   GLUCOSE 123* 05/13/2015 0520   GLUCOSE 91 01/29/2015 1401   BUN 11 05/13/2015 0520   BUN 8.1 01/29/2015 1401   CREATININE 1.13 05/13/2015 0520   CREATININE 0.7 01/29/2015 1401   CREATININE 0.91 12/09/2014 1100   CALCIUM 9.7 05/13/2015 0520    CALCIUM 9.0 01/29/2015 1401   GFRNONAA >60 05/13/2015 0520   GFRNONAA 88 12/09/2014 1100   GFRAA >60 05/13/2015 0520   GFRAA >89 12/09/2014 1100    CMP     Component Value Date/Time   NA 134* 05/13/2015 0520   NA 140 01/29/2015 1401   K 3.7 05/13/2015 0520   K 3.8 01/29/2015 1401   CL 96* 05/13/2015 0520   CO2 29 05/13/2015 0520   CO2 24 01/29/2015 1401   GLUCOSE 123* 05/13/2015 0520   GLUCOSE 91 01/29/2015 1401   BUN 11 05/13/2015 0520   BUN 8.1 01/29/2015 1401   CREATININE 1.13 05/13/2015 0520   CREATININE 0.7 01/29/2015 1401   CREATININE 0.91 12/09/2014 1100   CALCIUM 9.7 05/13/2015 0520   CALCIUM 9.0 01/29/2015 1401   PROT 5.9* 05/07/2015 0330   PROT 5.4* 01/29/2015 1401   ALBUMIN 3.9 05/07/2015 0330   ALBUMIN 3.5 01/29/2015 1401   AST 48* 05/07/2015 0330   AST 22 01/29/2015 1401   ALT 39 05/07/2015 0330   ALT 33 01/29/2015 1401   ALKPHOS 76 05/07/2015 0330   ALKPHOS 55 01/29/2015 1401   BILITOT 1.3* 05/07/2015 0330   BILITOT 0.75 01/29/2015 1401   GFRNONAA >60 05/13/2015 0520   GFRNONAA 88 12/09/2014 1100   GFRAA >60 05/13/2015 0520   GFRAA >89 12/09/2014 1100      Assessment and Plan: 1. Code Status: DNR 2. Symptom Control: continue PRNs for chest pain and dyspnea, consider scheduled OxyContin within the next 24-48 hours. Change senokot to scheduled for constipation and monitor.   3. Psycho/Social: lives alone, states he has nieces nephews and sisters who live locally who will assist him post this hospitalization. We need to have a family meeting with all concerned parties soon. We need to initiate hospice consult after we meet with the patient's family 4. Spiritual: christian faith, at peace with his current illness.  5. Disposition: to be determined, patient with ongoing clinical decompensation. If hospice chosen after family meeting, will determine if it is realistic to attempt home with hospice on discharge or to consider transfer to inpatient  hospice.      Time In 11:30 Time Out 1200 Total Time Spent with Patient 30 min Total Overall Time 45 min         Greater than 50%  of this time was spent counseling and coordinating care related to the above assessment and plan.   Loistine Chance, MD Palliative Medicine Team.  Please contact Palliative Medicine Team phone at [phone] for questions and concerns.

## 2015-05-13 NOTE — Progress Notes (Signed)
Patient ID: Rick Potter, male   DOB: 08/14/1946, 69 y.o.   MRN: [id number]   SUBJECTIVE:  6/3 had abdominal fat pad biopsy and PICC placement.    CO-OX 49%  with dobutamine gtt 5 mcg/kg/min.  CVP 12. SBP in the 70-80s.  Yesterday diuretics stopped.   Dyspneic at rest.     Scheduled Meds: . albuterol  2.5 mg Nebulization Once  . clopidogrel  75 mg Oral Daily  . darunavir-cobicistat  1 tablet Oral Q breakfast  . emtricitabine-tenofovir  1 tablet Oral Daily  . enoxaparin (LOVENOX) injection  40 mg Subcutaneous Q24H  . feeding supplement (RESOURCE BREEZE)  1 Container Oral TID BM  . heparin lock flush  500 Units Intracatheter Q30 days  . potassium chloride  40 mEq Oral BID  . sodium chloride  10-40 mL Intracatheter Q12H  . sodium chloride  3 mL Intravenous Q12H   Continuous Infusions: . sodium chloride 250 mL (05/12/15 1600)  . DOBUTamine 5 mcg/kg/min (05/12/15 2000)  . norepinephrine (LEVOPHED) Adult infusion     PRN Meds:.heparin lock flush **AND** heparin lock flush, ondansetron (ZOFRAN) IV, oxyCODONE, senna-docusate, sodium chloride, traMADol    Filed Vitals:   05/13/15 0500 05/13/15 0600 05/13/15 0700 05/13/15 0749  BP: 72/48 82/64 80/60   Pulse: 105 100 103   Temp:      TempSrc:      Resp: 12 12 9 16  Height:      Weight:      SpO2: 100% 99% 100%     Intake/Output Summary (Last 24 hours) at 05/13/15 0758 Last data filed at 05/13/15 0700  Gross per 24 hour  Intake  570.4 ml  Output   1000 ml  Net -429.6 ml    LABS: Basic Metabolic Panel:  Recent Labs  05/12/15 0608 05/13/15 0520  NA 136 134*  K 3.7 3.7  CL 98* 96*  CO2 29 29  GLUCOSE 121* 123*  BUN 15 11  CREATININE 1.21 1.13  CALCIUM 9.5 9.7   Liver Function Tests: No results for input(s): AST, ALT, ALKPHOS, BILITOT, PROT, ALBUMIN in the last 72 hours. No results for input(s): LIPASE, AMYLASE in the last 72 hours. CBC:  Recent Labs  05/11/15 0527  WBC 2.9*  HGB 10.6*  HCT 31.6*  MCV  85.6  PLT 143*   Cardiac Enzymes: No results for input(s): CKTOTAL, CKMB, CKMBINDEX, TROPONINI in the last 72 hours. BNP: Invalid input(s): POCBNP D-Dimer: No results for input(s): DDIMER in the last 72 hours. Hemoglobin A1C: No results for input(s): HGBA1C in the last 72 hours. Fasting Lipid Panel: No results for input(s): CHOL, HDL, LDLCALC, TRIG, CHOLHDL, LDLDIRECT in the last 72 hours. Thyroid Function Tests: No results for input(s): TSH, T4TOTAL, T3FREE, THYROIDAB in the last 72 hours.  Invalid input(s): FREET3 Anemia Panel: No results for input(s): VITAMINB12, FOLATE, FERRITIN, TIBC, IRON, RETICCTPCT in the last 72 hours.  RADIOLOGY: Dg Chest 2 View  05/07/2015   CLINICAL DATA:  Shortness of breath for 3 days.  EXAM: CHEST  2 VIEW  COMPARISON:  04/14/2015.  CT 02/03/2015  FINDINGS: Tip of the right chest port remains in the SVC. Unchanged dual lead left-sided pacemaker. There is unchanged elevation of right hemidiaphragm. Minimal linear atelectasis at the right lung base. Cardiomediastinal contours are unchanged. No confluent consolidation, large pleural effusion or pneumothorax. Postsurgical change in the lower cervical spine palm partially included. No acute osseous abnormality. The lytic lesions on prior CT are not appreciated radiographically.  IMPRESSION: No   acute pulmonary process.   Electronically Signed   By: Melanie  Ehinger M.D.   On: 05/07/2015 01:16   Dg Chest 2 View  04/14/2015   CLINICAL DATA:  Chronic chest pain, with worsening dyspnea and pain over the past month.  EXAM: CHEST  2 VIEW  COMPARISON:  02/03/2015  FINDINGS: There is a right jugular Port-A-Cath with tip in the low SVC. There are intact appearances of the transvenous pacing leads.  There is unchanged moderate right hemidiaphragm elevation. The lungs are clear. The pulmonary vasculature is normal. There are no pleural effusions. Hilar, mediastinal and cardiac contours are unremarkable and unchanged.  IMPRESSION:  No active cardiopulmonary disease.   Electronically Signed   By: Daniel R Mitchell M.D.   On: 04/14/2015 19:53   Ir Us Guide Vasc Access Right  05/12/2015   CLINICAL DATA:  Amylase  EXAM: RIGHT UPPER EXTREMITY PICC LINE PLACEMENT WITH ULTRASOUND AND FLUOROSCOPIC GUIDANCE. ULTRASOUND GUIDANCE FOR CORE FAT PAD BIOPSY.  FLUOROSCOPY TIME:  42 seconds  PROCEDURE: The patient was advised of the possible risks and complications and agreed to undergo the procedure. The patient was then brought to the angiographic suite for the procedure.  The right arm was prepped with chlorhexidine, draped in the usual sterile fashion using maximum barrier technique (cap and mask, sterile gown, sterile gloves, large sterile sheet, hand hygiene and cutaneous antisepsis) and infiltrated locally with 1% Lidocaine.  Ultrasound demonstrated patency of the right basilic vein, and this was documented with an image. Under real-time ultrasound guidance, this vein was accessed with a 21 gauge micropuncture needle and image documentation was performed. A 0.018 wire was introduced in to the vein. Over this, a 5 French double lumen power PICC was advanced to the lower SVC/right atrial junction. Fluoroscopy during the procedure and fluoro spot radiograph confirms appropriate catheter position. The catheter was flushed and covered with a sterile dressing.  The right lower quadrant was prepped and draped in a sterile fashion. 1% lidocaine was utilized for local anesthesia. Under sonographic guidance, 5 18 gauge core biopsies of the subcutaneous fat were obtained.  COMPLICATIONS: None  LENGTH: 41 cm  IMPRESSION: Successful right arm power PICC line placement with ultrasound and fluoroscopic guidance. The catheter is ready for use.  Successful fat pad core biopsy.   Electronically Signed   By: Arthur  Hoss M.D.   On: 05/12/2015 07:51   Ir Us Guide Bx Asp/drain  05/12/2015   CLINICAL DATA:  Amylase  EXAM: RIGHT UPPER EXTREMITY PICC LINE PLACEMENT WITH  ULTRASOUND AND FLUOROSCOPIC GUIDANCE. ULTRASOUND GUIDANCE FOR CORE FAT PAD BIOPSY.  FLUOROSCOPY TIME:  42 seconds  PROCEDURE: The patient was advised of the possible risks and complications and agreed to undergo the procedure. The patient was then brought to the angiographic suite for the procedure.  The right arm was prepped with chlorhexidine, draped in the usual sterile fashion using maximum barrier technique (cap and mask, sterile gown, sterile gloves, large sterile sheet, hand hygiene and cutaneous antisepsis) and infiltrated locally with 1% Lidocaine.  Ultrasound demonstrated patency of the right basilic vein, and this was documented with an image. Under real-time ultrasound guidance, this vein was accessed with a 21 gauge micropuncture needle and image documentation was performed. A 0.018 wire was introduced in to the vein. Over this, a 5 French double lumen power PICC was advanced to the lower SVC/right atrial junction. Fluoroscopy during the procedure and fluoro spot radiograph confirms appropriate catheter position. The catheter was flushed and covered with a   sterile dressing.  The right lower quadrant was prepped and draped in a sterile fashion. 1% lidocaine was utilized for local anesthesia. Under sonographic guidance, 5 18 gauge core biopsies of the subcutaneous fat were obtained.  COMPLICATIONS: None  LENGTH: 41 cm  IMPRESSION: Successful right arm power PICC line placement with ultrasound and fluoroscopic guidance. The catheter is ready for use.  Successful fat pad core biopsy.   Electronically Signed   By: Arthur  Hoss M.D.   On: 05/12/2015 07:51   Ir Fluoro Guide Cv Midline Picc Right  05/12/2015   CLINICAL DATA:  Amylase  EXAM: RIGHT UPPER EXTREMITY PICC LINE PLACEMENT WITH ULTRASOUND AND FLUOROSCOPIC GUIDANCE. ULTRASOUND GUIDANCE FOR CORE FAT PAD BIOPSY.  FLUOROSCOPY TIME:  42 seconds  PROCEDURE: The patient was advised of the possible risks and complications and agreed to undergo the procedure.  The patient was then brought to the angiographic suite for the procedure.  The right arm was prepped with chlorhexidine, draped in the usual sterile fashion using maximum barrier technique (cap and mask, sterile gown, sterile gloves, large sterile sheet, hand hygiene and cutaneous antisepsis) and infiltrated locally with 1% Lidocaine.  Ultrasound demonstrated patency of the right basilic vein, and this was documented with an image. Under real-time ultrasound guidance, this vein was accessed with a 21 gauge micropuncture needle and image documentation was performed. A 0.018 wire was introduced in to the vein. Over this, a 5 French double lumen power PICC was advanced to the lower SVC/right atrial junction. Fluoroscopy during the procedure and fluoro spot radiograph confirms appropriate catheter position. The catheter was flushed and covered with a sterile dressing.  The right lower quadrant was prepped and draped in a sterile fashion. 1% lidocaine was utilized for local anesthesia. Under sonographic guidance, 5 18 gauge core biopsies of the subcutaneous fat were obtained.  COMPLICATIONS: None  LENGTH: 41 cm  IMPRESSION: Successful right arm power PICC line placement with ultrasound and fluoroscopic guidance. The catheter is ready for use.  Successful fat pad core biopsy.   Electronically Signed   By: Arthur  Hoss M.D.   On: 05/12/2015 07:51    PHYSICAL EXAM CVP 12   General: Cachetic frail appearing + temporal wating. NAD Sitting on the side of the bed.  Neck: JVP to jaw. No thyromegaly or thyroid nodule.  Lungs: Clear to auscultation bilaterally with normal respiratory effort. CV: Nondisplaced PMI.  Heart  Tachy regular S1/S2, palpable s3, no murmur.  1-2+ edema to knees. Abdomen: Soft, nontender, + liver edge down no distention.  Neurologic: Alert and oriented x 3.  Psych: Normal affect. Extremities: No clubbing or cyanosis.   TELEMETRY: Reviewed telemetry pt in NSR 100s  ASSESSMENT AND PLAN: 69 yo  with history of HIV and multiple myeloma. He has a dual chamber pacemaker ([employer]) for remote syncope. He was admitted in 2/16 with chest pain, ?myopericarditis with mild troponin rise. LHC showed no significant coronary disease.Echo this admission showed EF 30% with diffuse hypokinesis (worse than prior). 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 IVC was very dilated. He has been persistently hypotensive.   1. Acute systolic CHF: EF about 30% by echo. LHC this admit ok. Concerned that he may have co-existing amyloidosis with cardiac involvement given the appearance of his myocardium and his clinical presentation with CHF, nonischemic cardiomyopathy. Fat pad biopsy done 6/3 was negative.   Cannot do MRI with pacemaker.  CO-OX 49%. SBP < 80.  May need to norepi,. On dobutamine   5 mcg . Dig contraindicated. CVP up to 2. Give 80 mg IV lasix now.  Renal function stable.   2. HIV: CD4 count > 200.  Seen by pulmonary.  On meds.  3. Multiple myeloma: Has been treated per heme/onc, Dr Shadad. He has been offVelcade due to side effects.  He stopped pomalidomide a few weeks ago.   4. Elevated troponin: Suspect demand ischemia from acute CHF. LHC in 2/16 without significant coronary disease.  5. Severe debility- PT following.  6. Severe protein calorie malnutrition   Palliative Care appreciated. Now DNR.   CLEGG,AMY NP_C  05/13/2015  7:58 AM   Patient seen and examined with Amy Clegg, NP. We discussed all aspects of the encounter. I agree with the assessment and plan as stated above.   He is failing inotrope support. Continues with Class IV symptoms. BP very low. I told him that I don't think we have many options and I suggested stopping dobutamine and having him go to [facility]. (I also gave him the option of continuing dobutamine but going to [facility] however doubt dobutamine is doing much for him at ths point.) He would like to talk to Dr. Anwar  again and learn more about [facility]. Will await family meeting. Agree with IV lasix today. Would not use Levophed at this point.   Bensimhon, Daniel,MD 2:22 PM          

## 2015-05-14 DIAGNOSIS — I43 Cardiomyopathy in diseases classified elsewhere: Secondary | ICD-10-CM

## 2015-05-14 DIAGNOSIS — Z515 Encounter for palliative care: Secondary | ICD-10-CM | POA: Insufficient documentation

## 2015-05-14 DIAGNOSIS — E854 Organ-limited amyloidosis: Secondary | ICD-10-CM

## 2015-05-14 LAB — BASIC METABOLIC PANEL
ANION GAP: 10 (ref 5–15)
BUN: 10 mg/dL (ref 6–20)
CALCIUM: 9.6 mg/dL (ref 8.9–10.3)
CO2: 28 mmol/L (ref 22–32)
CREATININE: 1.17 mg/dL (ref 0.61–1.24)
Chloride: 96 mmol/L — ABNORMAL LOW (ref 101–111)
GFR calc non Af Amer: >60 mL/min (ref >60)
GLUCOSE: 104 mg/dL — AB (ref 65–99)
Potassium: 3.8 mmol/L (ref 3.5–5.1)
Sodium: 134 mmol/L — ABNORMAL LOW (ref 135–145)

## 2015-05-14 LAB — CARBOXYHEMOGLOBIN
Carboxyhemoglobin: 1.4 % (ref 0.5–1.5)
Methemoglobin: 1.1 % (ref 0.0–1.5)
O2 SAT: 61.4 %
Total hemoglobin: 11.4 g/dL — ABNORMAL LOW (ref 13.5–18.0)

## 2015-05-14 MED ORDER — POTASSIUM CHLORIDE CRYS ER 20 MEQ PO TBCR
40.0000 meq | EXTENDED_RELEASE_TABLET | Freq: Every day | ORAL | Status: DC
  Administered 2015-05-14 – 2015-05-16 (×3): 40 meq via ORAL
  Filled 2015-05-14 (×2): qty 2

## 2015-05-14 MED ORDER — OXYCODONE HCL 5 MG PO TABS
5.0000 mg | ORAL_TABLET | Freq: Four times a day (QID) | ORAL | Status: DC
  Administered 2015-05-14 – 2015-05-16 (×8): 5 mg via ORAL
  Filled 2015-05-14 (×8): qty 1

## 2015-05-14 MED ORDER — FUROSEMIDE 80 MG PO TABS
80.0000 mg | ORAL_TABLET | Freq: Every day | ORAL | Status: DC
  Administered 2015-05-14 – 2015-05-15 (×2): 80 mg via ORAL
  Filled 2015-05-14 (×3): qty 1

## 2015-05-14 NOTE — Progress Notes (Signed)
Daily Progress Note   Patient Name: Rick Potter       Date: 05/14/2015 DOB: 06-23-1946  Age: 69 y.o. MRN#: [id number] Attending Physician: Larey Dresser, MD Primary Care Physician: [name], MD Admit Date: 05/07/2015  Reason for Consultation/Follow-up: Establishing goals of care and Pain control  Subjective: Still having some discomfort in chest and DOE.  Oxycodone provides some relief for few hours but takes time to kick in. Moved bowels last night.  Discussed dobutamine and potential disposition options with Dr Sung Amabile.  No other acute complaints today.    Length of Stay: 7 days  Current Medications: Scheduled Meds:  . albuterol  2.5 mg Nebulization Once  . clopidogrel  75 mg Oral Daily  . darunavir-cobicistat  1 tablet Oral Q breakfast  . emtricitabine-tenofovir  1 tablet Oral Daily  . enoxaparin (LOVENOX) injection  40 mg Subcutaneous Q24H  . feeding supplement (RESOURCE BREEZE)  1 Container Oral TID BM  . furosemide  80 mg Oral Daily  . heparin lock flush  500 Units Intracatheter Q30 days  . oxyCODONE  5 mg Oral Q6H  . potassium chloride  40 mEq Oral Daily  . senna-docusate  1 tablet Oral BID  . sodium chloride  10-40 mL Intracatheter Q12H  . sodium chloride  3 mL Intravenous Q12H    Continuous Infusions: . sodium chloride 10 mL (05/14/15 0539)  . DOBUTamine 2.5 mcg/kg/min (05/14/15 0805)  . norepinephrine (LEVOPHED) Adult infusion      PRN Meds: heparin lock flush **AND** heparin lock flush, ondansetron (ZOFRAN) IV, oxyCODONE, traMADol  Palliative Performance Scale: 40%     Vital Signs: BP 99/72 mmHg  Pulse 100  Temp(Src) 98.1 F (36.7 C) (Oral)  Resp 16  Ht 5' 9"  (1.753 m)  Wt 58.06 kg (128 lb)  BMI 18.89 kg/m2  SpO2 97% SpO2: SpO2: 97 % O2 Device: O2 Device: Not Delivered O2 Flow Rate: O2 Flow Rate (L/min): 2 L/min  Intake/output summary:  Intake/Output Summary (Last 24 hours) at 05/14/15 0940 Last data filed at 05/14/15 0900  Gross  per 24 hour  Intake 730.87 ml  Output    300 ml  Net 430.87 ml  Baseline Weight: Weight: 60.8 kg (134 lb 0.6 oz) Most recent weight: Weight: 58.06 kg (128 lb)  Physical Exam: GEN: alert, NAD, mild conversational dyspnea HEENT: , sclera anicteric CV: tachy EXT: edema     Additional Data Reviewed: Recent Labs     05/13/15  0520  05/14/15  0420  NA  134*  134*  BUN  11  10  CREATININE  1.13  1.17     Problem List:  Patient Active Problem List   Diagnosis Date Noted  . Encounter for palliative care   . Cardiogenic shock   . Chronic hypotension   . Hypotension 05/08/2015  . Acute systolic CHF (congestive heart failure)   . Cardiac amyloidosis   . Dyspnea   . SOB (shortness of breath) 05/07/2015  . DOE (dyspnea on exertion) 05/07/2015  . Multiple myeloma   . Abdominal pain   . Protein-calorie malnutrition, severe 02/04/2015  . Angina pectoris   . Elevated troponin   . NSTEMI (non-ST elevated myocardial infarction) 02/03/2015  . Chest pain 02/03/2015  . HIV disease 07/01/2014  . Multiple myeloma in relapse 07/01/2014  . TIA (transient ischemic attack) 07/01/2014  . DDD (degenerative disc disease) 07/01/2014  . OA (osteoarthritis) 07/01/2014  . Pacemaker 07/01/2014  . History of colonoscopy 07/01/2014  . History of laminectomy  07/01/2014     Palliative Care Assessment & Plan  69 yo male with HIV, MM, acute on chronic systolic HF on dobutamine infusion.    Code Status:  DNR  Goals of Care:  See notes from Dr Rowe Pavy and Dr Sung Amabile.  I talked today with Rick Potter and answered some questions he had about options in front of him.  He wonders if the dobutamine is really helping him. Hopefully we have more answers to this with trial of lower dose. He talked to me about option of home, but he almost feels like there is not much point because he is so limited in what he is able to do.  I don't think he is fond of the idea of continuous IV infusion either.  He wants to  talk more with his nephew in [place] as well as his niece who lives near [place], [place].  He has several sisters and nieces/nephews locally but those 2 are the family he is closest with.  He hopes to have decision made by tomorrow. Hospice facility has been presented as an option, and I told him that that would possibly be option if not continuing dobutamine, though we would still need to get their approval. He feels like he copes with his situation well. Not fearful of dying or the dying process. Finds comfort in talking with family.  He has also lost 3 young and 3 older siblings in past.    3. Symptom Management:  Dyspnea/Pain- not using much PRN oxycodone. Finds it somewhat helpful. Will try to schedule low dose and see how this does for him   4. Prognosis: weeks to months  5. Discharge Planning: TBD   Care plan was discussed with Nilda Calamity  Thank you for allowing the Palliative Medicine Team to assist in the care of this patient.   Total Time: 30 minutes Greater than 50%  of this time was spent counseling and coordinating care related to the above assessment and plan.   Doran Clay, DO  05/14/2015, 9:40 AM  Please contact Palliative Medicine Team phone at [phone] for questions and concerns.

## 2015-05-14 NOTE — Consult Note (Addendum)
See update to this note at bottom please.  [place] Liaison: Received request from [place] for patient interest in [facility]osp[facility]. Chart reviewed and met with patient. He confirmed interest and expressed desire to return home with hospice services before transfer to [facility]. He reports he did not realize there might not be opportunity to return home when he came to the hospital and that he would like to complete some tasks at home if possible. He feels he is functionally able to return home, reports he is ambulating to the bathroom in the hospital. He described hope to return home Friday with support of family in [place] knowing his niece Melody would arrive Saturday to help "close out" his apartment.He reports Melody will be returning to her home Monday. Explained hospice services and [facility] and made him aware possibility of transfer to [facility], [place] is dependent upon [facility] MD reviewing record and room availability. He reports he feels he can be at home with assistance from local family and hospice until room becomes available. He agreed to follow up in the morning to continue to explore plans. He reports he and MD plan to have conversation this evening with his nephew Aaron Edelman who is his 89. Made CSW and RNCM aware of this plan. Thank you. Erling Conte LCSW [phone]  Spoke with Dr. Deitra Mayo by phone this am 05/15/15 to confirm he plans to followup with patient this morning for continued goals of care. [facility] will follow up with [facility] and CSW after Dr. Deitra Mayo meets with patient. Thank you. Erling Conte LCSW [phone]

## 2015-05-14 NOTE — Progress Notes (Signed)
Patient ID: Rick Potter, male   DOB: 01-24-1946, 69 y.o.   MRN: [id number]   SUBJECTIVE:  6/3 had abdominal fat pad biopsy and PICC placement.  Yesterday given 80 mg IV lasix. CVP today down to 3.   CO-OX 61%  with dobutamine gtt 5 mcg/kg/min.       Complains of fatigue. Mild dyspnea     Scheduled Meds: . albuterol  2.5 mg Nebulization Once  . clopidogrel  75 mg Oral Daily  . darunavir-cobicistat  1 tablet Oral Q breakfast  . emtricitabine-tenofovir  1 tablet Oral Daily  . enoxaparin (LOVENOX) injection  40 mg Subcutaneous Q24H  . feeding supplement (RESOURCE BREEZE)  1 Container Oral TID BM  . heparin lock flush  500 Units Intracatheter Q30 days  . potassium chloride  40 mEq Oral BID  . senna-docusate  1 tablet Oral BID  . sodium chloride  10-40 mL Intracatheter Q12H  . sodium chloride  3 mL Intravenous Q12H   Continuous Infusions: . sodium chloride 10 mL (05/14/15 0539)  . DOBUTamine 5 mcg/kg/min (05/13/15 2340)  . norepinephrine (LEVOPHED) Adult infusion     PRN Meds:.heparin lock flush **AND** heparin lock flush, ondansetron (ZOFRAN) IV, oxyCODONE, sodium chloride, traMADol    Filed Vitals:   05/14/15 0000 05/14/15 0200 05/14/15 0329 05/14/15 0400  BP: 94/72 86/64  84/60  Pulse:      Temp:   98.5 F (36.9 C)   TempSrc:   Oral   Resp: _0 Height:      Weight:      SpO2:        Intake/Output Summary (Last 24 hours) at 05/14/15 0747 Last data filed at 05/14/15 0600  Gross per 24 hour  Intake 503.67 ml  Output    300 ml  Net 203.67 ml    LABS: Basic Metabolic Panel:  Recent Labs  05/13/15 0520 05/14/15 0420  NA 134* 134*  K 3.7 3.8  CL 96* 96*  CO2 29 28  GLUCOSE 123* 104*  BUN 11 10  CREATININE 1.13 1.17  CALCIUM 9.7 9.6   Liver Function Tests: No results for input(s): AST, ALT, ALKPHOS, BILITOT, PROT, ALBUMIN in the last 72 hours. No results for input(s): LIPASE, AMYLASE in the last 72 hours. CBC: No results for input(s): WBC,  NEUTROABS, HGB, HCT, MCV, PLT in the last 72 hours. Cardiac Enzymes: No results for input(s): CKTOTAL, CKMB, CKMBINDEX, TROPONINI in the last 72 hours. BNP: Invalid input(s): POCBNP D-Dimer: No results for input(s): DDIMER in the last 72 hours. Hemoglobin A1C: No results for input(s): HGBA1C in the last 72 hours. Fasting Lipid Panel: No results for input(s): CHOL, HDL, LDLCALC, TRIG, CHOLHDL, LDLDIRECT in the last 72 hours. Thyroid Function Tests: No results for input(s): TSH, T4TOTAL, T3FREE, THYROIDAB in the last 72 hours.  Invalid input(s): FREET3 Anemia Panel: No results for input(s): VITAMINB12, FOLATE, FERRITIN, TIBC, IRON, RETICCTPCT in the last 72 hours.  RADIOLOGY: Dg Chest 2 View  05/07/2015   CLINICAL DATA:  Shortness of breath for 3 days.  EXAM: CHEST  2 VIEW  COMPARISON:  04/14/2015.  CT 02/03/2015  FINDINGS: Tip of the right chest port remains in the SVC. Unchanged dual lead left-sided pacemaker. There is unchanged elevation of right hemidiaphragm. Minimal linear atelectasis at the right lung base. Cardiomediastinal contours are unchanged. No confluent consolidation, large pleural effusion or pneumothorax. Postsurgical change in the lower cervical spine palm partially included. No acute osseous abnormality. The lytic lesions on prior  CT are not appreciated radiographically.  IMPRESSION: No acute pulmonary process.   Electronically Signed   By: Jeb Levering M.D.   On: 05/07/2015 01:16   Dg Chest 2 View  04/14/2015   CLINICAL DATA:  Chronic chest pain, with worsening dyspnea and pain over the past month.  EXAM: CHEST  2 VIEW  COMPARISON:  02/03/2015  FINDINGS: There is a right jugular Port-A-Cath with tip in the low SVC. There are intact appearances of the transvenous pacing leads.  There is unchanged moderate right hemidiaphragm elevation. The lungs are clear. The pulmonary vasculature is normal. There are no pleural effusions. Hilar, mediastinal and cardiac contours are  unremarkable and unchanged.  IMPRESSION: No active cardiopulmonary disease.   Electronically Signed   By: Andreas Newport M.D.   On: 04/14/2015 19:53   Ir US Guide Vasc Access Right  05/12/2015   CLINICAL DATA:  Amylase  EXAM: RIGHT UPPER EXTREMITY PICC LINE PLACEMENT WITH ULTRASOUND AND FLUOROSCOPIC GUIDANCE. ULTRASOUND GUIDANCE FOR CORE FAT PAD BIOPSY.  FLUOROSCOPY TIME:  42 seconds  PROCEDURE: The patient was advised of the possible risks and complications and agreed to undergo the procedure. The patient was then brought to the angiographic suite for the procedure.  The right arm was prepped with chlorhexidine, draped in the usual sterile fashion using maximum barrier technique (cap and mask, sterile gown, sterile gloves, large sterile sheet, hand hygiene and cutaneous antisepsis) and infiltrated locally with 1% Lidocaine.  Ultrasound demonstrated patency of the right basilic vein, and this was documented with an image. Under real-time ultrasound guidance, this vein was accessed with a 21 gauge micropuncture needle and image documentation was performed. A 0.018 wire was introduced in to the vein. Over this, a 5 [place] double lumen power PICC was advanced to the lower SVC/right atrial junction. Fluoroscopy during the procedure and fluoro spot radiograph confirms appropriate catheter position. The catheter was flushed and covered with a sterile dressing.  The right lower quadrant was prepped and draped in a sterile fashion. 1% lidocaine was utilized for local anesthesia. Under sonographic guidance, 5 18 gauge core biopsies of the subcutaneous fat were obtained.  COMPLICATIONS: None  LENGTH: 41 cm  IMPRESSION: Successful right arm power PICC line placement with ultrasound and fluoroscopic guidance. The catheter is ready for use.  Successful fat pad core biopsy.   Electronically Signed   By: Marybelle Killings M.D.   On: 05/12/2015 07:51   Ir US Guide Bx Asp/drain  05/12/2015   CLINICAL DATA:  Amylase  EXAM: RIGHT  UPPER EXTREMITY PICC LINE PLACEMENT WITH ULTRASOUND AND FLUOROSCOPIC GUIDANCE. ULTRASOUND GUIDANCE FOR CORE FAT PAD BIOPSY.  FLUOROSCOPY TIME:  42 seconds  PROCEDURE: The patient was advised of the possible risks and complications and agreed to undergo the procedure. The patient was then brought to the angiographic suite for the procedure.  The right arm was prepped with chlorhexidine, draped in the usual sterile fashion using maximum barrier technique (cap and mask, sterile gown, sterile gloves, large sterile sheet, hand hygiene and cutaneous antisepsis) and infiltrated locally with 1% Lidocaine.  Ultrasound demonstrated patency of the right basilic vein, and this was documented with an image. Under real-time ultrasound guidance, this vein was accessed with a 21 gauge micropuncture needle and image documentation was performed. A 0.018 wire was introduced in to the vein. Over this, a 5 [place] double lumen power PICC was advanced to the lower SVC/right atrial junction. Fluoroscopy during the procedure and fluoro spot radiograph confirms appropriate catheter position.  The catheter was flushed and covered with a sterile dressing.  The right lower quadrant was prepped and draped in a sterile fashion. 1% lidocaine was utilized for local anesthesia. Under sonographic guidance, 5 18 gauge core biopsies of the subcutaneous fat were obtained.  COMPLICATIONS: None  LENGTH: 41 cm  IMPRESSION: Successful right arm power PICC line placement with ultrasound and fluoroscopic guidance. The catheter is ready for use.  Successful fat pad core biopsy.   Electronically Signed   By: Marybelle Killings M.D.   On: 05/12/2015 07:51   Ir Fluoro Guide Cv Midline Picc Right  05/12/2015   CLINICAL DATA:  Amylase  EXAM: RIGHT UPPER EXTREMITY PICC LINE PLACEMENT WITH ULTRASOUND AND FLUOROSCOPIC GUIDANCE. ULTRASOUND GUIDANCE FOR CORE FAT PAD BIOPSY.  FLUOROSCOPY TIME:  42 seconds  PROCEDURE: The patient was advised of the possible risks and  complications and agreed to undergo the procedure. The patient was then brought to the angiographic suite for the procedure.  The right arm was prepped with chlorhexidine, draped in the usual sterile fashion using maximum barrier technique (cap and mask, sterile gown, sterile gloves, large sterile sheet, hand hygiene and cutaneous antisepsis) and infiltrated locally with 1% Lidocaine.  Ultrasound demonstrated patency of the right basilic vein, and this was documented with an image. Under real-time ultrasound guidance, this vein was accessed with a 21 gauge micropuncture needle and image documentation was performed. A 0.018 wire was introduced in to the vein. Over this, a 5 [place] double lumen power PICC was advanced to the lower SVC/right atrial junction. Fluoroscopy during the procedure and fluoro spot radiograph confirms appropriate catheter position. The catheter was flushed and covered with a sterile dressing.  The right lower quadrant was prepped and draped in a sterile fashion. 1% lidocaine was utilized for local anesthesia. Under sonographic guidance, 5 18 gauge core biopsies of the subcutaneous fat were obtained.  COMPLICATIONS: None  LENGTH: 41 cm  IMPRESSION: Successful right arm power PICC line placement with ultrasound and fluoroscopic guidance. The catheter is ready for use.  Successful fat pad core biopsy.   Electronically Signed   By: Marybelle Killings M.D.   On: 05/12/2015 07:51    PHYSICAL EXAM CVP 3  General: Cachetic frail appearing + temporal wating. NAD Sitting on the side of the bed.  Neck: JVP to jaw. No thyromegaly or thyroid nodule.  Lungs: Clear to auscultation bilaterally with normal respiratory effort. CV: Nondisplaced PMI.  Heart  Tachy regular S1/S2, palpable s3, no murmur.  1-2+ edema to knees. Abdomen: Soft, nontender, + liver edge down no distention.  Neurologic: Alert and oriented x 3.  Psych: Normal affect. Extremities: No clubbing or cyanosis.   TELEMETRY: Reviewed  telemetry pt in NSR 100s  ASSESSMENT AND PLAN: 69 yo with history of HIV and multiple myeloma. 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 He was admitted in 2/16 with chest pain, ?myopericarditis with mild troponin rise. LHC showed no significant coronary disease.Echo this admission showed EF 30% with diffuse hypokinesis (worse than prior). 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 IVC was very dilated. He has been persistently hypotensive.   1. Acute systolic CHF: EF about 52% by echo. LHC this admit ok. Concerned that he may have co-existing amyloidosis with cardiac involvement given the appearance of his myocardium and his clinical presentation with CHF, nonischemic cardiomyopathy. Fat pad biopsy done 6/3 was negative.   Cannot do MRI with pacemaker.  CO-OX 61%. SBP remains soft.  On dobutamine 5 mcg . Dig contraindicated. CVP down 3.   Renal function stable.   2. HIV: CD4 count > 200.  Seen by pulmonary.  On meds.  3. Multiple myeloma: Has been treated per heme/onc, Dr Alen Blew. He has been offVelcade due to side effects.  He stopped pomalidomide a few weeks ago.   4. Elevated troponin: Suspect demand ischemia from acute CHF. LHC in 2/16 without significant coronary disease.  5. Severe debility- PT following.  6. Severe protein calorie malnutrition 7. DNR.    Palliative Care appreciated. Now DNR. May need [employer]. Marland Kitchen   CLEGG,AMY NP-C  05/14/2015  7:47 AM  Patient seen and examined with Darrick Grinder, NP. We discussed all aspects of the encounter. I agree with the assessment and plan as stated above.   Slightly better today but still weak. He is nearing the point for discharge. I told him we will need to decide on 2 things today 1) Where does he want to go after d/c: home or family member's house, SNF (if has dobutamine will need [facility]) or [employer] (if eligible)? 2) Dobutamine or no dobutamine?  I asked if he  wanted his family to be part of the decision and he said the only person who really is involved is his nephew in [place] and I offered to call him later today - which he agreed to. He also wants to discuss with Dr. Rowe Pavy.   For today we will decrease dobutamine to 2.5 and see if e can tell a difference. If not we will continue to wean and see if we can stop. Will resume po diuretics.   Hopefully we can have a dispo plan in place by tomorrow and discharge by Friday.   Annabel Gibeau,MD 8:04 AM

## 2015-05-15 DIAGNOSIS — R630 Anorexia: Secondary | ICD-10-CM

## 2015-05-15 LAB — BASIC METABOLIC PANEL
ANION GAP: 9 (ref 5–15)
BUN: 12 mg/dL (ref 6–20)
CO2: 28 mmol/L (ref 22–32)
Calcium: 9.5 mg/dL (ref 8.9–10.3)
Chloride: 96 mmol/L — ABNORMAL LOW (ref 101–111)
Creatinine, Ser: 1.21 mg/dL (ref 0.61–1.24)
GFR calc Af Amer: >60 mL/min (ref >60)
GFR calc non Af Amer: >60 mL/min (ref >60)
Glucose, Bld: 100 mg/dL — ABNORMAL HIGH (ref 65–99)
POTASSIUM: 3.8 mmol/L (ref 3.5–5.1)
SODIUM: 133 mmol/L — AB (ref 135–145)

## 2015-05-15 LAB — CARBOXYHEMOGLOBIN
CARBOXYHEMOGLOBIN: 0.7 % (ref 0.5–1.5)
Carboxyhemoglobin: 1 % (ref 0.5–1.5)
Methemoglobin: 0.9 % (ref 0.0–1.5)
Methemoglobin: 1.1 % (ref 0.0–1.5)
O2 SAT: 54 %
O2 Saturation: 45.7 %
Total hemoglobin: 11.2 g/dL — ABNORMAL LOW (ref 13.5–18.0)
Total hemoglobin: 11.8 g/dL — ABNORMAL LOW (ref 13.5–18.0)

## 2015-05-15 MED ORDER — DRONABINOL 2.5 MG PO CAPS
2.5000 mg | ORAL_CAPSULE | Freq: Two times a day (BID) | ORAL | Status: DC
  Administered 2015-05-15 (×2): 2.5 mg via ORAL
  Filled 2015-05-15 (×2): qty 1

## 2015-05-15 NOTE — Progress Notes (Signed)
CSW received consult that patient might need residential hospice placement and that family/patient had requested [facility].  CSW contacted [facility] who was already aware of the patient.  Plan if for patient to return home for a few days to be with family and then transition to [employer] liaison is working with family to coordinate this plan.  CSW signing off- please re consult if needed.  Domenica Reamer, [place] Social Worker [phone]

## 2015-05-15 NOTE — Progress Notes (Signed)
PT Cancellation/Discharge Note  Patient Details Name: Rick Potter MRN: [id number] DOB: 1946/05/04   Cancelled Treatment:    Reason Eval/Treat Not Completed: Medical issues which prohibited therapy.  Noted patient to discharge home with Hospice/Palliative Care and to [facility] thereafter.  Patient is receiving comfort care.  Will d/c physical therapy services at this time.   Despina Pole 05/15/2015, 5:34 PM Carita Pian. Sanjuana Kava, [place] Pager [phone]

## 2015-05-15 NOTE — Progress Notes (Signed)
Notifed by Jackelyn Poling [facility] of pt./family request for [facility] [place] [facility] services at home after discharge. Chart and information reviewed with Dr. Alferd Patee [facility] Medical Director and hospice eligibility confirmed.  Writer spoke with patient briefly at the bedside to initiate education related to hospice philosophy, services and team approach to care. Pt. voiced understanding of information provided. Per discussion plan is for discharge to  home tomorrow by personal vehicle.   Please send signed and completed DNR form home with patient.  Pt. will need prescriptions for discharge comfort  medications.  DME needs discussed and pt. declines needing any equipment.  [facility] aware of above. Please notify [facility] when pt. Is ready to leave at discharge- [phone].( or [phone] after 5 pm) [facility] information and contact numbers have been given to pt. during visit. Above information shared with Jackelyn Poling, [facility]. Please call with any questions.  [facility] Liaison [phone]

## 2015-05-15 NOTE — Progress Notes (Signed)
  To SW and Case management. Please note that patient feels worse off dobutamine and plan is for transfer directly to [facility] if approved and bed available.  Bensimhon, Daniel,MD 6:15 PM

## 2015-05-15 NOTE — Progress Notes (Signed)
1130 Noted that pt is being seen by PT and that plans are to go home for few days and then [place]. Do not feel that pt is appropriate for our services and best served by PT.  PT to see today. Will are signing off. Graylon Good RN BSN 05/15/2015 11:28 AM

## 2015-05-15 NOTE — Progress Notes (Addendum)
Patient ID: Rick Potter, male   DOB: 18-Nov-1946, 69 y.o.   MRN: [id number]   SUBJECTIVE:  6/3 had abdominal fat pad biopsy and PICC placement. Yesterday given 80 mg oral lasix. CVP 12.   CO-OX 46%  with dobutamine gtt 2.5 mcg/kg/min.   D/C'd dobutamine and will check co-ox this afternoon.  He wants to go home without it.    Complains of fatigue. Mild dyspnea.  Looks tired, seated on edge of bed on my arrival.  Asks if we can tell him how many days he has left.   Became short of breath shifting in bed to check CVP.    Scheduled Meds: . albuterol  2.5 mg Nebulization Once  . clopidogrel  75 mg Oral Daily  . darunavir-cobicistat  1 tablet Oral Q breakfast  . emtricitabine-tenofovir  1 tablet Oral Daily  . enoxaparin (LOVENOX) injection  40 mg Subcutaneous Q24H  . feeding supplement (RESOURCE BREEZE)  1 Container Oral TID BM  . furosemide  80 mg Oral Daily  . heparin lock flush  500 Units Intracatheter Q30 days  . oxyCODONE  5 mg Oral Q6H  . potassium chloride  40 mEq Oral Daily  . senna-docusate  1 tablet Oral BID  . sodium chloride  10-40 mL Intracatheter Q12H  . sodium chloride  3 mL Intravenous Q12H   Continuous Infusions: . sodium chloride 8 mL/hr at 05/14/15 2000  . DOBUTamine 2.5 mcg/kg/min (05/14/15 2000)  . norepinephrine (LEVOPHED) Adult infusion     PRN Meds:.heparin lock flush **AND** heparin lock flush, ondansetron (ZOFRAN) IV, oxyCODONE, traMADol    Filed Vitals:   05/15/15 0400 05/15/15 0500 05/15/15 0600 05/15/15 0700  BP: 85/55  84/61   Pulse:      Temp:      TempSrc:      Resp: 14 25 16 13   Height:      Weight:      SpO2: 97%       Intake/Output Summary (Last 24 hours) at 05/15/15 0754 Last data filed at 05/15/15 0700  Gross per 24 hour  Intake   1063 ml  Output    200 ml  Net    863 ml    LABS: Basic Metabolic Panel:  Recent Labs  05/14/15 0420 05/15/15 0430  NA 134* 133*  K 3.8 3.8  CL 96* 96*  CO2 28 28  GLUCOSE 104* 100*  BUN 10  12  CREATININE 1.17 1.21  CALCIUM 9.6 9.5   Liver Function Tests: No results for input(s): AST, ALT, ALKPHOS, BILITOT, PROT, ALBUMIN in the last 72 hours. No results for input(s): LIPASE, AMYLASE in the last 72 hours. CBC: No results for input(s): WBC, NEUTROABS, HGB, HCT, MCV, PLT in the last 72 hours. Cardiac Enzymes: No results for input(s): CKTOTAL, CKMB, CKMBINDEX, TROPONINI in the last 72 hours. BNP: Invalid input(s): POCBNP D-Dimer: No results for input(s): DDIMER in the last 72 hours. Hemoglobin A1C: No results for input(s): HGBA1C in the last 72 hours. Fasting Lipid Panel: No results for input(s): CHOL, HDL, LDLCALC, TRIG, CHOLHDL, LDLDIRECT in the last 72 hours. Thyroid Function Tests: No results for input(s): TSH, T4TOTAL, T3FREE, THYROIDAB in the last 72 hours.  Invalid input(s): FREET3 Anemia Panel: No results for input(s): VITAMINB12, FOLATE, FERRITIN, TIBC, IRON, RETICCTPCT in the last 72 hours.  RADIOLOGY: Dg Chest 2 View  05/07/2015   CLINICAL DATA:  Shortness of breath for 3 days.  EXAM: CHEST  2 VIEW  COMPARISON:  04/14/2015.  CT 02/03/2015  FINDINGS: Tip of the right chest port remains in the SVC. Unchanged dual lead left-sided pacemaker. There is unchanged elevation of right hemidiaphragm. Minimal linear atelectasis at the right lung base. Cardiomediastinal contours are unchanged. No confluent consolidation, large pleural effusion or pneumothorax. Postsurgical change in the lower cervical spine palm partially included. No acute osseous abnormality. The lytic lesions on prior CT are not appreciated radiographically.  IMPRESSION: No acute pulmonary process.   Electronically Signed   By: Jeb Levering M.D.   On: 05/07/2015 01:16   Ir US Guide Vasc Access Right  05/12/2015   CLINICAL DATA:  Amylase  EXAM: RIGHT UPPER EXTREMITY PICC LINE PLACEMENT WITH ULTRASOUND AND FLUOROSCOPIC GUIDANCE. ULTRASOUND GUIDANCE FOR CORE FAT PAD BIOPSY.  FLUOROSCOPY TIME:  42 seconds   PROCEDURE: The patient was advised of the possible risks and complications and agreed to undergo the procedure. The patient was then brought to the angiographic suite for the procedure.  The right arm was prepped with chlorhexidine, draped in the usual sterile fashion using maximum barrier technique (cap and mask, sterile gown, sterile gloves, large sterile sheet, hand hygiene and cutaneous antisepsis) and infiltrated locally with 1% Lidocaine.  Ultrasound demonstrated patency of the right basilic vein, and this was documented with an image. Under real-time ultrasound guidance, this vein was accessed with a 21 gauge micropuncture needle and image documentation was performed. A 0.018 wire was introduced in to the vein. Over this, a 5 [place] double lumen power PICC was advanced to the lower SVC/right atrial junction. Fluoroscopy during the procedure and fluoro spot radiograph confirms appropriate catheter position. The catheter was flushed and covered with a sterile dressing.  The right lower quadrant was prepped and draped in a sterile fashion. 1% lidocaine was utilized for local anesthesia. Under sonographic guidance, 5 18 gauge core biopsies of the subcutaneous fat were obtained.  COMPLICATIONS: None  LENGTH: 41 cm  IMPRESSION: Successful right arm power PICC line placement with ultrasound and fluoroscopic guidance. The catheter is ready for use.  Successful fat pad core biopsy.   Electronically Signed   By: Marybelle Killings M.D.   On: 05/12/2015 07:51   Ir US Guide Bx Asp/drain  05/12/2015   CLINICAL DATA:  Amylase  EXAM: RIGHT UPPER EXTREMITY PICC LINE PLACEMENT WITH ULTRASOUND AND FLUOROSCOPIC GUIDANCE. ULTRASOUND GUIDANCE FOR CORE FAT PAD BIOPSY.  FLUOROSCOPY TIME:  42 seconds  PROCEDURE: The patient was advised of the possible risks and complications and agreed to undergo the procedure. The patient was then brought to the angiographic suite for the procedure.  The right arm was prepped with chlorhexidine, draped  in the usual sterile fashion using maximum barrier technique (cap and mask, sterile gown, sterile gloves, large sterile sheet, hand hygiene and cutaneous antisepsis) and infiltrated locally with 1% Lidocaine.  Ultrasound demonstrated patency of the right basilic vein, and this was documented with an image. Under real-time ultrasound guidance, this vein was accessed with a 21 gauge micropuncture needle and image documentation was performed. A 0.018 wire was introduced in to the vein. Over this, a 5 [place] double lumen power PICC was advanced to the lower SVC/right atrial junction. Fluoroscopy during the procedure and fluoro spot radiograph confirms appropriate catheter position. The catheter was flushed and covered with a sterile dressing.  The right lower quadrant was prepped and draped in a sterile fashion. 1% lidocaine was utilized for local anesthesia. Under sonographic guidance, 5 18 gauge core biopsies of the subcutaneous fat were obtained.  COMPLICATIONS: None  LENGTH:  41 cm  IMPRESSION: Successful right arm power PICC line placement with ultrasound and fluoroscopic guidance. The catheter is ready for use.  Successful fat pad core biopsy.   Electronically Signed   By: Marybelle Killings M.D.   On: 05/12/2015 07:51   Ir Fluoro Guide Cv Midline Picc Right  05/12/2015   CLINICAL DATA:  Amylase  EXAM: RIGHT UPPER EXTREMITY PICC LINE PLACEMENT WITH ULTRASOUND AND FLUOROSCOPIC GUIDANCE. ULTRASOUND GUIDANCE FOR CORE FAT PAD BIOPSY.  FLUOROSCOPY TIME:  42 seconds  PROCEDURE: The patient was advised of the possible risks and complications and agreed to undergo the procedure. The patient was then brought to the angiographic suite for the procedure.  The right arm was prepped with chlorhexidine, draped in the usual sterile fashion using maximum barrier technique (cap and mask, sterile gown, sterile gloves, large sterile sheet, hand hygiene and cutaneous antisepsis) and infiltrated locally with 1% Lidocaine.  Ultrasound  demonstrated patency of the right basilic vein, and this was documented with an image. Under real-time ultrasound guidance, this vein was accessed with a 21 gauge micropuncture needle and image documentation was performed. A 0.018 wire was introduced in to the vein. Over this, a 5 [place] double lumen power PICC was advanced to the lower SVC/right atrial junction. Fluoroscopy during the procedure and fluoro spot radiograph confirms appropriate catheter position. The catheter was flushed and covered with a sterile dressing.  The right lower quadrant was prepped and draped in a sterile fashion. 1% lidocaine was utilized for local anesthesia. Under sonographic guidance, 5 18 gauge core biopsies of the subcutaneous fat were obtained.  COMPLICATIONS: None  LENGTH: 41 cm  IMPRESSION: Successful right arm power PICC line placement with ultrasound and fluoroscopic guidance. The catheter is ready for use.  Successful fat pad core biopsy.   Electronically Signed   By: Marybelle Killings M.D.   On: 05/12/2015 07:51    PHYSICAL EXAM CVP 3  General: Cachetic frail appearing + temporal wating. NAD Sitting on the side of the bed.  Neck: JVP to jaw. No thyromegaly or thyroid nodule.  Lungs: Clear to auscultation bilaterally with normal respiratory effort. CV: Nondisplaced PMI.  Tachy regular S1/S2, palpable s3, no murmur.  0-1+ edema to knees. Abdomen: Soft, nontender, + liver edge down no distention.  Neurologic: Alert and oriented x 3.  Psych: Normal affect. Extremities: No clubbing or cyanosis.   TELEMETRY: Reviewed telemetry pt in NSR 100s  ASSESSMENT AND PLAN: 69 yo with history of HIV and multiple myeloma. 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 He was admitted in 2/16 with chest pain, ?myopericarditis with mild troponin rise. LHC showed no significant coronary disease.Echo this admission showed EF 30% with diffuse hypokinesis (worse than prior). 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 IVC was very dilated. He has been persistently hypotensive.   1. Acute systolic CHF: EF about 28% by echo. LHC this admit ok. Concerned that he may have co-existing amyloidosis with cardiac involvement given the appearance of his myocardium and his clinical presentation with CHF, nonischemic cardiomyopathy. Fat pad biopsy done 6/3 was negative.   Co-ox falling with decreased dobutamine drip.  2. HIV: CD4 count > 200.  Seen by pulmonary.  On meds.  3. Multiple myeloma: End-stage Has been treated per heme/onc, Dr Alen Blew. He has been offVelcade due to side effects.  He stopped pomalidomide a few weeks ago.   4. Elevated troponin: Suspect demand ischemia from acute CHF. LHC in 2/16  without significant coronary disease.  5. Severe debility- PT following.  6. Severe protein calorie malnutrition 7. DNR.   Back on oral diuretics and feels about the same on decreased dobutamine, with decreased co-ox. Cr stable.   Palliative Care appreciated. Now DNR. Per CSW, pt would like to go home with hospice services and no inotropes to get his affairs in order, prior to his transfer to [facility].  Will d/c dobutamine and recheck co-ox this afternoon to see how he does. Case management to see this morning to get pt set up with Palliative care of [facility].  Dispo: Discharge to home with hospice vs [facility] place by Friday   Rick Hollywood, PA-C 05/15/2015 7:56 AM    Patient seen and examined with Rick Kilts, PA-C. We discussed all aspects of the encounter. I agree with the assessment and plan as stated above.   Co-ox down significantly with decreased dobutamine. His initial desire is to be discharged home followed by [facility]. However, I am worried that once we stop dobutamine completely he may not be able to support himself at home even for a few days. He agrees and wants to go to [facility] directly from here. i will communicate with Dr. Deitra Mayo.  Dobutamine now off.   Rick Ging,MD 12:25 PM

## 2015-05-15 NOTE — Progress Notes (Signed)
Daily Progress Note   Patient Name: Rick Potter       Date: 05/15/2015 DOB: 1945-12-28  Age: 69 y.o. MRN#: [id number] Attending Physician: Larey Dresser, MD Primary Care Physician: [name], MD Admit Date: 05/07/2015  Reason for Consultation/Follow-up: Establishing goals of care, Non pain symptom management and Pain control  Subjective: Pain better controlled with scheduled oxycodone.  Appetite poor. Met with [facility] liaison.    Length of Stay: 8 days  Current Medications: Scheduled Meds:  . albuterol  2.5 mg Nebulization Once  . clopidogrel  75 mg Oral Daily  . dronabinol  2.5 mg Oral BID AC  . enoxaparin (LOVENOX) injection  40 mg Subcutaneous Q24H  . feeding supplement (RESOURCE BREEZE)  1 Container Oral TID BM  . furosemide  80 mg Oral Daily  . heparin lock flush  500 Units Intracatheter Q30 days  . oxyCODONE  5 mg Oral Q6H  . potassium chloride  40 mEq Oral Daily  . senna-docusate  1 tablet Oral BID  . sodium chloride  10-40 mL Intracatheter Q12H  . sodium chloride  3 mL Intravenous Q12H    Continuous Infusions: . sodium chloride 8 mL/hr at 05/14/15 2000    PRN Meds: heparin lock flush **AND** heparin lock flush, ondansetron (ZOFRAN) IV, oxyCODONE    Vital Signs: BP 78/60 mmHg  Pulse 105  Temp(Src) 97.9 F (36.6 C) (Oral)  Resp 16  Ht _0  (1.753 m)  Wt 58.06 kg (128 lb)  BMI 18.89 kg/m2  SpO2 98% SpO2: SpO2: 98 % O2 Device: O2 Device: Not Delivered O2 Flow Rate: O2 Flow Rate (L/min): 2 L/min  Intake/output summary:  Intake/Output Summary (Last 24 hours) at 05/15/15 0907 Last data filed at 05/15/15 0800  Gross per 24 hour  Intake  584.9 ml  Output    200 ml  Net  384.9 ml   Baseline Weight: Weight: 60.8 kg (134 lb 0.6 oz) Most recent weight: Weight: 58.06 kg (128 lb)  Physical Exam: GEN: alert, NAD HEENT: [place], sclera anicteric CV: tachy       Additional Data Reviewed: Recent Labs     05/14/15  0420  05/15/15  0430  NA   134*  133*  BUN  10  12  CREATININE  1.17  1.21     Problem List:  Patient Active Problem List   Diagnosis Date Noted  . Palliative care encounter   . Encounter for palliative care   . Cardiogenic shock   . Chronic hypotension   . Hypotension 05/08/2015  . Acute systolic CHF (congestive heart failure)   . Cardiac amyloidosis   . Dyspnea   . SOB (shortness of breath) 05/07/2015  . DOE (dyspnea on exertion) 05/07/2015  . Multiple myeloma   . Abdominal pain   . Protein-calorie malnutrition, severe 02/04/2015  . Angina pectoris   . Elevated troponin   . NSTEMI (non-ST elevated myocardial infarction) 02/03/2015  . Chest pain 02/03/2015  . HIV disease 07/01/2014  . Multiple myeloma in relapse 07/01/2014  . TIA (transient ischemic attack) 07/01/2014  . DDD (degenerative disc disease) 07/01/2014  . OA (osteoarthritis) 07/01/2014  . Pacemaker 07/01/2014  . History of colonoscopy 07/01/2014  . History of laminectomy 07/01/2014     Palliative Care Assessment & Plan  69 yo male with HIV, MM, acute on chronic systolic HF on dobutamine infusion.   Code Status:  DNR  Goals of Care: Met with Rick Potter this morning. He had chance to talk with  people from [facility] and desires to go home for a few days and then transfer to [facility] from home to focus on comfort.  I think this is possible with help from his family and hospice care at home. He seems to have tolerated wean in dobutamine but his pressors do remain quite low.  Tentative plan to d/c tomorrow. Would be good to make sure he did okay off dobutamine overnight.  I will place CM consult for home hospice care.  Discussed stopping HIV meds as they are unlikely to have impact on his prognosis, he agrees and I have stopped.    3. Symptom Management:  Dyspnea/Pain-better with scheduled and PRN oxycodone  Loss of appetite- trial of marinol  4. Prognosis: weeks if disease follows expected course  5. Discharge Planning:  home hospice with plan to transition to [facility] after few days.    Care plan was discussed with [place]  Thank you for allowing the Palliative Medicine Team to assist in the care of this patient. Total Time: 30 minutes Greater than 50%  of this time was spent counseling and coordinating care related to the above assessment and plan.   Doran Clay, DO  05/15/2015, 9:07 AM  Please contact Palliative Medicine Team phone at [phone] for questions and concerns.

## 2015-05-16 LAB — BASIC METABOLIC PANEL
Anion gap: 11 (ref 5–15)
BUN: 18 mg/dL (ref 6–20)
CO2: 27 mmol/L (ref 22–32)
CREATININE: 1.29 mg/dL — AB (ref 0.61–1.24)
Calcium: 9.7 mg/dL (ref 8.9–10.3)
Chloride: 96 mmol/L — ABNORMAL LOW (ref 101–111)
GFR calc Af Amer: >60 mL/min (ref >60)
GFR calc non Af Amer: 56 mL/min — ABNORMAL LOW (ref >60)
Glucose, Bld: 113 mg/dL — ABNORMAL HIGH (ref 65–99)
Potassium: 4.1 mmol/L (ref 3.5–5.1)
Sodium: 134 mmol/L — ABNORMAL LOW (ref 135–145)

## 2015-05-16 LAB — CARBOXYHEMOGLOBIN
CARBOXYHEMOGLOBIN: 1.3 % (ref 0.5–1.5)
METHEMOGLOBIN: 1.3 % (ref 0.0–1.5)
O2 Saturation: 61.1 %
TOTAL HEMOGLOBIN: 11.4 g/dL — AB (ref 13.5–18.0)

## 2015-05-16 MED ORDER — POTASSIUM CHLORIDE CRYS ER 20 MEQ PO TBCR: 40.0000 meq | EXTENDED_RELEASE_TABLET | Freq: Every day | ORAL | Status: AC

## 2015-05-16 MED ORDER — FUROSEMIDE 10 MG/ML IJ SOLN
80.0000 mg | Freq: Once | INTRAMUSCULAR | Status: AC
  Administered 2015-05-16: 80 mg via INTRAVENOUS
  Filled 2015-05-16: qty 8

## 2015-05-16 MED ORDER — DRONABINOL 2.5 MG PO CAPS: 2.5000 mg | ORAL_CAPSULE | Freq: Two times a day (BID) | ORAL | Status: AC

## 2015-05-16 MED ORDER — ONDANSETRON HCL 4 MG/2ML IJ SOLN: 4.0000 mg | Freq: Four times a day (QID) | INTRAMUSCULAR | Status: AC | PRN

## 2015-05-16 MED ORDER — SENNOSIDES-DOCUSATE SODIUM 8.6-50 MG PO TABS: 1.0000 | ORAL_TABLET | Freq: Two times a day (BID) | ORAL | Status: AC

## 2015-05-16 MED ORDER — OXYCODONE HCL 5 MG PO TABS: 5.0000 mg | ORAL_TABLET | ORAL | Status: AC | PRN

## 2015-05-16 MED ORDER — FUROSEMIDE 80 MG PO TABS: 80.0000 mg | ORAL_TABLET | Freq: Two times a day (BID) | ORAL | Status: AC

## 2015-05-16 MED ORDER — FUROSEMIDE 80 MG PO TABS
80.0000 mg | ORAL_TABLET | Freq: Two times a day (BID) | ORAL | Status: DC
  Filled 2015-05-16 (×2): qty 1

## 2015-05-16 NOTE — Progress Notes (Signed)
Pt not feeling as well today and plan w sw if for [facility] today. sw made arrangements.

## 2015-05-16 NOTE — Progress Notes (Signed)
CSW received call stating that plan is now for patient to DC directly to [facility].    Patient will discharge to [facility] Anticipated discharge date:05/16/15 Transportation by [facility]- called at 10:00am  CSW signing off.  Domenica Reamer, [place] Social Worker [phone]

## 2015-05-16 NOTE — Progress Notes (Signed)
Plan to transfer to hospice facility today. Social visit.  Rick Potter comfortable with plan.  Feels like symptoms controlled today.  Doran Clay D.O. Palliative Medicine Team at [facility]  Pager: [phone] Team Phone: [phone]

## 2015-05-16 NOTE — Progress Notes (Signed)
Patient discharged with [facility]  Loni Muse, RN

## 2015-05-16 NOTE — Progress Notes (Signed)
Nutrition Brief Note  Chart reviewed. Pt now transitioning to comfort care.  No further nutrition interventions warranted at this time.  Please re-consult as needed.   Korvin Valentine A. Zacaria Pousson, RD, LDN, CDE Pager: [phone] After hours Pager: [phone]  

## 2015-05-16 NOTE — Discharge Summary (Signed)
Advanced Heart Failure Team  Discharge Summary   Patient ID: Rick Potter MRN: [id number], DOB/AGE: [birth year] 69 y.o. Admit date: 05/07/2015 D/C date:     05/16/2015   Primary Discharge Diagnoses:  1. Acute systolic CHF: EF about 29% by echo. LHC this admit ok. Concerned for amyloidosis. Fat pad biopsy done 6/3 was negative. Co-ox stable currently off dobutamine. 2. HIV: CD4 count > 200. Seen by pulmonary. On meds.  3. Multiple myeloma: End-stage Has been treated per heme/onc, Dr Alen Blew. He has been offVelcade due to side effects. He stopped pomalidomide a few weeks ago.  4. Elevated troponin: Suspect demand ischemia from acute CHF. LHC in 2/16 without significant coronary disease.  5. Severe debility- PT following.  6. Severe protein calorie malnutrition 7. DNR.   Hospital Course: Rick Potter is a 69 y.o. male with h/o HIV, chronic chest pain, multiple myeloma in relapse who presented to [facility] with class IV HF and dull central CP. He had a heart cath in 2/16 which showed non-occlusive disease.  On admission a repeat echo was done and pt was found to be in Acute systolic CHF with EF of ~47% and volume overload.  There was a concern for cardiac amyloid but fat pad biopsy was negative, which does not exclude amyloidosis. Pt was placed on PICC and dobutamine with Co-ox in 40s.  He continued to struggle with fatigue and dyspnea on exertion. Although co-ox improved with dobutamine his fatigue and dyspnea did not improve. Palliative care was consulted and pt wished to be made DNR.  Plan was to d/c home for several days prior to transfer to [facility] place, but pt continued to not do well symptomatically and it was decided he would not do well at home. Pt was weaned off dobutamine with decrease in Co-ox that gradually recovered to > 60% but he has remained symptomatic.    Pt had 5 L of UO overall with only a net change of -1L.  Due to worsening status, patient opted to be transferred to  [facility] under [facility] via Ambulance.  Pt understands his prognosis.  Discharge Weight Range: 128 lb Discharge Vitals: Blood pressure 83/68, pulse 102, temperature 97.3 F (36.3 C), temperature source Oral, resp. rate 22, height 5' 9"  (1.753 m), weight 128 lb (58.06 kg), SpO2 98 %.  Labs: Lab Results  Component Value Date   WBC 2.9* 05/11/2015   HGB 10.6* 05/11/2015   HCT 31.6* 05/11/2015   MCV 85.6 05/11/2015   PLT 143* 05/11/2015    Recent Labs Lab 05/16/15 0440  NA 134*  K 4.1  CL 96*  CO2 27  BUN 18  CREATININE 1.29*  CALCIUM 9.7  GLUCOSE 113*   Lab Results  Component Value Date   CHOL 152 02/04/2015   HDL 39* 02/04/2015   LDLCALC 102* 02/04/2015   TRIG 57 02/04/2015   BNP (last 3 results)  Recent Labs  12/19/14 1633 02/03/15 1707 05/07/15 0002  BNP 486.7* 621.5* 1028.4*    ProBNP (last 3 results) No results for input(s): PROBNP in the last 8760 hours.   Diagnostic Studies/Procedures   No results found.  Discharge Medications     Medication List    ASK your doctor about these medications        clopidogrel 75 MG tablet  Commonly known as:  PLAVIX  Take 75 mg by mouth daily.     darunavir-cobicistat 800-150 MG per tablet  Commonly known as:  PREZCOBIX  Take 1 tablet by mouth  daily. Swallow whole. Do NOT crush, break or chew tablets. Take with food.     emtricitabine-tenofovir 200-300 MG per tablet  Commonly known as:  TRUVADA  Take 1 tablet by mouth daily.     HYDROcodone-acetaminophen 10-325 MG per tablet  Commonly known as:  NORCO  Take 1 tablet by mouth 2 (two) times daily as needed for moderate pain.        Disposition   The patient will be discharged to [facility] under [place].       Duration of Discharge Encounter: Greater than 35 minutes   Signed, Shirley Friar PA-C 05/16/2015, 9:00 AM  Patient seen and examined with Oda Kilts, PA-C. We discussed all aspects of the encounter. I agree with  the assessment and plan as stated above.   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   [place],MD 9:43 AM

## 2015-05-16 NOTE — Progress Notes (Signed)
Patient ID: Rick Potter, male   DOB: 1946/03/03, 69 y.o.   MRN: [id number]   SUBJECTIVE:  6/3 had abdominal fat pad biopsy and PICC placement. On Daily 80 mg oral lasix. CVP 10.   CO-OX 61% off dobutamine, but still feeling worse symptomatically.  Complains of fatigue and mild dyspnea.  Lying flat without difficulty on my arrival.  Says he feels about the same as yesterday, which was worse than previous days.   Scheduled Meds: . albuterol  2.5 mg Nebulization Once  . clopidogrel  75 mg Oral Daily  . dronabinol  2.5 mg Oral BID AC  . enoxaparin (LOVENOX) injection  40 mg Subcutaneous Q24H  . feeding supplement (RESOURCE BREEZE)  1 Container Oral TID BM  . furosemide  80 mg Oral Daily  . heparin lock flush  500 Units Intracatheter Q30 days  . oxyCODONE  5 mg Oral Q6H  . potassium chloride  40 mEq Oral Daily  . senna-docusate  1 tablet Oral BID  . sodium chloride  10-40 mL Intracatheter Q12H  . sodium chloride  3 mL Intravenous Q12H   Continuous Infusions: . sodium chloride Stopped (05/15/15 0930)   PRN Meds:.heparin lock flush **AND** heparin lock flush, ondansetron (ZOFRAN) IV, oxyCODONE    Filed Vitals:   05/15/15 1644 05/15/15 2000 05/16/15 0000 05/16/15 0400  BP: 86/69 91/35 67/50  69/51  Pulse: 102     Temp: 98.1 F (36.7 C) 97.2 F (36.2 C) 98 F (36.7 C) 97.6 F (36.4 C)  TempSrc: Oral Axillary Oral Oral  Resp: 19 14 16 16   Height:      Weight:      SpO2: 99% 99% 98% 99%    Intake/Output Summary (Last 24 hours) at 05/16/15 0723 Last data filed at 05/16/15 0600  Gross per 24 hour  Intake  330.6 ml  Output    200 ml  Net  130.6 ml    LABS: Basic Metabolic Panel:  Recent Labs  05/15/15 0430 05/16/15 0440  NA 133* 134*  K 3.8 4.1  CL 96* 96*  CO2 28 27  GLUCOSE 100* 113*  BUN 12 18  CREATININE 1.21 1.29*  CALCIUM 9.5 9.7   Liver Function Tests: No results for input(s): AST, ALT, ALKPHOS, BILITOT, PROT, ALBUMIN in the last 72 hours. No results  for input(s): LIPASE, AMYLASE in the last 72 hours. CBC: No results for input(s): WBC, NEUTROABS, HGB, HCT, MCV, PLT in the last 72 hours. Cardiac Enzymes: No results for input(s): CKTOTAL, CKMB, CKMBINDEX, TROPONINI in the last 72 hours. BNP: Invalid input(s): POCBNP D-Dimer: No results for input(s): DDIMER in the last 72 hours. Hemoglobin A1C: No results for input(s): HGBA1C in the last 72 hours. Fasting Lipid Panel: No results for input(s): CHOL, HDL, LDLCALC, TRIG, CHOLHDL, LDLDIRECT in the last 72 hours. Thyroid Function Tests: No results for input(s): TSH, T4TOTAL, T3FREE, THYROIDAB in the last 72 hours.  Invalid input(s): FREET3 Anemia Panel: No results for input(s): VITAMINB12, FOLATE, FERRITIN, TIBC, IRON, RETICCTPCT in the last 72 hours.  RADIOLOGY: Dg Chest 2 View  05/07/2015   CLINICAL DATA:  Shortness of breath for 3 days.  EXAM: CHEST  2 VIEW  COMPARISON:  04/14/2015.  CT 02/03/2015  FINDINGS: Tip of the right chest port remains in the SVC. Unchanged dual lead left-sided pacemaker. There is unchanged elevation of right hemidiaphragm. Minimal linear atelectasis at the right lung base. Cardiomediastinal contours are unchanged. No confluent consolidation, large pleural effusion or pneumothorax. Postsurgical change in the lower  cervical spine palm partially included. No acute osseous abnormality. The lytic lesions on prior CT are not appreciated radiographically.  IMPRESSION: No acute pulmonary process.   Electronically Signed   By: Jeb Levering M.D.   On: 05/07/2015 01:16   Ir US Guide Vasc Access Right  05/12/2015   CLINICAL DATA:  Amylase  EXAM: RIGHT UPPER EXTREMITY PICC LINE PLACEMENT WITH ULTRASOUND AND FLUOROSCOPIC GUIDANCE. ULTRASOUND GUIDANCE FOR CORE FAT PAD BIOPSY.  FLUOROSCOPY TIME:  42 seconds  PROCEDURE: The patient was advised of the possible risks and complications and agreed to undergo the procedure. The patient was then brought to the angiographic suite for the  procedure.  The right arm was prepped with chlorhexidine, draped in the usual sterile fashion using maximum barrier technique (cap and mask, sterile gown, sterile gloves, large sterile sheet, hand hygiene and cutaneous antisepsis) and infiltrated locally with 1% Lidocaine.  Ultrasound demonstrated patency of the right basilic vein, and this was documented with an image. Under real-time ultrasound guidance, this vein was accessed with a 21 gauge micropuncture needle and image documentation was performed. A 0.018 wire was introduced in to the vein. Over this, a 5 [place] double lumen power PICC was advanced to the lower SVC/right atrial junction. Fluoroscopy during the procedure and fluoro spot radiograph confirms appropriate catheter position. The catheter was flushed and covered with a sterile dressing.  The right lower quadrant was prepped and draped in a sterile fashion. 1% lidocaine was utilized for local anesthesia. Under sonographic guidance, 5 18 gauge core biopsies of the subcutaneous fat were obtained.  COMPLICATIONS: None  LENGTH: 41 cm  IMPRESSION: Successful right arm power PICC line placement with ultrasound and fluoroscopic guidance. The catheter is ready for use.  Successful fat pad core biopsy.   Electronically Signed   By: Marybelle Killings M.D.   On: 05/12/2015 07:51   Ir US Guide Bx Asp/drain  05/12/2015   CLINICAL DATA:  Amylase  EXAM: RIGHT UPPER EXTREMITY PICC LINE PLACEMENT WITH ULTRASOUND AND FLUOROSCOPIC GUIDANCE. ULTRASOUND GUIDANCE FOR CORE FAT PAD BIOPSY.  FLUOROSCOPY TIME:  42 seconds  PROCEDURE: The patient was advised of the possible risks and complications and agreed to undergo the procedure. The patient was then brought to the angiographic suite for the procedure.  The right arm was prepped with chlorhexidine, draped in the usual sterile fashion using maximum barrier technique (cap and mask, sterile gown, sterile gloves, large sterile sheet, hand hygiene and cutaneous antisepsis) and  infiltrated locally with 1% Lidocaine.  Ultrasound demonstrated patency of the right basilic vein, and this was documented with an image. Under real-time ultrasound guidance, this vein was accessed with a 21 gauge micropuncture needle and image documentation was performed. A 0.018 wire was introduced in to the vein. Over this, a 5 [place] double lumen power PICC was advanced to the lower SVC/right atrial junction. Fluoroscopy during the procedure and fluoro spot radiograph confirms appropriate catheter position. The catheter was flushed and covered with a sterile dressing.  The right lower quadrant was prepped and draped in a sterile fashion. 1% lidocaine was utilized for local anesthesia. Under sonographic guidance, 5 18 gauge core biopsies of the subcutaneous fat were obtained.  COMPLICATIONS: None  LENGTH: 41 cm  IMPRESSION: Successful right arm power PICC line placement with ultrasound and fluoroscopic guidance. The catheter is ready for use.  Successful fat pad core biopsy.   Electronically Signed   By: Marybelle Killings M.D.   On: 05/12/2015 07:51   Ir Fluoro  Guide Cv Midline Picc Right  05/12/2015   CLINICAL DATA:  Amylase  EXAM: RIGHT UPPER EXTREMITY PICC LINE PLACEMENT WITH ULTRASOUND AND FLUOROSCOPIC GUIDANCE. ULTRASOUND GUIDANCE FOR CORE FAT PAD BIOPSY.  FLUOROSCOPY TIME:  42 seconds  PROCEDURE: The patient was advised of the possible risks and complications and agreed to undergo the procedure. The patient was then brought to the angiographic suite for the procedure.  The right arm was prepped with chlorhexidine, draped in the usual sterile fashion using maximum barrier technique (cap and mask, sterile gown, sterile gloves, large sterile sheet, hand hygiene and cutaneous antisepsis) and infiltrated locally with 1% Lidocaine.  Ultrasound demonstrated patency of the right basilic vein, and this was documented with an image. Under real-time ultrasound guidance, this vein was accessed with a 21 gauge  micropuncture needle and image documentation was performed. A 0.018 wire was introduced in to the vein. Over this, a 5 [place] double lumen power PICC was advanced to the lower SVC/right atrial junction. Fluoroscopy during the procedure and fluoro spot radiograph confirms appropriate catheter position. The catheter was flushed and covered with a sterile dressing.  The right lower quadrant was prepped and draped in a sterile fashion. 1% lidocaine was utilized for local anesthesia. Under sonographic guidance, 5 18 gauge core biopsies of the subcutaneous fat were obtained.  COMPLICATIONS: None  LENGTH: 41 cm  IMPRESSION: Successful right arm power PICC line placement with ultrasound and fluoroscopic guidance. The catheter is ready for use.  Successful fat pad core biopsy.   Electronically Signed   By: Marybelle Killings M.D.   On: 05/12/2015 07:51    PHYSICAL EXAM CVP 3  General: Cachetic frail appearing + temporal wating. NAD Sitting on the side of the bed.  Neck: JVP to jaw. No thyromegaly or thyroid nodule.  Lungs: Clear to auscultation bilaterally with normal respiratory effort. CV: Nondisplaced PMI.  Tachy regular S1/S2, palpable s3, no murmur.  0-1+ edema to knees. Abdomen: Soft, nontender, + liver edge down no distention.  Neurologic: Alert and oriented x 3.  Psych: Normal affect. Extremities: No clubbing or cyanosis.   TELEMETRY: Reviewed telemetry pt in NSR 100s  ASSESSMENT AND PLAN: 69 yo with history of HIV and multiple myeloma. 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 He was admitted in 2/16 with chest pain, ?myopericarditis with mild troponin rise. LHC showed no significant coronary disease.Echo this admission showed EF 30% with diffuse hypokinesis (worse than prior). 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 IVC was very dilated. He has been persistently hypotensive.   1. Acute systolic CHF: EF about 17% by echo. LHC this  admit ok. Concerned that he may have co-existing amyloidosis with cardiac involvement given the appearance of his myocardium and his clinical presentation with CHF, nonischemic cardiomyopathy. Fat pad biopsy done 6/3 was negative.   Co-ox stable currently off dobutamine. 2. HIV: CD4 count > 200.  Seen by pulmonary.  On meds.  3. Multiple myeloma: End-stage Has been treated per heme/onc, Dr Alen Blew. He has been offVelcade due to side effects.  He stopped pomalidomide a few weeks ago.   4. Elevated troponin: Suspect demand ischemia from acute CHF. LHC in 2/16 without significant coronary disease.  5. Severe debility- PT following.  6. Severe protein calorie malnutrition 7. DNR.   Back on oral diuretics and has felt worse since decrease and c/d of dobutamine. Cr stable @ 1.29 Palliative Care appreciated. Now DNR.  Likely transfer directly to [facility]  if approved and bed available. Likely would not be able to support himself at home off dobutamine. Co-ox 61.1% this morning. CVP 10  Dispo: Discharge [facility] pending approval and bed availability.    Oda Kilts, PA-C 05/16/2015 7:23 AM  Advanced Heart Failure Team Pager [phone] (M-F; 7a - 4p)  Please contact [facility] for night-coverage after hours (4p -7a ) and weekends on [email or web address]   Patient seen and examined with Oda Kilts, PA-C. We discussed all aspects of the encounter. I agree with the assessment and plan as stated above.   He continues to decline with end stage HF and multiple myeloma. Volume status getting worse. Will give one dose IV lasix and switch to lasix 80 bid.   Await bed approval and bed availability at [facility].   Bensimhon, Daniel,MD 8:09 AM

## 2015-05-16 NOTE — Progress Notes (Signed)
Report called to [facility]. Receiving nurse, Colletta [place] denied any further questions at this time.

## 2015-06-06 ENCOUNTER — Encounter: Payer: Self-pay | Admitting: Cardiology

## 2015-06-06 ENCOUNTER — Encounter: Payer: Medicare Other | Admitting: Cardiology

## 2015-06-06 NOTE — Progress Notes (Signed)
This encounter was opened in error.   SIMMONS, BRITTAINY 06/06/2015

## 2015-06-06 DEATH — deceased

## 2015-07-02 IMAGING — XA IR US GUIDANCE
1 series · 1 of 1 positions shown · non-contrast
Comparison: none

CLINICAL DATA: Amylase

[Series 300: ir fluoro guide cv midline picc right · 1 of 1 slices shown]
[im 1/1]
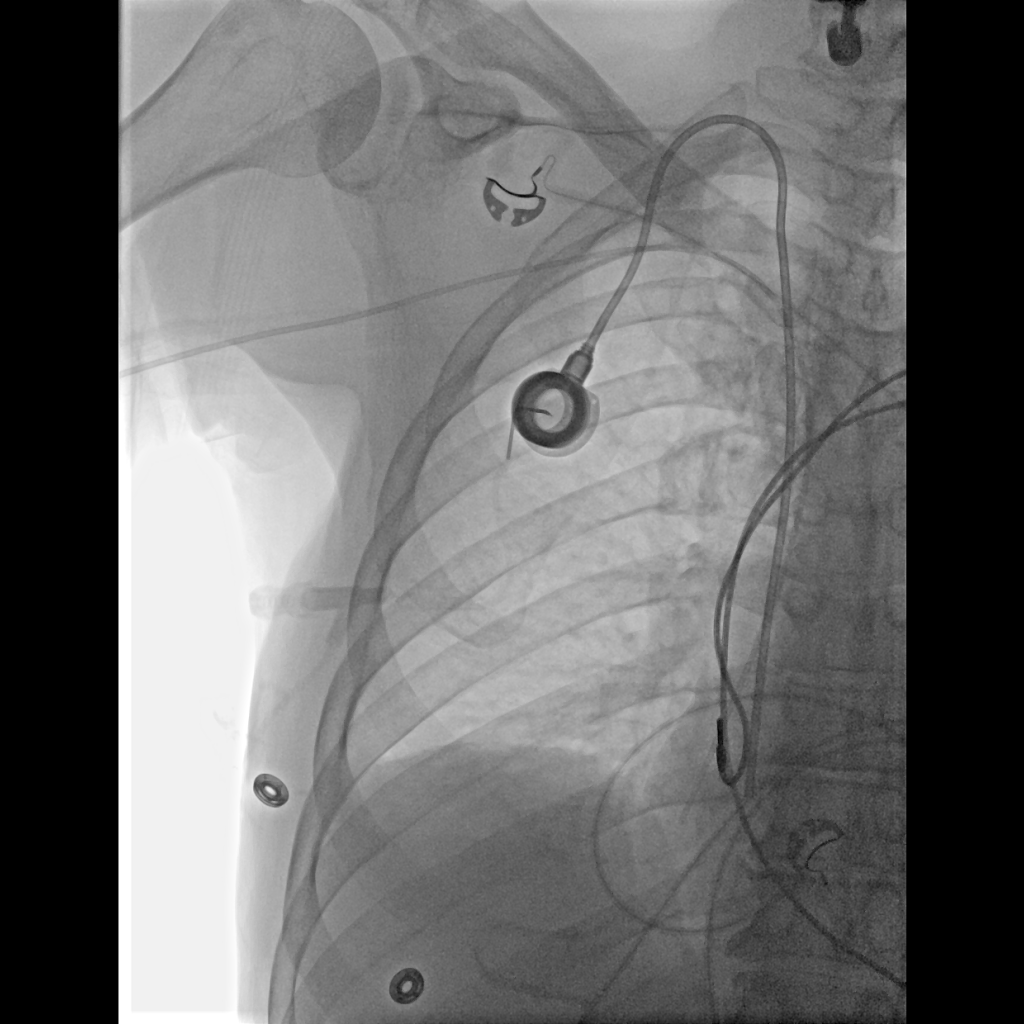

[1 of 1 positions shown; findings below may reference images not displayed]

EXAM:
RIGHT UPPER EXTREMITY PICC LINE PLACEMENT WITH ULTRASOUND AND
FLUOROSCOPIC GUIDANCE. ULTRASOUND GUIDANCE FOR CORE FAT PAD BIOPSY.

FLUOROSCOPY TIME:  42 seconds

PROCEDURE:
The patient was advised of the possible risks and complications and
agreed to undergo the procedure. The patient was then brought to the
angiographic suite for the procedure.

The right arm was prepped with chlorhexidine, draped in the usual
sterile fashion using maximum barrier technique (cap and mask,
sterile gown, sterile gloves, large sterile sheet, hand hygiene and
cutaneous antisepsis) and infiltrated locally with 1% Lidocaine.

Ultrasound demonstrated patency of the right basilic vein, and this
was documented with an image. Under real-time ultrasound guidance,
this vein was accessed with a 21 gauge micropuncture needle and
image documentation was performed. A [DATE] wire was introduced in to
the vein. Over this, a 5 [place] double lumen power PICC was advanced
to the lower SVC/right atrial junction. Fluoroscopy during the
procedure and fluoro spot radiograph confirms appropriate catheter
position. The catheter was flushed and covered with a sterile
dressing.

The right lower quadrant was prepped and draped in a sterile
fashion. 1% lidocaine was utilized for local anesthesia. Under
sonographic guidance, 5 18 gauge core biopsies of the subcutaneous
fat were obtained.

COMPLICATIONS:
None

LENGTH:
41 cm
IMPRESSION: Successful right arm power PICC line placement with ultrasound and
fluoroscopic guidance. The catheter is ready for use.

Successful fat pad core biopsy.

## 2016-05-21 ENCOUNTER — Other Ambulatory Visit: Payer: Self-pay | Admitting: Nurse Practitioner
